# Patient Record
Sex: Female | Born: 1935 | Race: White | Hispanic: No | Smoking: Never smoker
Health system: Southern US, Community
[De-identification: ages and names within clinical notes are randomized; demographics above are authoritative.]

## PROBLEM LIST (undated history)

## (undated) DIAGNOSIS — F329 Major depressive disorder, single episode, unspecified: Secondary | ICD-10-CM

## (undated) DIAGNOSIS — I4892 Unspecified atrial flutter: Secondary | ICD-10-CM

## (undated) DIAGNOSIS — J449 Chronic obstructive pulmonary disease, unspecified: Secondary | ICD-10-CM

## (undated) DIAGNOSIS — I10 Essential (primary) hypertension: Secondary | ICD-10-CM

## (undated) DIAGNOSIS — I071 Rheumatic tricuspid insufficiency: Secondary | ICD-10-CM

## (undated) DIAGNOSIS — I779 Disorder of arteries and arterioles, unspecified: Secondary | ICD-10-CM

## (undated) DIAGNOSIS — I480 Paroxysmal atrial fibrillation: Secondary | ICD-10-CM

## (undated) DIAGNOSIS — I351 Nonrheumatic aortic (valve) insufficiency: Secondary | ICD-10-CM

## (undated) DIAGNOSIS — N289 Disorder of kidney and ureter, unspecified: Secondary | ICD-10-CM

## (undated) DIAGNOSIS — N390 Urinary tract infection, site not specified: Secondary | ICD-10-CM

## (undated) DIAGNOSIS — I272 Pulmonary hypertension, unspecified: Secondary | ICD-10-CM

## (undated) DIAGNOSIS — K589 Irritable bowel syndrome without diarrhea: Secondary | ICD-10-CM

## (undated) DIAGNOSIS — R42 Dizziness and giddiness: Secondary | ICD-10-CM

## (undated) DIAGNOSIS — K219 Gastro-esophageal reflux disease without esophagitis: Secondary | ICD-10-CM

## (undated) DIAGNOSIS — N182 Chronic kidney disease, stage 2 (mild): Secondary | ICD-10-CM

## (undated) DIAGNOSIS — I447 Left bundle-branch block, unspecified: Secondary | ICD-10-CM

## (undated) DIAGNOSIS — I4891 Unspecified atrial fibrillation: Secondary | ICD-10-CM

## (undated) HISTORY — DX: Urinary tract infection, site not specified: N39.0

## (undated) HISTORY — DX: Unspecified atrial fibrillation (CMS-HCC): I48.91

## (undated) HISTORY — PX: BILATERAL SALPINGOOPHORECTOMY: SHX1223

## (undated) HISTORY — PX: HYSTERECTOMY: SHX81

## (undated) HISTORY — PX: CHOLECYSTECTOMY: SHX55

## (undated) HISTORY — DX: Major depressive disorder, single episode, unspecified: F32.9

## (undated) HISTORY — PX: OTHER PROCEDURE: U1053

## (undated) HISTORY — PX: OTHER SURGICAL HISTORY: SHX169

## (undated) HISTORY — DX: Nonrheumatic aortic (valve) insufficiency: I35.1

## (undated) HISTORY — DX: Disorder of arteries and arterioles, unspecified: I77.9

## (undated) HISTORY — DX: Rheumatic tricuspid insufficiency: I07.1

## (undated) HISTORY — PX: ABDOMINAL HYSTERECTOMY: SHX81

## (undated) HISTORY — DX: Pulmonary hypertension, unspecified: I27.20

## (undated) HISTORY — DX: Chronic kidney disease, stage 2 (mild): N18.2

## (undated) HISTORY — DX: Left bundle-branch block, unspecified: I44.7

## (undated) HISTORY — PX: JOINT REPLACEMENT: SHX530

## (undated) HISTORY — DX: Essential (primary) hypertension: I10

## (undated) HISTORY — DX: Paroxysmal atrial fibrillation: I48.0

## (undated) HISTORY — DX: Dizziness and giddiness: R42

---

## 2016-09-06 DIAGNOSIS — R05 Cough: Secondary | ICD-10-CM | POA: Diagnosis not present

## 2016-09-06 DIAGNOSIS — J101 Influenza due to other identified influenza virus with other respiratory manifestations: Secondary | ICD-10-CM | POA: Diagnosis not present

## 2016-09-06 DIAGNOSIS — J181 Lobar pneumonia, unspecified organism: Secondary | ICD-10-CM | POA: Diagnosis not present

## 2016-09-12 DIAGNOSIS — J181 Lobar pneumonia, unspecified organism: Secondary | ICD-10-CM | POA: Diagnosis not present

## 2016-09-12 DIAGNOSIS — R05 Cough: Secondary | ICD-10-CM | POA: Diagnosis not present

## 2016-09-15 DIAGNOSIS — R5383 Other fatigue: Secondary | ICD-10-CM | POA: Diagnosis not present

## 2016-09-15 DIAGNOSIS — J189 Pneumonia, unspecified organism: Secondary | ICD-10-CM | POA: Diagnosis not present

## 2016-09-15 DIAGNOSIS — M150 Primary generalized (osteo)arthritis: Secondary | ICD-10-CM | POA: Diagnosis not present

## 2016-09-15 DIAGNOSIS — R0609 Other forms of dyspnea: Secondary | ICD-10-CM | POA: Diagnosis not present

## 2016-09-15 DIAGNOSIS — I10 Essential (primary) hypertension: Secondary | ICD-10-CM | POA: Diagnosis not present

## 2016-09-29 DIAGNOSIS — J449 Chronic obstructive pulmonary disease, unspecified: Secondary | ICD-10-CM | POA: Diagnosis not present

## 2016-09-29 DIAGNOSIS — R5383 Other fatigue: Secondary | ICD-10-CM | POA: Diagnosis not present

## 2016-09-29 DIAGNOSIS — M1990 Unspecified osteoarthritis, unspecified site: Secondary | ICD-10-CM | POA: Diagnosis not present

## 2016-09-29 DIAGNOSIS — M150 Primary generalized (osteo)arthritis: Secondary | ICD-10-CM | POA: Diagnosis not present

## 2016-09-29 DIAGNOSIS — Z79810 Long term (current) use of selective estrogen receptor modulators (SERMs): Secondary | ICD-10-CM | POA: Diagnosis not present

## 2016-09-29 DIAGNOSIS — R634 Abnormal weight loss: Secondary | ICD-10-CM | POA: Diagnosis not present

## 2016-09-29 DIAGNOSIS — I10 Essential (primary) hypertension: Secondary | ICD-10-CM | POA: Diagnosis not present

## 2016-10-06 DIAGNOSIS — H8110 Benign paroxysmal vertigo, unspecified ear: Secondary | ICD-10-CM | POA: Diagnosis not present

## 2016-10-06 DIAGNOSIS — R0609 Other forms of dyspnea: Secondary | ICD-10-CM | POA: Diagnosis not present

## 2016-10-06 DIAGNOSIS — I10 Essential (primary) hypertension: Secondary | ICD-10-CM | POA: Diagnosis not present

## 2016-10-06 DIAGNOSIS — M150 Primary generalized (osteo)arthritis: Secondary | ICD-10-CM | POA: Diagnosis not present

## 2016-10-06 DIAGNOSIS — R296 Repeated falls: Secondary | ICD-10-CM | POA: Diagnosis not present

## 2016-10-06 DIAGNOSIS — N19 Unspecified kidney failure: Secondary | ICD-10-CM | POA: Diagnosis not present

## 2016-10-20 DIAGNOSIS — L603 Nail dystrophy: Secondary | ICD-10-CM | POA: Diagnosis not present

## 2016-10-20 DIAGNOSIS — M25775 Osteophyte, left foot: Secondary | ICD-10-CM | POA: Diagnosis not present

## 2016-10-20 DIAGNOSIS — M79671 Pain in right foot: Secondary | ICD-10-CM | POA: Diagnosis not present

## 2016-10-20 DIAGNOSIS — M79672 Pain in left foot: Secondary | ICD-10-CM | POA: Diagnosis not present

## 2016-10-20 DIAGNOSIS — M25774 Osteophyte, right foot: Secondary | ICD-10-CM | POA: Diagnosis not present

## 2016-10-20 DIAGNOSIS — M79676 Pain in unspecified toe(s): Secondary | ICD-10-CM | POA: Diagnosis not present

## 2016-10-20 DIAGNOSIS — M79674 Pain in right toe(s): Secondary | ICD-10-CM | POA: Diagnosis not present

## 2016-10-20 DIAGNOSIS — L600 Ingrowing nail: Secondary | ICD-10-CM | POA: Diagnosis not present

## 2016-10-20 DIAGNOSIS — M79675 Pain in left toe(s): Secondary | ICD-10-CM | POA: Diagnosis not present

## 2016-11-11 DIAGNOSIS — J209 Acute bronchitis, unspecified: Secondary | ICD-10-CM | POA: Diagnosis not present

## 2016-11-13 DIAGNOSIS — J209 Acute bronchitis, unspecified: Secondary | ICD-10-CM | POA: Diagnosis not present

## 2016-11-13 DIAGNOSIS — M150 Primary generalized (osteo)arthritis: Secondary | ICD-10-CM | POA: Diagnosis not present

## 2016-11-13 DIAGNOSIS — R296 Repeated falls: Secondary | ICD-10-CM | POA: Diagnosis not present

## 2016-11-13 DIAGNOSIS — N183 Chronic kidney disease, stage 3 (moderate): Secondary | ICD-10-CM | POA: Diagnosis not present

## 2016-11-13 DIAGNOSIS — R0609 Other forms of dyspnea: Secondary | ICD-10-CM | POA: Diagnosis not present

## 2016-11-13 DIAGNOSIS — I10 Essential (primary) hypertension: Secondary | ICD-10-CM | POA: Diagnosis not present

## 2016-12-20 DIAGNOSIS — H6120 Impacted cerumen, unspecified ear: Secondary | ICD-10-CM | POA: Diagnosis not present

## 2016-12-20 DIAGNOSIS — R51 Headache: Secondary | ICD-10-CM | POA: Diagnosis not present

## 2016-12-20 DIAGNOSIS — J309 Allergic rhinitis, unspecified: Secondary | ICD-10-CM | POA: Diagnosis not present

## 2016-12-20 DIAGNOSIS — R0982 Postnasal drip: Secondary | ICD-10-CM | POA: Diagnosis not present

## 2016-12-20 DIAGNOSIS — J343 Hypertrophy of nasal turbinates: Secondary | ICD-10-CM | POA: Diagnosis not present

## 2016-12-20 DIAGNOSIS — H9319 Tinnitus, unspecified ear: Secondary | ICD-10-CM | POA: Diagnosis not present

## 2016-12-20 DIAGNOSIS — J329 Chronic sinusitis, unspecified: Secondary | ICD-10-CM | POA: Diagnosis not present

## 2017-01-15 DIAGNOSIS — M9901 Segmental and somatic dysfunction of cervical region: Secondary | ICD-10-CM | POA: Diagnosis not present

## 2017-01-15 DIAGNOSIS — M542 Cervicalgia: Secondary | ICD-10-CM | POA: Diagnosis not present

## 2017-01-19 DIAGNOSIS — M542 Cervicalgia: Secondary | ICD-10-CM | POA: Diagnosis not present

## 2017-01-19 DIAGNOSIS — M9901 Segmental and somatic dysfunction of cervical region: Secondary | ICD-10-CM | POA: Diagnosis not present

## 2017-01-26 DIAGNOSIS — M9901 Segmental and somatic dysfunction of cervical region: Secondary | ICD-10-CM | POA: Diagnosis not present

## 2017-01-26 DIAGNOSIS — M542 Cervicalgia: Secondary | ICD-10-CM | POA: Diagnosis not present

## 2017-02-02 DIAGNOSIS — M9901 Segmental and somatic dysfunction of cervical region: Secondary | ICD-10-CM | POA: Diagnosis not present

## 2017-02-02 DIAGNOSIS — M542 Cervicalgia: Secondary | ICD-10-CM | POA: Diagnosis not present

## 2017-03-29 DIAGNOSIS — J45909 Unspecified asthma, uncomplicated: Secondary | ICD-10-CM | POA: Diagnosis not present

## 2017-03-29 DIAGNOSIS — H8110 Benign paroxysmal vertigo, unspecified ear: Secondary | ICD-10-CM | POA: Diagnosis not present

## 2017-03-29 DIAGNOSIS — R0609 Other forms of dyspnea: Secondary | ICD-10-CM | POA: Diagnosis not present

## 2017-03-29 DIAGNOSIS — I10 Essential (primary) hypertension: Secondary | ICD-10-CM | POA: Diagnosis not present

## 2017-03-29 DIAGNOSIS — Z79899 Other long term (current) drug therapy: Secondary | ICD-10-CM | POA: Diagnosis not present

## 2017-03-29 DIAGNOSIS — R5383 Other fatigue: Secondary | ICD-10-CM | POA: Diagnosis not present

## 2017-03-29 DIAGNOSIS — M1990 Unspecified osteoarthritis, unspecified site: Secondary | ICD-10-CM | POA: Diagnosis not present

## 2017-03-29 DIAGNOSIS — N19 Unspecified kidney failure: Secondary | ICD-10-CM | POA: Diagnosis not present

## 2017-04-30 DIAGNOSIS — J45909 Unspecified asthma, uncomplicated: Secondary | ICD-10-CM | POA: Diagnosis not present

## 2017-04-30 DIAGNOSIS — D510 Vitamin B12 deficiency anemia due to intrinsic factor deficiency: Secondary | ICD-10-CM | POA: Diagnosis not present

## 2017-04-30 DIAGNOSIS — R5383 Other fatigue: Secondary | ICD-10-CM | POA: Diagnosis not present

## 2017-04-30 DIAGNOSIS — I10 Essential (primary) hypertension: Secondary | ICD-10-CM | POA: Diagnosis not present

## 2017-04-30 DIAGNOSIS — N390 Urinary tract infection, site not specified: Secondary | ICD-10-CM | POA: Diagnosis not present

## 2017-04-30 DIAGNOSIS — M150 Primary generalized (osteo)arthritis: Secondary | ICD-10-CM | POA: Diagnosis not present

## 2017-04-30 DIAGNOSIS — R0609 Other forms of dyspnea: Secondary | ICD-10-CM | POA: Diagnosis not present

## 2017-05-18 ENCOUNTER — Telehealth (HOSPITAL_BASED_OUTPATIENT_CLINIC_OR_DEPARTMENT_OTHER): Payer: Self-pay | Admitting: Surgical

## 2017-05-18 ENCOUNTER — Telehealth (HOSPITAL_BASED_OUTPATIENT_CLINIC_OR_DEPARTMENT_OTHER): Payer: Self-pay | Admitting: Urology

## 2017-05-18 NOTE — Telephone Encounter (Signed)
Patient is asking to schedule an appt with Dr. Hetty Blend.  She states she has severe stabbing pains in her bladder and has to wear a diaper because urine will start coming out.  Please advise if patient can be scheduled?    She states this all started when she had a cystoscopy about 2 years ago outside of [place].

## 2017-05-18 NOTE — Telephone Encounter (Signed)
Erie Noe- please confirm reason for visit with Urology.  If pt has blood in her urine or urinary retention then pt can schedule with any urologist at [facility] or [facility].    If pt is only having bladder pain; then pt should see Rodman Pickle or Jaclynn Major for initial work up.      Also Erie Noe noted pt had cystoscopy previously, we will need all urology outside records.

## 2017-05-18 NOTE — Telephone Encounter (Addendum)
Patient has been scheduled with Rodman Pickle 05/28/17.  Pt and Pt's niece state they do not want to have anything to do with pt's previous Urologist Dr. Manson Passey.  They are is asking if clinic may obtain medical notes and cystoscopy from Dr. Theora Gianotti office.  They can sign a release with [place] if needed and ask if it may be emailed to niece to have pt sign and fax back to clinic. Please contact patient to let her know how this can be done, call center can also reach out to patient if needed.    Meriam Sprague, niece can be reached at [phone].    Release form can be emailed to [email or web address]      Dr. Manson Passey, [facility]    [address]  [place], [place] [address]  Ph: [phone]

## 2017-05-18 NOTE — Telephone Encounter (Signed)
Called Courtney Fowler to let her know she can go to [employer] and go to medical records to obtain release form.  Walked Courtney Fowler through website and told her she can call me back if she has any questions.

## 2017-05-18 NOTE — Telephone Encounter (Signed)
Per protocol, it;s new patients responsibility to obtain medical records before appointment. If there's no sign consent we are not able to assist with getting records.       Patient needs to contact Dr. Langley Gauss office or print the [place] medical records off the web site     Routing to [place] as [place].

## 2017-05-25 ENCOUNTER — Telehealth (HOSPITAL_BASED_OUTPATIENT_CLINIC_OR_DEPARTMENT_OTHER): Payer: Self-pay | Admitting: Surgical

## 2017-05-25 ENCOUNTER — Encounter (HOSPITAL_BASED_OUTPATIENT_CLINIC_OR_DEPARTMENT_OTHER): Payer: Self-pay | Admitting: Surgical

## 2017-05-25 ENCOUNTER — Ambulatory Visit: Payer: Medicare Other | Attending: Surgical | Admitting: Surgical

## 2017-05-25 VITALS — BP 120/43 | HR 71 | Temp 97.6°F

## 2017-05-25 DIAGNOSIS — N390 Urinary tract infection, site not specified: Secondary | ICD-10-CM | POA: Insufficient documentation

## 2017-05-25 DIAGNOSIS — R3989 Other symptoms and signs involving the genitourinary system: Secondary | ICD-10-CM | POA: Insufficient documentation

## 2017-05-25 DIAGNOSIS — N3946 Mixed incontinence: Secondary | ICD-10-CM | POA: Diagnosis not present

## 2017-05-25 DIAGNOSIS — N952 Postmenopausal atrophic vaginitis: Secondary | ICD-10-CM | POA: Diagnosis not present

## 2017-05-25 LAB — URINALYSIS WITH CULTURE REFLEX, WHEN INDICATED
Bilirubin: NEGATIVE
Glucose: NEGATIVE
Ketones: NEGATIVE
Nitrite: NEGATIVE
RBC: >50 — AB (ref 0–?)
Specific Gravity: 1.012 (ref 1.002–1.030)
Urobilinogen: NEGATIVE
WBC: >50 — AB (ref 0–?)
pH: 6 (ref 5.0–8.0)

## 2017-05-25 MED ORDER — XARELTO PO: ORAL | Status: AC

## 2017-05-25 MED ORDER — ESCITALOPRAM OXALATE 20 MG OR TABS: 20.00 mg | ORAL_TABLET | Freq: Every day | ORAL | Status: AC

## 2017-05-25 MED ORDER — CEPHALEXIN 500 MG OR CAPS: 500.00 mg | ORAL_CAPSULE | Freq: Four times a day (QID) | ORAL | Status: AC

## 2017-05-25 MED ORDER — ESTRADIOL 0.1 MG/GM VA CREA
TOPICAL_CREAM | VAGINAL | 11 refills | Status: AC
Start: 2017-05-25 — End: ?

## 2017-05-25 NOTE — Progress Notes (Signed)
INITIAL UROLOGY CONSULTATION    CC: LUTS, UTIs    History of Present Illness:  Courtney Fowler is a 83 year old female who was referred by Self, Referred is here today for evaluation of LUTS, UTIs    Patient states that she began having urinary incontinence.  Was sent to see Dr. Langley Gauss 2 years ago.  She had a cystoscopy there and states that it was excessively painful.      Pt states that she has had stress incontinence for years - previously only with coughing and sneezing.  After her cystoscopy she needed to wear pull ups and pads inside.  She leaks now not only with stress but also with urge.  Sometimes doesn't even know when she is about to leak.      Changes diaper/pad 3 times a day and they are soaked.  She does wear one at night and when she wakes up it is soaked.  She does get urgency and frequency during the day but as soon as she stands up to go urinate she leaks.      She has been having urethral pain since the cystoscopy.  Sometimes can be stabbing pains that make her halt.  Sometimes has burning with urination.  She does use a topical barrier cream which helps.  Prior to the cystoscopy she never had this kind of pain.  No blood in the urine. She does not know the results of the cystoscopy.      Patient is also bothered by urinary tract infections.  She has had positive urine cutlures per their report.  Just finished a course of keflex.  Dr. Lorna Few, her PCP treats her for these.  She previously was on ppx abx.  She previously tried vaginal estrogen but this was years ago.    She has been given myrbetriq but doesn't think this helps her.  She has been taking it for 2 years.     When her youngest daughter was born, she was told she had some type of bladder suspension procedure.     G4P3, all vaginal deliveries.  Incontinence started may years later.        PMH:  Past Medical History:   Diagnosis Date    AF (atrial fibrillation) (CMS-HCC)     Major depressive disorder, single episode      UTI (urinary tract infection)      There is no problem list on file for this patient.    PSH:  Past Surgical History:   Procedure Laterality Date    BILATERAL SALPINGOOPHORECTOMY      brain cyst removal      CHOLECYSTECTOMY      hip replacement      HYSTERECTOMY       Allergies   Allergen Reactions    Levaquin [Levofloxacin] Itching    Sulfa Drugs Itching     Social History     Social History    Marital status: Widowed     Spouse name: N/A    Number of children: N/A    Years of education: N/A     Occupational History    Not on file.     Social History Main Topics    Smoking status: Never Smoker    Smokeless tobacco: Never Used    Alcohol use Not on file    Drug use: Not on file    Sexual activity: Not on file     Other Topics Concern    Not  on file     Social History Narrative    No narrative on file     Family History   Problem Relation Age of Onset    Heart Disease Mother     Cancer Mother      leukemia    Stroke Father     Cancer Brother      esophageal    Heart Disease Brother          Current Outpatient Prescriptions   Medication Sig    cephALEXin (KEFLEX) 500 MG capsule Take 500 mg by mouth 4 times daily.    escitalopram (LEXAPRO) 20 MG tablet Take 20 mg by mouth daily.    estradiol (ESTRACE VAGINAL) 0.1 MG/GM vaginal cream Use every day for 2 weeks and then three times per week thereafter    Rivaroxaban (XARELTO PO)      No current facility-administered medications for this visit.         Review of Systems:   A complete ROS was performed and is negative except as documented above in HPI.      Physical Exam:   Vitals:    05/25/17 0844   BP: 120/43   BP Location: Left arm   BP Patient Position: Sitting   BP cuff size: Large   Pulse: 71   Temp: 97.6 F (36.4 C)     GENERAL: The patient is an alert, cooperative female in no acute distress.   HEAD/NECK: normalcephalic/atraumatic; midline trachea.   PULM: unlabored breathing on room air without coughing or wheezing  GI: soft, NT, ND, no  masses or hepatosplenomegaly appreciated  BACK: no CVAT or midline tenderness  EXTREMITIES: No redness or swelling.   SKIN: There is no edema or cyanosis.   NEURO: Alert and Oriented x 3, normal gait, ambulates without assistive device.  GU FEMALE: Normal external genitalia without rash or lesions.  Loss of labia minora and small introitus, mucosal changes consistent with severe mucosal atrophy, Urethral meatus noted in normal anatomic position.  Manual exam painful for patient, no masses.     Results:   PVR: 54mL  All pertinent lab results and imaging have been reviewed with this patient.      Assessment and Plan:    ICD-10-CM ICD-9-CM   1. Vaginal atrophy N95.2 627.3   2. Recurrent UTI N39.0 599.0   3. Mixed incontinence N39.46 41.33     83 y/o F with h/o recurrent UTIs, mixed urinary incontinence that is worse over the last two years, here for evaluation.  We discussed today that she likely is having both stress and urge incontinence, though at this time I am not sure which is the primary.  As she has failed a trial of mirabegron and doesn't feel this has helped at all, I recommended a urodynamic study although after describing the procedure patient states she thinks she had this with Dr. Irving Burton office.  Will get these records so as not to repeat the study if not needed.     In the interim, given her urethral and vaginal pain and recurrent UTIs, recommend restarting vaginal estrogen.  She tells me that in the past she used Estring and liked it but based on her exam today I'm not sure she would tolerate the placement of the Estring at this time.  Recommend Estrace for 6 weeks and will re-examine. If able to tolerate manual exam, can switch to Estring.     We discussed today that vaginal estrogen can improve symptoms of  urinary urgency and urgency incontinence as well.  This probably will not keep her dry but is a tool we can use.  On that note, we discussed that I do not want to promise that I will get her to be  dry, but that we will work toward improving her incontinence as much as possible.      Finally, placed a standing urine culture for patient, they will get this done locally in [place] and send me a message to let me know when to look out for results.     Plan:  Standing urine cutlure  Trial of Estrace  Obtain records from Dr. Theora Gianotti office  RTO in 6 weeks    Tonny Branch. Juliane Poot    [facility]  Urology [facility]  [address]  [place], [place] [address]

## 2017-05-25 NOTE — Addendum Note (Signed)
Addended by: Linward Headland on: 05/25/2017 03:16 PM     Modules accepted: Orders

## 2017-05-25 NOTE — Telephone Encounter (Signed)
Received message from PA. I contacted Dr. Janet Berlin office: [phone]. Requested for office visit notes, Cysto and UDS results.    Records have been received and under media for PA to review.

## 2017-05-28 ENCOUNTER — Telehealth (HOSPITAL_BASED_OUTPATIENT_CLINIC_OR_DEPARTMENT_OTHER): Payer: Self-pay | Admitting: Surgical

## 2017-05-28 ENCOUNTER — Ambulatory Visit (HOSPITAL_BASED_OUTPATIENT_CLINIC_OR_DEPARTMENT_OTHER): Payer: Medicare Other | Admitting: Surgical

## 2017-05-28 DIAGNOSIS — N3000 Acute cystitis without hematuria: Principal | ICD-10-CM

## 2017-05-28 LAB — URINE CULTURE

## 2017-05-28 MED ORDER — NITROFURANTOIN MONOHYD MACRO 100 MG OR CAPS
100.0000 mg | ORAL_CAPSULE | Freq: Two times a day (BID) | ORAL | 0 refills | Status: AC
Start: 2017-05-28 — End: ?

## 2017-05-28 NOTE — Telephone Encounter (Signed)
LVM for patient to call back to discuss UCX results.    If patient calls back - nursing to please relay following information:    Urine culture positive.  If patient is feeling well without UTI symptoms, no need for abx, however if she is having UTI symptoms, will prescribe abx to her pharmacy

## 2017-06-05 DIAGNOSIS — N19 Unspecified kidney failure: Secondary | ICD-10-CM | POA: Diagnosis not present

## 2017-06-05 DIAGNOSIS — E785 Hyperlipidemia, unspecified: Secondary | ICD-10-CM | POA: Diagnosis not present

## 2017-06-05 DIAGNOSIS — I10 Essential (primary) hypertension: Secondary | ICD-10-CM | POA: Diagnosis not present

## 2017-06-05 DIAGNOSIS — J209 Acute bronchitis, unspecified: Secondary | ICD-10-CM | POA: Diagnosis not present

## 2017-06-05 DIAGNOSIS — M150 Primary generalized (osteo)arthritis: Secondary | ICD-10-CM | POA: Diagnosis not present

## 2017-06-05 DIAGNOSIS — R0609 Other forms of dyspnea: Secondary | ICD-10-CM | POA: Diagnosis not present

## 2017-06-05 DIAGNOSIS — D649 Anemia, unspecified: Secondary | ICD-10-CM | POA: Diagnosis not present

## 2017-06-05 DIAGNOSIS — H8110 Benign paroxysmal vertigo, unspecified ear: Secondary | ICD-10-CM | POA: Diagnosis not present

## 2017-06-05 DIAGNOSIS — N390 Urinary tract infection, site not specified: Secondary | ICD-10-CM | POA: Diagnosis not present

## 2017-06-08 ENCOUNTER — Telehealth (HOSPITAL_BASED_OUTPATIENT_CLINIC_OR_DEPARTMENT_OTHER): Payer: Self-pay | Admitting: Surgical

## 2017-06-08 NOTE — Telephone Encounter (Signed)
Pt calling, states she believes Fleet Contras asked pt to schedule a cysto. There is no mention in last OV note 05/25/17. Please advise pt [phone]

## 2017-06-08 NOTE — Telephone Encounter (Signed)
Per LOV with Fleet Contras, [place] 05/25/17:  "83 y/o F with h/o recurrent UTIs, mixed urinary incontinence that is worse over the last two years, here for evaluation.  We discussed today that she likely is having both stress and urge incontinence, though at this time I am not sure which is the primary.  As she has failed a trial of mirabegron and doesn't feel this has helped at all, I recommended a urodynamic study although after describing the procedure patient states she thinks she had this with Dr. Irving Burton office.  Will get these records so as not to repeat the study if not needed."    There is UDS results in media on 05/25/17 from Dr. Theora Gianotti office. This may be what pt is referring to.

## 2017-06-12 DIAGNOSIS — H04123 Dry eye syndrome of bilateral lacrimal glands: Secondary | ICD-10-CM | POA: Diagnosis not present

## 2017-06-19 ENCOUNTER — Telehealth (HOSPITAL_BASED_OUTPATIENT_CLINIC_OR_DEPARTMENT_OTHER): Payer: Self-pay | Admitting: Surgical

## 2017-06-19 NOTE — Telephone Encounter (Signed)
Tried to call patient to relay PA Shapiro's message but no answer. Will try to call patient again later.

## 2017-06-19 NOTE — Telephone Encounter (Signed)
Spoke to Patient and relayed PA Shapiro's message   Hi - please let patient know I don't think we need to do a cystoscopy yet. I wanted to see her back after I received the records from Dr. Theora Gianotti office and after she tried the estradiol for a few weeks. She is to see me back on 11/2 at which time we will discuss next steps.

## 2017-06-20 DIAGNOSIS — J343 Hypertrophy of nasal turbinates: Secondary | ICD-10-CM | POA: Diagnosis not present

## 2017-06-20 DIAGNOSIS — R0982 Postnasal drip: Secondary | ICD-10-CM | POA: Diagnosis not present

## 2017-06-20 DIAGNOSIS — H6123 Impacted cerumen, bilateral: Secondary | ICD-10-CM | POA: Diagnosis not present

## 2017-06-20 DIAGNOSIS — R040 Epistaxis: Secondary | ICD-10-CM | POA: Diagnosis not present

## 2017-06-20 DIAGNOSIS — H9319 Tinnitus, unspecified ear: Secondary | ICD-10-CM | POA: Diagnosis not present

## 2017-06-20 DIAGNOSIS — J309 Allergic rhinitis, unspecified: Secondary | ICD-10-CM | POA: Diagnosis not present

## 2017-06-20 DIAGNOSIS — J329 Chronic sinusitis, unspecified: Secondary | ICD-10-CM | POA: Diagnosis not present

## 2017-07-02 DIAGNOSIS — B351 Tinea unguium: Secondary | ICD-10-CM | POA: Diagnosis not present

## 2017-07-02 DIAGNOSIS — M79672 Pain in left foot: Secondary | ICD-10-CM | POA: Diagnosis not present

## 2017-07-02 DIAGNOSIS — L851 Acquired keratosis [keratoderma] palmaris et plantaris: Secondary | ICD-10-CM | POA: Diagnosis not present

## 2017-07-02 DIAGNOSIS — M79671 Pain in right foot: Secondary | ICD-10-CM | POA: Diagnosis not present

## 2017-07-03 DIAGNOSIS — R0609 Other forms of dyspnea: Secondary | ICD-10-CM | POA: Diagnosis not present

## 2017-07-03 DIAGNOSIS — R5383 Other fatigue: Secondary | ICD-10-CM | POA: Diagnosis not present

## 2017-07-03 DIAGNOSIS — Z23 Encounter for immunization: Secondary | ICD-10-CM | POA: Diagnosis not present

## 2017-07-03 DIAGNOSIS — H8110 Benign paroxysmal vertigo, unspecified ear: Secondary | ICD-10-CM | POA: Diagnosis not present

## 2017-07-03 DIAGNOSIS — N19 Unspecified kidney failure: Secondary | ICD-10-CM | POA: Diagnosis not present

## 2017-07-03 DIAGNOSIS — I4892 Unspecified atrial flutter: Secondary | ICD-10-CM | POA: Diagnosis not present

## 2017-07-03 DIAGNOSIS — M150 Primary generalized (osteo)arthritis: Secondary | ICD-10-CM | POA: Diagnosis not present

## 2017-07-03 DIAGNOSIS — D510 Vitamin B12 deficiency anemia due to intrinsic factor deficiency: Secondary | ICD-10-CM | POA: Diagnosis not present

## 2017-07-03 DIAGNOSIS — Z79899 Other long term (current) drug therapy: Secondary | ICD-10-CM | POA: Diagnosis not present

## 2017-07-03 DIAGNOSIS — I10 Essential (primary) hypertension: Secondary | ICD-10-CM | POA: Diagnosis not present

## 2017-07-06 ENCOUNTER — Encounter (HOSPITAL_BASED_OUTPATIENT_CLINIC_OR_DEPARTMENT_OTHER): Payer: Self-pay | Admitting: Surgical

## 2017-07-06 ENCOUNTER — Ambulatory Visit: Payer: Medicare Other | Attending: Surgical | Admitting: Surgical

## 2017-07-06 VITALS — BP 131/62 | HR 85 | Temp 97.6°F | Resp 16 | Ht 63.5 in | Wt 135.0 lb

## 2017-07-06 DIAGNOSIS — R300 Dysuria: Secondary | ICD-10-CM | POA: Insufficient documentation

## 2017-07-06 DIAGNOSIS — N3941 Urge incontinence: Principal | ICD-10-CM | POA: Insufficient documentation

## 2017-07-06 DIAGNOSIS — N393 Stress incontinence (female) (male): Secondary | ICD-10-CM | POA: Diagnosis not present

## 2017-07-06 DIAGNOSIS — N390 Urinary tract infection, site not specified: Secondary | ICD-10-CM | POA: Diagnosis not present

## 2017-07-06 DIAGNOSIS — N952 Postmenopausal atrophic vaginitis: Secondary | ICD-10-CM | POA: Diagnosis not present

## 2017-07-06 MED ORDER — MIRABEGRON ER 50 MG PO TB24
50.00 mg | ORAL_TABLET | Freq: Every day | ORAL | 11 refills | Status: AC
Start: 2017-07-06 — End: ?

## 2017-07-06 NOTE — Progress Notes (Signed)
UROLOGY CLINIC NOTE     CC: LUTS, UTIs    History of Present Illness:  Courtney Fowler is a 83 year old female who is here today for continued evaluation of LUTS, UTIs    Patient bothered by mixed incontinence.   States she has had small volume stress incontinence for many years, had a bladder suspension procedure after birth of her youngest daughter.  In the last few years began developing more urgency incontinence.  Went to see Dr. Langley Gauss 02/2016.  She states that she had urodynamics and a cystoscopy.  The cystoscopy she describes as excessivly painful and feels that her incontinence worsened exponentially after this.  She was prescribed mirabegron by Dr. Manson Passey and has been taking 25mg  since that visit but she has not been back.  She doesn't feel that this helps her, but she isn't sure.    Changes diaper/pad 3 times a day and they are soaked.  She does wear one at night and when she wakes up it is soaked.  She does get urgency and frequency during the day but as soon as she stands up to go urinate she leaks.      She has been having urethral pain since the cystoscopy.  Sometimes can be stabbing pains that make her halt.  Sometimes has burning with urination.  She does use a topical barrier cream which helps.  Prior to the cystoscopy she never had this kind of pain.  No gross blood in the urine.     Patient is also bothered by urinary tract infections.  She has had positive urine cutlures per their report.  Just finished a course of keflex.  Dr. Lorna Few, her PCP treats her for these.  She previously was on ppx abx.  She previously tried vaginal estrogen but this was years ago.  She cannot tell me whether her symptoms improve on antibiotics.      Interval History  Last visit patient was started on vaginal estrogen.  She states she has been using the vaginal estrogen.  She was given a prescription for daily macrobid. She previously was on daily macrobid but doesn't recall if this helped.  She saw her  primary care doctor on Tuesday an was told to restart the daily macrobid although a urinalysis or culture were not done.      I received the records from Dr. Theora Gianotti office.  Pt had UDS performed 02/28/2016 - tracing revealed DO with   Incontinence and low capacity bladder (MCC 152cc), no stress incontinence noted.  She was filled at 30mL/min and the PFS is not included on the tracing.     Cystoscopy was also performed which was negative except for some mild posterior wall erythema.    She is still bothered by incontinence.  She can usually go 2-3 hours without urinating.  She is more bothered by large volume urgency incontinence.  Now only using a diaper and pad at night.    She is still not sure that mirabegron is helping her.    PMH:  Past Medical History:   Diagnosis Date    AF (atrial fibrillation) (CMS-HCC)     Major depressive disorder, single episode     UTI (urinary tract infection)      There is no problem list on file for this patient.    PSH:  Past Surgical History:   Procedure Laterality Date    BILATERAL SALPINGOOPHORECTOMY      brain cyst removal  CHOLECYSTECTOMY      hip replacement      HYSTERECTOMY       Allergies   Allergen Reactions    Levaquin [Levofloxacin] Itching    Sulfa Drugs Itching     Social History     Social History    Marital status: Widowed     Spouse name: N/A    Number of children: N/A    Years of education: N/A     Occupational History    Not on file.     Social History Main Topics    Smoking status: Never Smoker    Smokeless tobacco: Never Used    Alcohol use Not on file    Drug use: Not on file    Sexual activity: Not on file     Other Topics Concern    Not on file     Social History Narrative     Family History   Problem Relation Age of Onset    Heart Disease Mother     Cancer Mother      leukemia    Stroke Father     Cancer Brother      esophageal    Heart Disease Brother          Current Outpatient Prescriptions   Medication Sig    cephALEXin  (KEFLEX) 500 MG capsule Take 500 mg by mouth 4 times daily.    escitalopram (LEXAPRO) 20 MG tablet Take 20 mg by mouth daily.    estradiol (ESTRACE VAGINAL) 0.1 MG/GM vaginal cream Use every day for 2 weeks and then three times per week thereafter    mirabegron (MYRBETRIQ) 50 MG ER tablet Take 50 mg by mouth daily.    nitrofurantoin monohydrate (MACROBID) 100 MG capsule Take 1 capsule (100 mg) by mouth 2 times daily.    Rivaroxaban (XARELTO PO)      No current facility-administered medications for this visit.         Review of Systems:   A complete ROS was performed and is negative except as documented above in HPI.      Physical Exam:   Vitals:    07/06/17 1027   BP: 131/62   BP Patient Position: Sitting   BP cuff size: Regular   Pulse: 85   Resp: 16   Temp: 97.6 F (36.4 C)   TempSrc: Oral   Weight: 61.2 kg (135 lb)   Height: 5' 3.5" (1.613 m)     GENERAL: The patient is an alert, elderly, cooperative female in no acute distress.   HEAD/NECK: normalcephalic/atraumatic; midline trachea.   PULM: unlabored breathing on room air without coughing or wheezing  GI: soft, ND  EXTREMITIES: No redness or swelling.   SKIN: There is no edema or cyanosis.   NEURO: Alert and Oriented x 3, normal gait, ambulates without assistive device.    Results:   All pertinent lab results and imaging have been reviewed with this patient.      Assessment and Plan:    ICD-10-CM ICD-9-CM   1. Urgency incontinence N39.41 788.31   2. Stress incontinence N39.3 [id number]   3. Dysuria R30.0 788.1   4. Recurrent UTI N39.0 599.0   5. Atrophic vaginitis N95.2 627.3     83 y/o F with h/o recurrent UTIs, mixed urinary incontinence that is worse over the last two years, here for evaluation.  I had a lengthy discussion with patient and her family member today regarding her symptoms, potential etiologies and plans for  therapy.  We reviewed that at this time I am unclear as to whether her urinary tract infections are truly infections vs. Bacteriuria and  baseline irritative symptoms.  We discussed that this can take time to evaluate further and that for now she can continue on the daily macrobid but ultimately this is not a great long-term strategy.  I would like to have records of any urinalyses and urine cultures that are done so that we can monitor.  She was asked to leave a urine sample today but she was unable to - will go to [facility].     We reviewed that as she is most bothered by urgency incontinence, I would recommend continued treatment for this.  We discussed a trial off mirabegron to see if helping vs. Increasing the dose to see if she can get additional benefit.  She would like to increase the dose.  We also discussed other OAB therapies including addition of an anticholinergic medication for synergistic effect, PTNS, Interstim, and bladder botox.      Family member is very concerned that "damage" was done during her cystoscopy at Dr. Theora Gianotti office.  Reassured patient that it does not sound that way and at least from my external exam everything appeared normal.  I do not believe that a repeat cystoscopy is warranted at this time, particularly given how uncomfortable this procedure was for her in the past.  Would only repeat for cause and today I do not think that any findings would change my management.    Going forward, if we try a few different therapies and patient is not improving, would like to repeat her urodynamics study as her prior tracing did not include a PFS or information on emptying.     Finally, patient has a nephew with a connection to DR. Hetty Blend.  They would like to see her for their follow up visit.    Plan:  Continue vaginal estrogen  Urinalysis and urine culture at [facility]  Increase mirabegron to 50mg   RTO with voiding diary in 6 weeks     I spent over 45 min with this patinet, more than 50% of that time was spent in direct face to face counseling.    Tonny Branch. Juliane Poot     [facility]  Urology [facility]  [address], [address]  [place], [place] [address]

## 2017-07-11 DIAGNOSIS — N3941 Urge incontinence: Secondary | ICD-10-CM | POA: Diagnosis not present

## 2017-07-18 DIAGNOSIS — D500 Iron deficiency anemia secondary to blood loss (chronic): Secondary | ICD-10-CM | POA: Diagnosis not present

## 2017-07-18 DIAGNOSIS — K909 Intestinal malabsorption, unspecified: Secondary | ICD-10-CM | POA: Diagnosis not present

## 2017-07-25 ENCOUNTER — Telehealth (HOSPITAL_BASED_OUTPATIENT_CLINIC_OR_DEPARTMENT_OTHER): Payer: Self-pay

## 2017-07-25 NOTE — Telephone Encounter (Signed)
Spoke to pt's care-giver & discussed fact that void diary & past UCX records would be helpful at upcoming visit with Dr. Hetty BlendBuckley.

## 2017-07-31 ENCOUNTER — Telehealth (HOSPITAL_BASED_OUTPATIENT_CLINIC_OR_DEPARTMENT_OTHER): Payer: Self-pay | Admitting: Surgical

## 2017-07-31 NOTE — Telephone Encounter (Signed)
Pts EC Courtney Fowler is calling.  Pt saw PA Nile RiggsShapiro on 11/2 and was advised to return after completing a voiding diary.    Pt is currently scheduled 12/12.  Courtney Fowler asks that RN staff contact pt to discuss how to keep diary as pt is unsure of how to do it and for how long to do it.    Best contact ph  [phone]

## 2017-07-31 NOTE — Telephone Encounter (Signed)
Called and spoke to patient using 2 identifiers. Patient was instructed to keep a record/log of time and date of every intake and output for 2 days. Patient was also instructed to measure them accordingly.   Patient agreed and verbalized understanding.

## 2017-08-03 DIAGNOSIS — J181 Lobar pneumonia, unspecified organism: Secondary | ICD-10-CM | POA: Diagnosis not present

## 2017-08-03 DIAGNOSIS — I10 Essential (primary) hypertension: Secondary | ICD-10-CM | POA: Diagnosis not present

## 2017-08-03 DIAGNOSIS — R0609 Other forms of dyspnea: Secondary | ICD-10-CM | POA: Diagnosis not present

## 2017-08-03 DIAGNOSIS — D510 Vitamin B12 deficiency anemia due to intrinsic factor deficiency: Secondary | ICD-10-CM | POA: Diagnosis not present

## 2017-08-03 DIAGNOSIS — N19 Unspecified kidney failure: Secondary | ICD-10-CM | POA: Diagnosis not present

## 2017-08-03 DIAGNOSIS — R5383 Other fatigue: Secondary | ICD-10-CM | POA: Diagnosis not present

## 2017-08-03 DIAGNOSIS — R05 Cough: Secondary | ICD-10-CM | POA: Diagnosis not present

## 2017-08-03 DIAGNOSIS — H8110 Benign paroxysmal vertigo, unspecified ear: Secondary | ICD-10-CM | POA: Diagnosis not present

## 2017-08-03 DIAGNOSIS — E785 Hyperlipidemia, unspecified: Secondary | ICD-10-CM | POA: Diagnosis not present

## 2017-08-15 ENCOUNTER — Ambulatory Visit: Payer: Medicare Other | Attending: Urology | Admitting: Urology

## 2017-08-15 ENCOUNTER — Encounter (HOSPITAL_BASED_OUTPATIENT_CLINIC_OR_DEPARTMENT_OTHER): Payer: Self-pay | Admitting: Urology

## 2017-08-15 VITALS — BP 113/49 | HR 75 | Temp 98.0°F | Resp 16 | Ht 63.5 in | Wt 135.0 lb

## 2017-08-15 DIAGNOSIS — N3941 Urge incontinence: Secondary | ICD-10-CM | POA: Diagnosis not present

## 2017-08-15 DIAGNOSIS — N3281 Overactive bladder: Secondary | ICD-10-CM | POA: Diagnosis not present

## 2017-08-15 DIAGNOSIS — N390 Urinary tract infection, site not specified: Secondary | ICD-10-CM | POA: Diagnosis not present

## 2017-08-15 DIAGNOSIS — N952 Postmenopausal atrophic vaginitis: Secondary | ICD-10-CM | POA: Diagnosis not present

## 2017-08-15 NOTE — Progress Notes (Signed)
PCP: No Pcp, Per Patient  Date Today: 08/15/17     Chief complaint: LUTS, UTIs    HPI:   Nile RiggsJonita S Cheek-Eurich is a 83 year old female here for follow-up of LUTS and UTIs. Last saw Rodman PickleRachel Shapiro in 07/2017.  She conducted a very thorough evaluation which noted mixed urinary incontinence and possible recurrent UTIs vs bacteruria (currently on macrobid prophylaxis and vaginal estrogen).      Evaluation with Dr. Manson PasseyBrown (her prior urologist) included UDS and cysto.  He prescribed a trial of mirabegron which was not especially helpful. UDS showed DO and low capacity bladder. Cysto negative with exception of some posterior wall erythema which he did not feel warranted a biopsy.     Current symptoms: Notes using 2-3 pads per day and a brief at night.  Wakes quite wet.  She has severe dysuria with voiding.  Vaginal estrogen helped temporarily. She stopped empiric macrobid recently at the advice of her PCP due to renal insufficiency and the need for alternate antibiotic during treatment of a recent PNA.  Her goals of treatment are to first address her dysuria that has severely worsened after cystoscopy.     Medications:  Current Outpatient Medications on File Prior to Visit   Medication Sig Dispense Refill    cephALEXin (KEFLEX) 500 MG capsule Take 500 mg by mouth 4 times daily.      escitalopram (LEXAPRO) 20 MG tablet Take 20 mg by mouth daily.      estradiol (ESTRACE VAGINAL) 0.1 MG/GM vaginal cream Use every day for 2 weeks and then three times per week thereafter 1 Tube 11    mirabegron (MYRBETRIQ) 50 MG ER tablet Take 50 mg by mouth daily. 30 tablet 11    nitrofurantoin monohydrate (MACROBID) 100 MG capsule Take 1 capsule (100 mg) by mouth 2 times daily. 14 capsule 0    Rivaroxaban (XARELTO PO)        No current facility-administered medications on file prior to visit.      OBJECTIVE:  Vital Signs:  BP 113/49 (BP Location: Left arm, BP Patient Position: Sitting, BP cuff size: Regular)    Pulse 75    Temp 98 F  (36.7 C) (Oral)    Resp 16    Ht 5' 3.5" (1.613 m)    Wt 61.2 kg (135 lb)    SpO2 95%    BMI 23.54 kg/m     Physical:    GENERAL: Pleasant, cooperative and in no acute distress.   NEURO: Alert and Oriented x 3  HEENT: normalcephalic/atraumatic  NECK: supple, No LAD  RESP: non-labored breathing, no wheezing  BACK: No CVAT  GU: unchanged from last visit, cloudy urine  EXT: warm, no edema     ASSESSMENT:   83 year old female here for follow-up of mixed urinary incontinence and recurrent UTIs vs bacteruria.     PLAN:     - Will refill vaginal estrogen as it was helpful when used for dysuria.      I personally reviewed the patient's history, and interviewed and examined the patient. I agree with the documentation completed by the fellow. I reviewed her evaluation up until this point and agree with the course of action. She is currently trying second line therapy for OAB and some UI. She had a bad experience with a prior cystoscopy. At this point we will not repeat her cystoscopy. If she desire to have this repeated, I would be happy to do this in the future.  My sense is, she will likely require 3rd line OAB therapy.    Cristy FolksJill C. Hetty BlendBuckley, MD FACS

## 2017-08-16 ENCOUNTER — Telehealth (HOSPITAL_BASED_OUTPATIENT_CLINIC_OR_DEPARTMENT_OTHER): Payer: Self-pay | Admitting: Urology

## 2017-08-16 NOTE — Telephone Encounter (Signed)
Faxed to PCP

## 2017-08-16 NOTE — Telephone Encounter (Signed)
Patient is requesting urine test results that she brought in to her appointment with Dr Hetty BlendBuckley on 12/12 to be sent to her PCP, these are needed in order for her to get a refill on her medication     Please fax urine test to Dr Sabra Heckavid Chane   Fax # [phone]    If possible Patient is requesting this to be done this morning     Thank you

## 2017-08-17 ENCOUNTER — Encounter (HOSPITAL_BASED_OUTPATIENT_CLINIC_OR_DEPARTMENT_OTHER): Payer: Medicare Other | Admitting: Urology

## 2017-08-17 NOTE — Telephone Encounter (Signed)
Patient was informed by her PCP tht the UA results were not received. She is requesting at copy sent to her email. [email or web address] so that she can provide it to her PCP.     Preferred phone [phone]

## 2017-08-20 NOTE — Telephone Encounter (Signed)
E-mailed pt that no urine was sent at her app't 12/12 & that if she is experioencing Sx's to please call.

## 2017-08-23 DIAGNOSIS — R3981 Functional urinary incontinence: Secondary | ICD-10-CM | POA: Diagnosis not present

## 2017-08-23 DIAGNOSIS — D510 Vitamin B12 deficiency anemia due to intrinsic factor deficiency: Secondary | ICD-10-CM | POA: Diagnosis not present

## 2017-08-23 DIAGNOSIS — H8110 Benign paroxysmal vertigo, unspecified ear: Secondary | ICD-10-CM | POA: Diagnosis not present

## 2017-08-23 DIAGNOSIS — J449 Chronic obstructive pulmonary disease, unspecified: Secondary | ICD-10-CM | POA: Diagnosis not present

## 2017-08-23 DIAGNOSIS — I4892 Unspecified atrial flutter: Secondary | ICD-10-CM | POA: Diagnosis not present

## 2017-08-23 DIAGNOSIS — Z79899 Other long term (current) drug therapy: Secondary | ICD-10-CM | POA: Diagnosis not present

## 2017-08-23 DIAGNOSIS — I10 Essential (primary) hypertension: Secondary | ICD-10-CM | POA: Diagnosis not present

## 2017-08-23 DIAGNOSIS — M150 Primary generalized (osteo)arthritis: Secondary | ICD-10-CM | POA: Diagnosis not present

## 2017-08-23 DIAGNOSIS — M1990 Unspecified osteoarthritis, unspecified site: Secondary | ICD-10-CM | POA: Diagnosis not present

## 2017-09-12 DIAGNOSIS — M150 Primary generalized (osteo)arthritis: Secondary | ICD-10-CM | POA: Diagnosis not present

## 2017-09-12 DIAGNOSIS — E785 Hyperlipidemia, unspecified: Secondary | ICD-10-CM | POA: Diagnosis not present

## 2017-09-12 DIAGNOSIS — I10 Essential (primary) hypertension: Secondary | ICD-10-CM | POA: Diagnosis not present

## 2017-09-12 DIAGNOSIS — Z23 Encounter for immunization: Secondary | ICD-10-CM | POA: Diagnosis not present

## 2017-09-12 DIAGNOSIS — D510 Vitamin B12 deficiency anemia due to intrinsic factor deficiency: Secondary | ICD-10-CM | POA: Diagnosis not present

## 2017-09-12 DIAGNOSIS — R0609 Other forms of dyspnea: Secondary | ICD-10-CM | POA: Diagnosis not present

## 2017-11-01 DIAGNOSIS — Z01810 Encounter for preprocedural cardiovascular examination: Secondary | ICD-10-CM | POA: Diagnosis not present

## 2017-11-01 DIAGNOSIS — I483 Typical atrial flutter: Secondary | ICD-10-CM | POA: Diagnosis not present

## 2017-11-01 DIAGNOSIS — Z7901 Long term (current) use of anticoagulants: Secondary | ICD-10-CM | POA: Diagnosis not present

## 2017-11-01 DIAGNOSIS — I351 Nonrheumatic aortic (valve) insufficiency: Secondary | ICD-10-CM | POA: Diagnosis not present

## 2017-11-01 DIAGNOSIS — I38 Endocarditis, valve unspecified: Secondary | ICD-10-CM | POA: Diagnosis not present

## 2017-11-01 DIAGNOSIS — Z5181 Encounter for therapeutic drug level monitoring: Secondary | ICD-10-CM | POA: Diagnosis not present

## 2017-11-09 DIAGNOSIS — M150 Primary generalized (osteo)arthritis: Secondary | ICD-10-CM | POA: Diagnosis not present

## 2017-11-09 DIAGNOSIS — R0609 Other forms of dyspnea: Secondary | ICD-10-CM | POA: Diagnosis not present

## 2017-11-09 DIAGNOSIS — Z79899 Other long term (current) drug therapy: Secondary | ICD-10-CM | POA: Diagnosis not present

## 2017-11-09 DIAGNOSIS — M25551 Pain in right hip: Secondary | ICD-10-CM | POA: Diagnosis not present

## 2017-11-09 DIAGNOSIS — I4892 Unspecified atrial flutter: Secondary | ICD-10-CM | POA: Diagnosis not present

## 2017-11-09 DIAGNOSIS — R5383 Other fatigue: Secondary | ICD-10-CM | POA: Diagnosis not present

## 2017-11-09 DIAGNOSIS — F418 Other specified anxiety disorders: Secondary | ICD-10-CM | POA: Diagnosis not present

## 2017-12-04 DIAGNOSIS — M1611 Unilateral primary osteoarthritis, right hip: Secondary | ICD-10-CM | POA: Diagnosis not present

## 2017-12-04 DIAGNOSIS — M25551 Pain in right hip: Secondary | ICD-10-CM | POA: Diagnosis not present

## 2017-12-04 DIAGNOSIS — M1711 Unilateral primary osteoarthritis, right knee: Secondary | ICD-10-CM | POA: Diagnosis not present

## 2017-12-04 DIAGNOSIS — M5136 Other intervertebral disc degeneration, lumbar region: Secondary | ICD-10-CM | POA: Diagnosis not present

## 2017-12-06 DIAGNOSIS — Z880 Allergy status to penicillin: Secondary | ICD-10-CM | POA: Diagnosis not present

## 2017-12-06 DIAGNOSIS — M25551 Pain in right hip: Secondary | ICD-10-CM | POA: Diagnosis not present

## 2017-12-06 DIAGNOSIS — Z888 Allergy status to other drugs, medicaments and biological substances status: Secondary | ICD-10-CM | POA: Diagnosis not present

## 2017-12-06 DIAGNOSIS — Z882 Allergy status to sulfonamides status: Secondary | ICD-10-CM | POA: Diagnosis not present

## 2017-12-06 DIAGNOSIS — Z881 Allergy status to other antibiotic agents status: Secondary | ICD-10-CM | POA: Diagnosis not present

## 2017-12-06 DIAGNOSIS — Z79899 Other long term (current) drug therapy: Secondary | ICD-10-CM | POA: Diagnosis not present

## 2017-12-07 DIAGNOSIS — M1711 Unilateral primary osteoarthritis, right knee: Secondary | ICD-10-CM | POA: Diagnosis not present

## 2017-12-07 DIAGNOSIS — M25551 Pain in right hip: Secondary | ICD-10-CM | POA: Diagnosis not present

## 2017-12-07 DIAGNOSIS — M5136 Other intervertebral disc degeneration, lumbar region: Secondary | ICD-10-CM | POA: Diagnosis not present

## 2017-12-07 DIAGNOSIS — M1611 Unilateral primary osteoarthritis, right hip: Secondary | ICD-10-CM | POA: Diagnosis not present

## 2017-12-14 DIAGNOSIS — M9901 Segmental and somatic dysfunction of cervical region: Secondary | ICD-10-CM | POA: Diagnosis not present

## 2017-12-14 DIAGNOSIS — M5136 Other intervertebral disc degeneration, lumbar region: Secondary | ICD-10-CM | POA: Diagnosis not present

## 2017-12-14 DIAGNOSIS — M1711 Unilateral primary osteoarthritis, right knee: Secondary | ICD-10-CM | POA: Diagnosis not present

## 2017-12-14 DIAGNOSIS — M25551 Pain in right hip: Secondary | ICD-10-CM | POA: Diagnosis not present

## 2017-12-14 DIAGNOSIS — M542 Cervicalgia: Secondary | ICD-10-CM | POA: Diagnosis not present

## 2017-12-14 DIAGNOSIS — M1611 Unilateral primary osteoarthritis, right hip: Secondary | ICD-10-CM | POA: Diagnosis not present

## 2017-12-17 DIAGNOSIS — M9901 Segmental and somatic dysfunction of cervical region: Secondary | ICD-10-CM | POA: Diagnosis not present

## 2017-12-17 DIAGNOSIS — M542 Cervicalgia: Secondary | ICD-10-CM | POA: Diagnosis not present

## 2017-12-20 DIAGNOSIS — M1711 Unilateral primary osteoarthritis, right knee: Secondary | ICD-10-CM | POA: Diagnosis not present

## 2017-12-20 DIAGNOSIS — M5136 Other intervertebral disc degeneration, lumbar region: Secondary | ICD-10-CM | POA: Diagnosis not present

## 2017-12-20 DIAGNOSIS — M1611 Unilateral primary osteoarthritis, right hip: Secondary | ICD-10-CM | POA: Diagnosis not present

## 2017-12-27 DIAGNOSIS — M1611 Unilateral primary osteoarthritis, right hip: Secondary | ICD-10-CM | POA: Diagnosis not present

## 2017-12-27 DIAGNOSIS — M5136 Other intervertebral disc degeneration, lumbar region: Secondary | ICD-10-CM | POA: Diagnosis not present

## 2017-12-27 DIAGNOSIS — M1711 Unilateral primary osteoarthritis, right knee: Secondary | ICD-10-CM | POA: Diagnosis not present

## 2017-12-27 DIAGNOSIS — M50322 Other cervical disc degeneration at C5-C6 level: Secondary | ICD-10-CM | POA: Diagnosis not present

## 2018-01-02 DIAGNOSIS — R0982 Postnasal drip: Secondary | ICD-10-CM | POA: Diagnosis not present

## 2018-01-02 DIAGNOSIS — H9319 Tinnitus, unspecified ear: Secondary | ICD-10-CM | POA: Diagnosis not present

## 2018-01-02 DIAGNOSIS — J309 Allergic rhinitis, unspecified: Secondary | ICD-10-CM | POA: Diagnosis not present

## 2018-01-02 DIAGNOSIS — H6123 Impacted cerumen, bilateral: Secondary | ICD-10-CM | POA: Diagnosis not present

## 2018-01-02 DIAGNOSIS — J329 Chronic sinusitis, unspecified: Secondary | ICD-10-CM | POA: Diagnosis not present

## 2018-01-04 DIAGNOSIS — M50322 Other cervical disc degeneration at C5-C6 level: Secondary | ICD-10-CM | POA: Diagnosis not present

## 2018-01-04 DIAGNOSIS — E785 Hyperlipidemia, unspecified: Secondary | ICD-10-CM | POA: Diagnosis not present

## 2018-01-04 DIAGNOSIS — I10 Essential (primary) hypertension: Secondary | ICD-10-CM | POA: Diagnosis not present

## 2018-01-04 DIAGNOSIS — B029 Zoster without complications: Secondary | ICD-10-CM | POA: Diagnosis not present

## 2018-01-04 DIAGNOSIS — M5136 Other intervertebral disc degeneration, lumbar region: Secondary | ICD-10-CM | POA: Diagnosis not present

## 2018-01-04 DIAGNOSIS — M1611 Unilateral primary osteoarthritis, right hip: Secondary | ICD-10-CM | POA: Diagnosis not present

## 2018-01-04 DIAGNOSIS — M792 Neuralgia and neuritis, unspecified: Secondary | ICD-10-CM | POA: Diagnosis not present

## 2018-01-04 DIAGNOSIS — M1711 Unilateral primary osteoarthritis, right knee: Secondary | ICD-10-CM | POA: Diagnosis not present

## 2018-01-04 DIAGNOSIS — I4892 Unspecified atrial flutter: Secondary | ICD-10-CM | POA: Diagnosis not present

## 2018-01-24 DIAGNOSIS — M1611 Unilateral primary osteoarthritis, right hip: Secondary | ICD-10-CM | POA: Diagnosis not present

## 2018-01-24 DIAGNOSIS — M5136 Other intervertebral disc degeneration, lumbar region: Secondary | ICD-10-CM | POA: Diagnosis not present

## 2018-01-24 DIAGNOSIS — M50322 Other cervical disc degeneration at C5-C6 level: Secondary | ICD-10-CM | POA: Diagnosis not present

## 2018-01-24 DIAGNOSIS — M1711 Unilateral primary osteoarthritis, right knee: Secondary | ICD-10-CM | POA: Diagnosis not present

## 2018-02-20 DIAGNOSIS — N182 Chronic kidney disease, stage 2 (mild): Secondary | ICD-10-CM | POA: Diagnosis not present

## 2018-02-20 DIAGNOSIS — J45909 Unspecified asthma, uncomplicated: Secondary | ICD-10-CM | POA: Diagnosis not present

## 2018-02-20 DIAGNOSIS — D510 Vitamin B12 deficiency anemia due to intrinsic factor deficiency: Secondary | ICD-10-CM | POA: Diagnosis not present

## 2018-02-20 DIAGNOSIS — N19 Unspecified kidney failure: Secondary | ICD-10-CM | POA: Diagnosis not present

## 2018-02-20 DIAGNOSIS — M150 Primary generalized (osteo)arthritis: Secondary | ICD-10-CM | POA: Diagnosis not present

## 2018-02-20 DIAGNOSIS — E785 Hyperlipidemia, unspecified: Secondary | ICD-10-CM | POA: Diagnosis not present

## 2018-02-20 DIAGNOSIS — Z79899 Other long term (current) drug therapy: Secondary | ICD-10-CM | POA: Diagnosis not present

## 2018-02-20 DIAGNOSIS — I10 Essential (primary) hypertension: Secondary | ICD-10-CM | POA: Diagnosis not present

## 2018-03-27 DIAGNOSIS — I480 Paroxysmal atrial fibrillation: Secondary | ICD-10-CM | POA: Diagnosis not present

## 2018-03-27 DIAGNOSIS — A0471 Enterocolitis due to Clostridium difficile, recurrent: Secondary | ICD-10-CM | POA: Diagnosis not present

## 2018-03-27 DIAGNOSIS — I10 Essential (primary) hypertension: Secondary | ICD-10-CM | POA: Diagnosis not present

## 2018-03-27 DIAGNOSIS — R197 Diarrhea, unspecified: Secondary | ICD-10-CM | POA: Diagnosis not present

## 2018-04-01 DIAGNOSIS — N390 Urinary tract infection, site not specified: Secondary | ICD-10-CM | POA: Diagnosis not present

## 2018-04-01 DIAGNOSIS — D510 Vitamin B12 deficiency anemia due to intrinsic factor deficiency: Secondary | ICD-10-CM | POA: Diagnosis not present

## 2018-04-01 DIAGNOSIS — I4892 Unspecified atrial flutter: Secondary | ICD-10-CM | POA: Diagnosis not present

## 2018-04-01 DIAGNOSIS — M150 Primary generalized (osteo)arthritis: Secondary | ICD-10-CM | POA: Diagnosis not present

## 2018-04-01 DIAGNOSIS — J449 Chronic obstructive pulmonary disease, unspecified: Secondary | ICD-10-CM | POA: Diagnosis not present

## 2018-04-01 DIAGNOSIS — R300 Dysuria: Secondary | ICD-10-CM | POA: Diagnosis not present

## 2018-04-01 DIAGNOSIS — I10 Essential (primary) hypertension: Secondary | ICD-10-CM | POA: Diagnosis not present

## 2018-04-01 DIAGNOSIS — E785 Hyperlipidemia, unspecified: Secondary | ICD-10-CM | POA: Diagnosis not present

## 2018-04-01 DIAGNOSIS — R0609 Other forms of dyspnea: Secondary | ICD-10-CM | POA: Diagnosis not present

## 2018-05-26 DIAGNOSIS — J019 Acute sinusitis, unspecified: Secondary | ICD-10-CM | POA: Diagnosis not present

## 2018-05-29 DIAGNOSIS — J329 Chronic sinusitis, unspecified: Secondary | ICD-10-CM | POA: Diagnosis not present

## 2018-06-06 ENCOUNTER — Emergency Department (HOSPITAL_COMMUNITY): Payer: Medicare Other

## 2018-06-06 ENCOUNTER — Observation Stay (HOSPITAL_COMMUNITY)
Admission: EM | Admit: 2018-06-06 | Discharge: 2018-06-07 | Disposition: A | Discharge status: Home / Self Care | Payer: Medicare Other | Attending: Internal Medicine | Admitting: Internal Medicine

## 2018-06-06 ENCOUNTER — Other Ambulatory Visit: Payer: Self-pay

## 2018-06-06 ENCOUNTER — Encounter (HOSPITAL_COMMUNITY): Payer: Self-pay

## 2018-06-06 DIAGNOSIS — Y9389 Activity, other specified: Secondary | ICD-10-CM | POA: Insufficient documentation

## 2018-06-06 DIAGNOSIS — S199XXA Unspecified injury of neck, initial encounter: Secondary | ICD-10-CM | POA: Diagnosis not present

## 2018-06-06 DIAGNOSIS — R7989 Other specified abnormal findings of blood chemistry: Secondary | ICD-10-CM | POA: Diagnosis not present

## 2018-06-06 DIAGNOSIS — N390 Urinary tract infection, site not specified: Secondary | ICD-10-CM | POA: Insufficient documentation

## 2018-06-06 DIAGNOSIS — W1839XA Other fall on same level, initial encounter: Secondary | ICD-10-CM | POA: Diagnosis not present

## 2018-06-06 DIAGNOSIS — Y999 Unspecified external cause status: Secondary | ICD-10-CM | POA: Insufficient documentation

## 2018-06-06 DIAGNOSIS — Z79899 Other long term (current) drug therapy: Secondary | ICD-10-CM | POA: Insufficient documentation

## 2018-06-06 DIAGNOSIS — Z7901 Long term (current) use of anticoagulants: Secondary | ICD-10-CM | POA: Diagnosis not present

## 2018-06-06 DIAGNOSIS — R531 Weakness: Secondary | ICD-10-CM | POA: Diagnosis not present

## 2018-06-06 DIAGNOSIS — F419 Anxiety disorder, unspecified: Secondary | ICD-10-CM | POA: Diagnosis not present

## 2018-06-06 DIAGNOSIS — G8929 Other chronic pain: Secondary | ICD-10-CM | POA: Diagnosis not present

## 2018-06-06 DIAGNOSIS — D500 Iron deficiency anemia secondary to blood loss (chronic): Secondary | ICD-10-CM | POA: Diagnosis not present

## 2018-06-06 DIAGNOSIS — E860 Dehydration: Secondary | ICD-10-CM

## 2018-06-06 DIAGNOSIS — D649 Anemia, unspecified: Secondary | ICD-10-CM

## 2018-06-06 DIAGNOSIS — I959 Hypotension, unspecified: Secondary | ICD-10-CM

## 2018-06-06 DIAGNOSIS — R778 Other specified abnormalities of plasma proteins: Secondary | ICD-10-CM

## 2018-06-06 DIAGNOSIS — K219 Gastro-esophageal reflux disease without esophagitis: Secondary | ICD-10-CM | POA: Insufficient documentation

## 2018-06-06 DIAGNOSIS — M5030 Other cervical disc degeneration, unspecified cervical region: Secondary | ICD-10-CM

## 2018-06-06 DIAGNOSIS — N189 Chronic kidney disease, unspecified: Secondary | ICD-10-CM | POA: Diagnosis not present

## 2018-06-06 DIAGNOSIS — Y92002 Bathroom of unspecified non-institutional (private) residence single-family (private) house as the place of occurrence of the external cause: Secondary | ICD-10-CM | POA: Insufficient documentation

## 2018-06-06 DIAGNOSIS — S61411A Laceration without foreign body of right hand, initial encounter: Principal | ICD-10-CM | POA: Insufficient documentation

## 2018-06-06 DIAGNOSIS — M542 Cervicalgia: Secondary | ICD-10-CM | POA: Insufficient documentation

## 2018-06-06 DIAGNOSIS — N179 Acute kidney failure, unspecified: Secondary | ICD-10-CM

## 2018-06-06 DIAGNOSIS — R42 Dizziness and giddiness: Secondary | ICD-10-CM | POA: Diagnosis not present

## 2018-06-06 DIAGNOSIS — S0990XA Unspecified injury of head, initial encounter: Secondary | ICD-10-CM | POA: Diagnosis not present

## 2018-06-06 DIAGNOSIS — R0902 Hypoxemia: Secondary | ICD-10-CM | POA: Diagnosis not present

## 2018-06-06 DIAGNOSIS — I4892 Unspecified atrial flutter: Secondary | ICD-10-CM | POA: Diagnosis not present

## 2018-06-06 DIAGNOSIS — R5381 Other malaise: Secondary | ICD-10-CM | POA: Insufficient documentation

## 2018-06-06 DIAGNOSIS — S299XXA Unspecified injury of thorax, initial encounter: Secondary | ICD-10-CM | POA: Diagnosis not present

## 2018-06-06 DIAGNOSIS — R55 Syncope and collapse: Secondary | ICD-10-CM

## 2018-06-06 DIAGNOSIS — W19XXXA Unspecified fall, initial encounter: Secondary | ICD-10-CM | POA: Diagnosis present

## 2018-06-06 HISTORY — DX: Chronic obstructive pulmonary disease, unspecified: J44.9

## 2018-06-06 HISTORY — DX: Disorder of kidney and ureter, unspecified: N28.9

## 2018-06-06 HISTORY — DX: Gastro-esophageal reflux disease without esophagitis: K21.9

## 2018-06-06 HISTORY — DX: Unspecified atrial flutter: I48.92

## 2018-06-06 HISTORY — DX: Irritable bowel syndrome, unspecified: K58.9

## 2018-06-06 HISTORY — DX: Urinary tract infection, site not specified: N39.0

## 2018-06-06 LAB — URINALYSIS, ROUTINE W REFLEX MICROSCOPIC
Bilirubin Urine: NEGATIVE
GLUCOSE, UA: NEGATIVE mg/dL
Ketones, ur: NEGATIVE mg/dL
Nitrite: NEGATIVE
PH: 6 (ref 5.0–8.0)
Protein, ur: 30 mg/dL — AB
SPECIFIC GRAVITY, URINE: 1.015 (ref 1.005–1.030)
WBC, UA: >50 WBC/hpf — ABNORMAL HIGH (ref 0–5)

## 2018-06-06 LAB — COMPREHENSIVE METABOLIC PANEL
ALK PHOS: 58 U/L (ref 38–126)
ALT: 16 U/L (ref 0–44)
AST: 23 U/L (ref 15–41)
Albumin: 3.1 g/dL — ABNORMAL LOW (ref 3.5–5.0)
Anion gap: 7 (ref 5–15)
BUN: 19 mg/dL (ref 8–23)
CO2: 21 mmol/L — ABNORMAL LOW (ref 22–32)
CREATININE: 1.24 mg/dL — AB (ref 0.44–1.00)
Calcium: 8.4 mg/dL — ABNORMAL LOW (ref 8.9–10.3)
Chloride: 110 mmol/L (ref 98–111)
GFR calc Af Amer: 45 mL/min — ABNORMAL LOW (ref >60)
GFR, EST NON AFRICAN AMERICAN: 39 mL/min — AB (ref >60)
Glucose, Bld: 117 mg/dL — ABNORMAL HIGH (ref 70–99)
Potassium: 3.8 mmol/L (ref 3.5–5.1)
Sodium: 138 mmol/L (ref 135–145)
Total Bilirubin: 0.4 mg/dL (ref 0.3–1.2)
Total Protein: 6 g/dL — ABNORMAL LOW (ref 6.5–8.1)

## 2018-06-06 LAB — CBC WITH DIFFERENTIAL/PLATELET
Basophils Absolute: 0 10*3/uL (ref 0.0–0.1)
Basophils Relative: 0 %
Eosinophils Absolute: 0.1 10*3/uL (ref 0.0–0.7)
Eosinophils Relative: 1 %
HCT: 32.1 % — ABNORMAL LOW (ref 36.0–46.0)
Hemoglobin: 10.4 g/dL — ABNORMAL LOW (ref 12.0–15.0)
Lymphocytes Relative: 20 %
Lymphs Abs: 2.5 10*3/uL (ref 0.7–4.0)
MCH: 28.1 pg (ref 26.0–34.0)
MCHC: 32.4 g/dL (ref 30.0–36.0)
MCV: 86.8 fL (ref 78.0–100.0)
Monocytes Absolute: 1.4 10*3/uL — ABNORMAL HIGH (ref 0.1–1.0)
Monocytes Relative: 11 %
Neutro Abs: 8.7 10*3/uL — ABNORMAL HIGH (ref 1.7–7.7)
Neutrophils Relative %: 68 %
Platelets: 306 10*3/uL (ref 150–400)
RBC: 3.7 MIL/uL — ABNORMAL LOW (ref 3.87–5.11)
RDW: 14.5 % (ref 11.5–15.5)
WBC: 12.7 10*3/uL — ABNORMAL HIGH (ref 4.0–10.5)

## 2018-06-06 LAB — TROPONIN I
Troponin I: 0.05 ng/mL (ref <0.03)
Troponin I: <0.03 ng/mL (ref <0.03)

## 2018-06-06 MED ORDER — ESCITALOPRAM OXALATE 10 MG PO TABS
20.0000 mg | ORAL_TABLET | Freq: Every day | ORAL | Status: DC
  Administered 2018-06-07: 20 mg via ORAL
  Filled 2018-06-06: qty 2

## 2018-06-06 MED ORDER — ACETAMINOPHEN 325 MG PO TABS
650.0000 mg | ORAL_TABLET | Freq: Four times a day (QID) | ORAL | Status: DC | PRN
  Administered 2018-06-06 – 2018-06-07 (×2): 650 mg via ORAL
  Filled 2018-06-06 (×2): qty 2

## 2018-06-06 MED ORDER — SODIUM CHLORIDE 0.9 % IV SOLN
INTRAVENOUS | Status: DC
  Administered 2018-06-06 – 2018-06-07 (×2): via INTRAVENOUS

## 2018-06-06 MED ORDER — RIVAROXABAN 15 MG PO TABS
15.0000 mg | ORAL_TABLET | Freq: Every day | ORAL | Status: DC
  Administered 2018-06-07: 15 mg via ORAL
  Filled 2018-06-06: qty 1

## 2018-06-06 MED ORDER — DILTIAZEM HCL 30 MG PO TABS
30.0000 mg | ORAL_TABLET | Freq: Every day | ORAL | Status: DC
  Administered 2018-06-06 – 2018-06-07 (×2): 30 mg via ORAL
  Filled 2018-06-06 (×2): qty 1

## 2018-06-06 MED ORDER — SODIUM CHLORIDE 0.9 % IV SOLN
1.0000 g | Freq: Once | INTRAVENOUS | Status: AC
  Administered 2018-06-06: 1 g via INTRAVENOUS
  Filled 2018-06-06: qty 10

## 2018-06-06 MED ORDER — SODIUM CHLORIDE 0.9 % IV BOLUS (SEPSIS)
500.0000 mL | Freq: Once | INTRAVENOUS | Status: AC
  Administered 2018-06-06: 500 mL via INTRAVENOUS

## 2018-06-06 MED ORDER — SODIUM CHLORIDE 0.9 % IV SOLN
1000.0000 mL | INTRAVENOUS | Status: DC
  Administered 2018-06-06: 1000 mL via INTRAVENOUS

## 2018-06-06 MED ORDER — PANTOPRAZOLE SODIUM 40 MG PO TBEC
40.0000 mg | DELAYED_RELEASE_TABLET | Freq: Every day | ORAL | Status: DC
  Administered 2018-06-06 – 2018-06-07 (×2): 40 mg via ORAL
  Filled 2018-06-06 (×2): qty 1

## 2018-06-06 MED ORDER — SODIUM CHLORIDE 0.9 % IV SOLN
1.0000 g | INTRAVENOUS | Status: DC
  Administered 2018-06-07: 1 g via INTRAVENOUS
  Filled 2018-06-06: qty 10
  Filled 2018-06-06: qty 1
  Filled 2018-06-06: qty 10

## 2018-06-06 MED ORDER — TRAZODONE HCL 50 MG PO TABS
50.0000 mg | ORAL_TABLET | Freq: Every day | ORAL | Status: DC
  Filled 2018-06-06: qty 1

## 2018-06-06 MED ORDER — ACETAMINOPHEN 650 MG RE SUPP: 650.0000 mg | Freq: Four times a day (QID) | RECTAL | Status: DC | PRN

## 2018-06-06 NOTE — ED Notes (Signed)
Date and time results received: 10/03/191635 (use smartphrase ".now" to insert current time)  Test: troponin Critical Value: 0.05  Name of Provider Notified: J KNAPP  Orders Received? Or Actions Taken?: NA

## 2018-06-06 NOTE — ED Provider Notes (Addendum)
[facility] EMERGENCY DEPARTMENT Provider Note   CSN: [id number] Arrival date & time: 06/06/18  1425     History   Chief Complaint Chief Complaint  Patient presents with  . Fall    HPI Diana Mata is a 84 y.o. female.  HPI Patient presented to the emergency room for evaluation of a fall this morning associated with weakness.  Earlier this week the patient felt like she was developing a UTI.  She had her urine tested and was diagnosed with UTI and started on Macrobid.  Patient has not been particularly eating or drinking well.  This morning the daughter found the patient lying on the floor.  Patient states she was walking back from the bathroom to her bed when she fell.  She does not think she lost consciousness but she is not sure.  Daughter helped her up from the floor into a chair.  They went to try to go to the chiropractor because she has some chronic neck pain in her neck was bothering her.  Patient is not sure that she injured that anymore in the fall.  When they were trying to get up and get dressed the patient was having a lot of difficulty standing and she was too weak.  EMS was called and they noted a low blood pressure of 77/50 by report.  She denies any trouble with chest pain or shortness of breath.  She did have one episode of vomiting recently but has not had frequent episodes.  She denies any abdominal pain.  Has not had any frequent diarrhea.  Known if she is had any fevers. Past Medical History:  Diagnosis Date  . Atrial flutter (HCC)   . Chronic UTI   . COPD (chronic obstructive pulmonary disease) (HCC)   . GERD (gastroesophageal reflux disease)   . IBS (irritable bowel syndrome)   . Renal disorder    renal insufficiency    Patient Active Problem List   Diagnosis Date Noted  . Syncope 06/06/2018    Past Surgical History:  Procedure Laterality Date  . ABDOMINAL HYSTERECTOMY    . 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 JOINT REPLACEMENT       OB History     None      Home Medications    Prior to Admission medications   Medication Sig Start Date End Date Taking? Authorizing Provider  dexlansoprazole (DEXILANT) 60 MG capsule Take 60 mg by mouth daily.   Yes [provider]  diazepam (VALIUM) 5 MG tablet Take 5-10 mg by mouth every 6 (six) hours as needed for anxiety.   Yes [provider]  diltiazem (CARDIZEM) 30 MG tablet Take 30 mg by mouth daily.   Yes [provider]  escitalopram (LEXAPRO) 10 MG tablet Take 20 mg by mouth daily.    Yes [provider]  guaiFENesin (MUCINEX) 600 MG 12 hr tablet Take by mouth 2 (two) times daily.   Yes [provider]  HYDROcodone-acetaminophen (NORCO/VICODIN) 5-325 MG tablet Take 1 tablet by mouth 2 (two) times daily.   Yes [provider]  nitrofurantoin, macrocrystal-monohydrate, (MACROBID) 100 MG capsule Take 100 mg by mouth 2 (two) times daily.   Yes [provider]  Rivaroxaban (XARELTO) 15 MG TABS tablet Take 15 mg by mouth daily with supper.    Yes [provider]  traZODone (DESYREL) 50 MG tablet Take 50 mg by mouth at bedtime.   Yes [provider]    Family History No  family history on file.  Social History Social History   Tobacco Use  . Smoking status: Never Smoker  . Smokeless tobacco: Never Used  Substance Use Topics  . Alcohol use: Not on file    Comment: occ  . Drug use: Never     Allergies   Levaquin [levofloxacin in d5w]; Penicillins; Sulfa antibiotics; and Tequin [gatifloxacin]   Review of Systems Review of Systems  All other systems reviewed and are negative.    Physical Exam Updated Vital Signs BP 111/62   Pulse 74   Temp 98.8 F (37.1 C) (Oral)   Resp 18   Ht 1.626 m (5\' 4" )   Wt 59 kg   SpO2 98%   BMI 22.31 kg/m   Physical Exam  Constitutional:  Elderly, frail  HENT:  Head: Normocephalic and atraumatic.  Right Ear: External ear normal.  Left Ear: External ear  normal.  Mm dry  Eyes: Conjunctivae are normal. Right eye exhibits no discharge. Left eye exhibits no discharge. No scleral icterus.  Neck: Neck supple. No tracheal deviation present.  Cardiovascular: Normal rate, regular rhythm and intact distal pulses.  Pulmonary/Chest: Effort normal and breath sounds normal. No stridor. No respiratory distress. She has no wheezes. She has no rales.  Abdominal: Soft. Bowel sounds are normal. She exhibits no distension. There is no tenderness. There is no rebound and no guarding.  Musculoskeletal: She exhibits no edema.       Right shoulder: She exhibits no tenderness, no bony tenderness and no swelling.       Left shoulder: She exhibits no tenderness, no bony tenderness and no swelling.       Right wrist: She exhibits no tenderness, no bony tenderness and no swelling.       Left wrist: She exhibits no tenderness, no bony tenderness and no swelling.       Right hip: She exhibits normal range of motion, no tenderness, no bony tenderness and no swelling.       Left hip: She exhibits normal range of motion, no tenderness and no bony tenderness.       Right ankle: She exhibits no swelling. No tenderness.       Left ankle: She exhibits no swelling. No tenderness.       Cervical back: She exhibits tenderness. She exhibits no bony tenderness and no swelling.       Thoracic back: She exhibits no tenderness, no bony tenderness and no swelling.       Lumbar back: She exhibits no tenderness, no bony tenderness and no swelling.  Small bruise right forearm, full range of motion hips without discomfort  Neurological: She is alert. She has normal strength. No cranial nerve deficit (no facial droop, extraocular movements intact, no slurred speech) or sensory deficit. She exhibits normal muscle tone. She displays no seizure activity. Coordination normal.  Skin: Skin is warm and dry. No rash noted.  4 cm Superficial Skin tear on the dorsal aspect of the right hand,   Psychiatric: She has a normal mood and affect.  Nursing note and vitals reviewed.    ED Treatments / Results  Labs (all labs ordered are listed, but only abnormal results are displayed) Labs Reviewed  CBC WITH DIFFERENTIAL/PLATELET - Abnormal; Notable for the following components:      Result Value   WBC 12.7 (*)    RBC 3.70 (*)    Hemoglobin 10.4 (*)    HCT 32.1 (*)    Neutro Abs 8.7 (*)  Monocytes Absolute 1.4 (*)    All other components within normal limits  COMPREHENSIVE METABOLIC PANEL - Abnormal; Notable for the following components:   CO2 21 (*)    Glucose, Bld 117 (*)    Creatinine, Ser 1.24 (*)    Calcium 8.4 (*)    Total Protein 6.0 (*)    Albumin 3.1 (*)    GFR calc non Af Amer 39 (*)    GFR calc Af Amer 45 (*)    All other components within normal limits  URINALYSIS, ROUTINE W REFLEX MICROSCOPIC - Abnormal; Notable for the following components:   Color, Urine AMBER (*)    APPearance CLOUDY (*)    Hgb urine dipstick MODERATE (*)    Protein, ur 30 (*)    Leukocytes, UA LARGE (*)    WBC, UA >50 (*)    Bacteria, UA RARE (*)    All other components within normal limits  TROPONIN I - Abnormal; Notable for the following components:   Troponin I 0.05 (*)    All other components within normal limits  URINE CULTURE    EKG EKG Interpretation  Date/Time:  Thursday June 06 2018 16:15:49 EDT Ventricular Rate:  76 PR Interval:    QRS Duration: 138 QT Interval:  445 QTC Calculation: 501 R Axis:   17 Text Interpretation:  Sinus rhythm Atrial premature complex Left bundle branch block No old tracing to compare Confirmed by Linwood Dibbles [id number]) on 06/06/2018 4:18:42 PM   Radiology Dg Chest 2 View  Result Date: 06/06/2018 CLINICAL DATA:  Syncope. Fall. History of COPD. Initial encounter. EXAM: CHEST - 2 VIEW COMPARISON:  None. FINDINGS: The cardiac silhouette is borderline enlarged. The lungs are slightly hyperinflated with peribronchial thickening and  interstitial coarsening. Mild scarring is noted in the right mid lung and left lung base. No sizable pleural effusion or pneumothorax is identified. No acute osseous abnormality is identified. IMPRESSION: Chronic bronchitic changes/COPD without evidence of acute abnormality. Electronically Signed   By: Sebastian Ache M.D.   On: 06/06/2018 16:05   Ct Head Wo Contrast  Result Date: 06/06/2018 CLINICAL DATA:  Dizziness, fall. EXAM: CT HEAD WITHOUT CONTRAST CT CERVICAL SPINE WITHOUT CONTRAST TECHNIQUE: Multidetector CT imaging of the head and cervical spine was performed following the standard protocol without intravenous contrast. Multiplanar CT image reconstructions of the cervical spine were also generated. COMPARISON:  None. FINDINGS: CT HEAD FINDINGS Brain: Mild diffuse cortical atrophy is noted. Mild chronic ischemic white matter disease is noted. No mass effect or midline shift is noted. Ventricular size is within normal limits. There is no evidence of mass lesion, hemorrhage or acute infarction. Vascular: No hyperdense vessel or unexpected calcification. Skull: Status post left temporal craniotomy. No acute abnormality is noted. Sinuses/Orbits: No acute finding. Other: None. CT CERVICAL SPINE FINDINGS Alignment: Normal. Skull base and vertebrae: No acute fracture. No primary bone lesion or focal pathologic process. Soft tissues and spinal canal: No prevertebral fluid or swelling. No visible canal hematoma. Disc levels: Moderate degenerative disc disease is noted at C4-5 and C5-6. Upper chest: Negative. Other: Degenerative changes are seen involving the left-sided posterior facet joints. IMPRESSION: Mild diffuse cortical atrophy. Mild chronic ischemic white matter disease. No acute intracranial abnormality seen. Moderate multilevel degenerative disc disease. No acute abnormality seen in the cervical spine Electronically Signed   By: Lupita Raider, M.D.   On: 06/06/2018 16:53   Ct Cervical Spine Wo  Contrast  Result Date: 06/06/2018 CLINICAL DATA:  Dizziness, fall. EXAM:  CT HEAD WITHOUT CONTRAST CT CERVICAL SPINE WITHOUT CONTRAST TECHNIQUE: Multidetector CT imaging of the head and cervical spine was performed following the standard protocol without intravenous contrast. Multiplanar CT image reconstructions of the cervical spine were also generated. COMPARISON:  None. FINDINGS: CT HEAD FINDINGS Brain: Mild diffuse cortical atrophy is noted. Mild chronic ischemic white matter disease is noted. No mass effect or midline shift is noted. Ventricular size is within normal limits. There is no evidence of mass lesion, hemorrhage or acute infarction. Vascular: No hyperdense vessel or unexpected calcification. Skull: Status post left temporal craniotomy. No acute abnormality is noted. Sinuses/Orbits: No acute finding. Other: None. CT CERVICAL SPINE FINDINGS Alignment: Normal. Skull base and vertebrae: No acute fracture. No primary bone lesion or focal pathologic process. Soft tissues and spinal canal: No prevertebral fluid or swelling. No visible canal hematoma. Disc levels: Moderate degenerative disc disease is noted at C4-5 and C5-6. Upper chest: Negative. Other: Degenerative changes are seen involving the left-sided posterior facet joints. IMPRESSION: Mild diffuse cortical atrophy. Mild chronic ischemic white matter disease. No acute intracranial abnormality seen. Moderate multilevel degenerative disc disease. No acute abnormality seen in the cervical spine Electronically Signed   By: Lupita Raider, M.D.   On: 06/06/2018 16:53    Procedures Procedures (including critical care time)  Medications Ordered in ED Medications  0.9 %  sodium chloride infusion ( Intravenous Not Given 06/06/18 1730)  sodium chloride 0.9 % bolus 500 mL (0 mLs Intravenous Stopped 06/06/18 1544)    Followed by  0.9 %  sodium chloride infusion (1,000 mLs Intravenous New Bag/Given 06/06/18 1614)  cefTRIAXone (ROCEPHIN) 1 g in sodium  chloride 0.9 % 100 mL IVPB (1 g Intravenous New Bag/Given 06/06/18 1751)     Initial Impression / Assessment and Plan / ED Course  I have reviewed the triage vital signs and the nursing notes.  Pertinent labs & imaging results that were available during my care of the patient were reviewed by me and considered in my medical decision making (see chart for details).  Clinical Course as of Jun 06 1818  Thu Jun 06, 2018  1716 Labs reviewed.  Troponin is slightly elevated.  Electrolyte panel shows a slightly elevated creatinine although there are no comparison values to see if this is acute or chronic.  Protein level is low.  Patient does have a leukocytosis and an anemia.  Patient's daughter states that her anemia is chronic.  Urinalysis is pending x-rays did not show any acute abnormalities   [JK]  1736 Discussed with Dr. Loney Loh.  Requests cardiology consult to see if we should transfer considering her BBB and trop elevation.  I will consult cardiology.   [JK]  1818 I spoke with Dr. Delton See cardiology.  She reviewed the laboratory findings and EKG.  Patient can remain here at any [facility].  She recommends cardiology consultation so they can see her tomorrow.  If the patient develops any chest pain or the troponin starts to elevate significantly then she would recommend transfer to [facility]   [JK]    Clinical Course User Index [JK] Linwood Dibbles, MD   Patient presented to the emergency room after a possible syncopal episode and documented hypotension.  Patient's blood pressure improved here in the emergency room with IV fluids.  She may be dehydrated associated with her known urinary tract infection.  No fever, appears well, I doubt sepsis.  Patient also does have a slightly elevated troponin but no definite acute ischemia  noted on her EKG and she is not having any chest pain or shortness of breath.  Considering these abnormalities I think is reasonable to bring her in for overnight  observation.  We will continue with IV hydration.  Final Clinical Impressions(s) / ED Diagnoses   Final diagnoses:  Syncope, unspecified syncope type  Dehydration  Anemia, unspecified type  Elevated troponin        Linwood Dibbles, MD 06/06/18 1819

## 2018-06-06 NOTE — ED Notes (Signed)
Bed posted. Attempted to give report. Informed receiving nurse to call back for report.

## 2018-06-06 NOTE — ED Notes (Signed)
Skin tear to right forearm irrigated. Steri strips applied with nonadherent dressing. Pt tolerated fair.

## 2018-06-06 NOTE — ED Notes (Signed)
Pt seen by hospitalist. Awaiting transfer to floor.

## 2018-06-06 NOTE — H&P (Signed)
History and Physical    Diana Mata ZOX:[id number] DOB: [birth year] DOA: 06/06/2018  PCP: System, Pcp Not In   Patient coming from: Home  Chief Complaint: Fall  HPI: Diana Mata is a 84 y.o. female with medical history significant of atrial flutter, chronic UTIs, IBS presenting to the hospital for further evaluation of a fall at home.  Patient reports having generalized weakness.  States she felt lightheaded today and fell on the floor.  Also reports having chronic neck pain.  She does not think she sustained any injuries from this fall; denies hitting her head on the floor.  States she chronically feels dizzy and takes meclizine at home.  Patient denies having any chest pain or shortness of breath at the time of the fall.  Reports having dysuria, urinary frequency, and urgency.  Also reports having a poor appetite. States she has chronic diarrhea due to IBS.  Daughter at bedside states she  found the patient on the floor and she was conscious at that time.  She did not notice any tongue biting or bowel/ bladder incontinence.  States since after the fall this morning patient has been complaining of lightheadedness and has appeared to be unsteady when walking which prompted the family to call EMS.  Per daughter, patient was diagnosed with a UTI earlier this week and was prescribed Macrobid by her primary care doctor.  Patient is visiting from [place] and has a cardiologist over there.  Per ED documentation, EMS noted a low blood pressure of 77/50 on arrival.  ED Course: Vitals on arrival to the ED-afebrile, pulse 76, blood pressure 99/49, and SPO2 95% on room air.  Labs showing leukocytosis (white blood cell count 12.7).  Creatinine 1.2.  Troponin 0.05.  Urinalysis showing moderate amount of hemoglobin, proteinuria, large amount of leukocytes, and negative nitrite.  Chest x-ray showing chronic bronchitic changes/COPD without evidence of acute abnormality.  CT head showing mild chronic  ischemic white matter disease and no acute intracranial abnormality.  CT C-spine showing moderate multilevel degenerative disc disease and no acute abnormality in the C-spine.  EKG showing sinus rhythm with left bundle branch block (no prior EKG in chart for comparison).  Patient received a 500 cc bolus of normal saline and a dose of IV ceftriaxone in the ED.  Review of Systems: As per HPI otherwise 10 point review of systems negative.    Past Medical History:  Diagnosis Date  . Atrial flutter (HCC)   . Chronic UTI   . COPD (chronic obstructive pulmonary disease) (HCC)   . GERD (gastroesophageal reflux disease)   . IBS (irritable bowel syndrome)   . Renal disorder    renal insufficiency    Past Surgical History:  Procedure Laterality Date  . ABDOMINAL HYSTERECTOMY    . 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 JOINT REPLACEMENT     Social history Denies tobacco or drug use.  Drinks alcohol occasionally.  Allergies  Allergen Reactions  . Levaquin [Levofloxacin In D5w]   . Penicillins     Has patient had a PCN reaction causing immediate rash, facial/tongue/throat swelling, SOB or lightheadedness with hypotension: No Has patient had a PCN reaction causing severe rash involving mucus membranes or skin necrosis: No Has patient had a PCN reaction that required hospitalization: No Has patient had a PCN reaction occurring within the last 10 years: No If all of the above answers are "NO", then may proceed with Cephalosporin use.   . Sulfa Antibiotics   . Tequin [Gatifloxacin]  Family history Mother had cancer.  Prior to Admission medications   Medication Sig Start Date End Date Taking? Authorizing Provider  dexlansoprazole (DEXILANT) 60 MG capsule Take 60 mg by mouth daily.   Yes [provider]  diazepam (VALIUM) 5 MG tablet Take 5-10 mg by mouth every 6 (six) hours as needed for anxiety.   Yes [provider]  diltiazem (CARDIZEM) 30 MG tablet Take 30 mg by mouth daily.    Yes [provider]  escitalopram (LEXAPRO) 10 MG tablet Take 20 mg by mouth daily.    Yes [provider]  guaiFENesin (MUCINEX) 600 MG 12 hr tablet Take by mouth 2 (two) times daily.   Yes [provider]  HYDROcodone-acetaminophen (NORCO/VICODIN) 5-325 MG tablet Take 1 tablet by mouth 2 (two) times daily.   Yes [provider]  nitrofurantoin, macrocrystal-monohydrate, (MACROBID) 100 MG capsule Take 100 mg by mouth 2 (two) times daily.   Yes [provider]  Rivaroxaban (XARELTO) 15 MG TABS tablet Take 15 mg by mouth daily with supper.    Yes [provider]  traZODone (DESYREL) 50 MG tablet Take 50 mg by mouth at bedtime.   Yes [provider]    Physical Exam: Vitals:   06/06/18 1700 06/06/18 1800 06/06/18 1913 06/06/18 2209  BP: 111/62 129/74 129/62 (!) 120/53  Pulse:   83 72  Resp: 18 16 18 18   Temp:   98.4 F (36.9 C) 98.4 F (36.9 C)  TempSrc:   Oral Oral  SpO2: 98%  99% 97%  Weight:      Height:        Constitutional: NAD, calm, comfortable Vitals:   06/06/18 1700 06/06/18 1800 06/06/18 1913 06/06/18 2209  BP: 111/62 129/74 129/62 (!) 120/53  Pulse:   83 72  Resp: 18 16 18 18   Temp:   98.4 F (36.9 C) 98.4 F (36.9 C)  TempSrc:   Oral Oral  SpO2: 98%  99% 97%  Weight:      Height:       Physical Exam  Constitutional: She is oriented to person, place, and time. No distress.  HENT:  Head: Normocephalic and atraumatic.  Dry mucous membranes  Eyes: Pupils are equal, round, and reactive to light. EOM are normal.  Neck: Neck supple. No tracheal deviation present.  Cardiovascular: Normal rate, regular rhythm and intact distal pulses. Exam reveals no gallop and no friction rub.  Murmur heard. Pulmonary/Chest: Effort normal and breath sounds normal. No respiratory distress. She has no wheezes. She has no rales.  Abdominal: Soft. Bowel sounds are normal. She exhibits no distension. There is no tenderness.    Musculoskeletal: She exhibits no edema.  Right hand wrapped with bandage.  Neurological: She is alert and oriented to person, place, and time.  Strength 5/5 throughout  Skin: Skin is warm and dry.  Psychiatric: She has a normal mood and affect.    Labs on Admission: I have personally reviewed following labs and imaging studies  CBC: Recent Labs  Lab 06/06/18 1541  WBC 12.7*  NEUTROABS 8.7*  HGB 10.4*  HCT 32.1*  MCV 86.8  PLT 306   Basic Metabolic Panel: Recent Labs  Lab 06/06/18 1541  NA 138  K 3.8  CL 110  CO2 21*  GLUCOSE 117*  BUN 19  CREATININE 1.24*  CALCIUM 8.4*   GFR: Estimated Creatinine Clearance: 29.2 mL/min (A) (by C-G formula based on SCr of 1.24 mg/dL (H)). Liver Function Tests: Recent Labs  Lab 06/06/18 1541  AST 23  ALT 16  ALKPHOS 58  BILITOT 0.4  PROT 6.0*  ALBUMIN 3.1*   No results for input(s): LIPASE, AMYLASE in the last 168 hours. No results for input(s): AMMONIA in the last 168 hours. Coagulation Profile: No results for input(s): INR, PROTIME in the last 168 hours. Cardiac Enzymes: Recent Labs  Lab 06/06/18 1541 06/06/18 1956  TROPONINI 0.05* <0.03   BNP (last 3 results) No results for input(s): PROBNP in the last 8760 hours. HbA1C: No results for input(s): HGBA1C in the last 72 hours. CBG: No results for input(s): GLUCAP in the last 168 hours. Lipid Profile: No results for input(s): CHOL, HDL, LDLCALC, TRIG, CHOLHDL, LDLDIRECT in the last 72 hours. Thyroid Function Tests: No results for input(s): TSH, T4TOTAL, FREET4, T3FREE, THYROIDAB in the last 72 hours. Anemia Panel: No results for input(s): VITAMINB12, FOLATE, FERRITIN, TIBC, IRON, RETICCTPCT in the last 72 hours. Urine analysis:    Component Value Date/Time   COLORURINE AMBER (A) 06/06/2018 1457   APPEARANCEUR CLOUDY (A) 06/06/2018 1457   LABSPEC 1.015 06/06/2018 1457   PHURINE 6.0 06/06/2018 1457   GLUCOSEU NEGATIVE 06/06/2018 1457   HGBUR MODERATE (A)  06/06/2018 1457   BILIRUBINUR NEGATIVE 06/06/2018 1457   KETONESUR NEGATIVE 06/06/2018 1457   PROTEINUR 30 (A) 06/06/2018 1457   NITRITE NEGATIVE 06/06/2018 1457   LEUKOCYTESUR LARGE (A) 06/06/2018 1457    Radiological Exams on Admission: Dg Chest 2 View  Result Date: 06/06/2018 CLINICAL DATA:  Syncope. Fall. History of COPD. Initial encounter. EXAM: CHEST - 2 VIEW COMPARISON:  None. FINDINGS: The cardiac silhouette is borderline enlarged. The lungs are slightly hyperinflated with peribronchial thickening and interstitial coarsening. Mild scarring is noted in the right mid lung and left lung base. No sizable pleural effusion or pneumothorax is identified. No acute osseous abnormality is identified. IMPRESSION: Chronic bronchitic changes/COPD without evidence of acute abnormality. Electronically Signed   By: Sebastian Ache M.D.   On: 06/06/2018 16:05   Ct Head Wo Contrast  Result Date: 06/06/2018 CLINICAL DATA:  Dizziness, fall. EXAM: CT HEAD WITHOUT CONTRAST CT CERVICAL SPINE WITHOUT CONTRAST TECHNIQUE: Multidetector CT imaging of the head and cervical spine was performed following the standard protocol without intravenous contrast. Multiplanar CT image reconstructions of the cervical spine were also generated. COMPARISON:  None. FINDINGS: CT HEAD FINDINGS Brain: Mild diffuse cortical atrophy is noted. Mild chronic ischemic white matter disease is noted. No mass effect or midline shift is noted. Ventricular size is within normal limits. There is no evidence of mass lesion, hemorrhage or acute infarction. Vascular: No hyperdense vessel or unexpected calcification. Skull: Status post left temporal craniotomy. No acute abnormality is noted. Sinuses/Orbits: No acute finding. Other: None. CT CERVICAL SPINE FINDINGS Alignment: Normal. Skull base and vertebrae: No acute fracture. No primary bone lesion or focal pathologic process. Soft tissues and spinal canal: No prevertebral fluid or swelling. No visible  canal hematoma. Disc levels: Moderate degenerative disc disease is noted at C4-5 and C5-6. Upper chest: Negative. Other: Degenerative changes are seen involving the left-sided posterior facet joints. IMPRESSION: Mild diffuse cortical atrophy. Mild chronic ischemic white matter disease. No acute intracranial abnormality seen. Moderate multilevel degenerative disc disease. No acute abnormality seen in the cervical spine Electronically Signed   By: Lupita Raider, M.D.   On: 06/06/2018 16:53   Ct Cervical Spine Wo Contrast  Result Date: 06/06/2018 CLINICAL DATA:  Dizziness, fall. EXAM: CT HEAD WITHOUT CONTRAST CT CERVICAL SPINE WITHOUT CONTRAST TECHNIQUE:  Multidetector CT imaging of the head and cervical spine was performed following the standard protocol without intravenous contrast. Multiplanar CT image reconstructions of the cervical spine were also generated. COMPARISON:  None. FINDINGS: CT HEAD FINDINGS Brain: Mild diffuse cortical atrophy is noted. Mild chronic ischemic white matter disease is noted. No mass effect or midline shift is noted. Ventricular size is within normal limits. There is no evidence of mass lesion, hemorrhage or acute infarction. Vascular: No hyperdense vessel or unexpected calcification. Skull: Status post left temporal craniotomy. No acute abnormality is noted. Sinuses/Orbits: No acute finding. Other: None. CT CERVICAL SPINE FINDINGS Alignment: Normal. Skull base and vertebrae: No acute fracture. No primary bone lesion or focal pathologic process. Soft tissues and spinal canal: No prevertebral fluid or swelling. No visible canal hematoma. Disc levels: Moderate degenerative disc disease is noted at C4-5 and C5-6. Upper chest: Negative. Other: Degenerative changes are seen involving the left-sided posterior facet joints. IMPRESSION: Mild diffuse cortical atrophy. Mild chronic ischemic white matter disease. No acute intracranial abnormality seen. Moderate multilevel degenerative disc  disease. No acute abnormality seen in the cervical spine Electronically Signed   By: Lupita Raider, M.D.   On: 06/06/2018 16:53    EKG: Independently reviewed. Sinus rhythm with left bundle branch block (no prior EKG in chart for comparison)  Assessment/Plan Principal Problem:   UTI (urinary tract infection) Active Problems:   Fall   Dehydration   Hypotension   Elevated troponin   Degenerative disc disease, cervical   Normocytic anemia   AKI (acute kidney injury) (HCC)   GERD (gastroesophageal reflux disease)   Atrial flutter (HCC)   Anxiety  Fall and documented hypotension EMS noted a low blood pressure of 77/50 on arrival.  Fall likely secondary to hypotension and concurrent UTI.  Does not appear patient had a syncopal episode based on the history.  However, mild troponin elevation on admission and left bundle branch on EKG are concerning (no prior EKG for comparison). -Monitor on telemetry -Echo -Trend troponin -Fluid resuscitation -Check orthostatics  Right hand laceration -Bandaged in the ED. Per ED note, 4 cm Superficial Skin tear on the dorsal aspect of the right hand.  -Daily dressing changes -Continue to monitor  Urinary tract infection Labs showing leukocytosis (white blood cell count 12.7). Urinalysis showing moderate amount of hemoglobin, proteinuria, large amount of leukocytes, and negative nitrite.  Was recently treated with [facility] outpatient.  -IV ceftriaxone 1 g daily -CBC in a.m. -Urine culture pending  Dehydration -Fluid resuscitation  Troponin elevation and left bundle branch block on EKG No prior EKG in chart for comparison.  Unclear if the bundle branch block is new. Troponin 0.05 >> 0.03. ED physician Dr. Lynelle Doctor discussed the case with cardiology.  They will see the patient in the morning. -Cardiology consult -Repeat EKG in a.m. -Trend trops  Chronic neck pain secondary to degenerative disc disease CT C-spine showing moderate multilevel  degenerative disc disease and no acute abnormality. -Tylenol PRN  Normocytic anemia Hemoglobin 10.4 with normal MCV; no prior labs for comparison. No evidence of active bleeding. -Continue to monitor -CBC in a.m.  Mild AKI Likely prerenal secondary to dehydration.  Creatinine 1.2; no prior labs for comparison. -Fluid resuscitation -Avoid nephrotoxic medications  GERD -Continue PPI  Atrial flutter Currently in sinus rhythm.  CHA2DS2VASc 3.  -Continue home diltiazem 30 mg daily  Anxiety -Continue home Lexapro -Continue home trazodone -Continue home Xarelto  Deconditioning -PT eval  Diet: Regular  DVT prophylaxis: Xarelto Code Status: Patient  wishes to be DNR. Family Communication: Family at bedside has been updated. Disposition Plan: Anticipate discharge in 1 to 2 days. Consults called: Cardiology Admission status: Observation   John Giovanni MD [facility] Pager [phone]  If 7PM-7AM, please contact night-coverage [email or web address] Password [email or web address]  06/06/2018, 11:51 PM

## 2018-06-06 NOTE — ED Triage Notes (Signed)
EMS reports pt is here from out of town.  Reports pt fell early this morning.  Daughter found pt on floor and helped her up.  Pt had skin tear on r hand and the dressed it.  Reports pt has been more lethargic than usual today and pt c/o dizziness with position changes.  EMS reports pt was orthostatic.  Reports bp decreased to 77/50.  Pt says she has been feeling bad for " a long time."  Pt denies any pain at present but had neck pain recently.  Pt presently on antibiotics for UTI.  Reports started medication Monday.

## 2018-06-06 NOTE — ED Notes (Signed)
Dr Lynelle Doctor in to inform pt/family of findings and plan of care.

## 2018-06-07 ENCOUNTER — Observation Stay (HOSPITAL_COMMUNITY): Payer: Medicare Other

## 2018-06-07 ENCOUNTER — Encounter (HOSPITAL_COMMUNITY): Payer: Self-pay | Admitting: Physician Assistant

## 2018-06-07 ENCOUNTER — Observation Stay (HOSPITAL_BASED_OUTPATIENT_CLINIC_OR_DEPARTMENT_OTHER): Payer: Medicare Other

## 2018-06-07 DIAGNOSIS — M5030 Other cervical disc degeneration, unspecified cervical region: Secondary | ICD-10-CM | POA: Diagnosis not present

## 2018-06-07 DIAGNOSIS — N179 Acute kidney failure, unspecified: Secondary | ICD-10-CM

## 2018-06-07 DIAGNOSIS — K219 Gastro-esophageal reflux disease without esophagitis: Secondary | ICD-10-CM | POA: Diagnosis not present

## 2018-06-07 DIAGNOSIS — R7989 Other specified abnormal findings of blood chemistry: Secondary | ICD-10-CM

## 2018-06-07 DIAGNOSIS — E860 Dehydration: Secondary | ICD-10-CM

## 2018-06-07 DIAGNOSIS — F419 Anxiety disorder, unspecified: Secondary | ICD-10-CM | POA: Diagnosis not present

## 2018-06-07 DIAGNOSIS — I959 Hypotension, unspecified: Secondary | ICD-10-CM | POA: Diagnosis not present

## 2018-06-07 DIAGNOSIS — N390 Urinary tract infection, site not specified: Secondary | ICD-10-CM

## 2018-06-07 DIAGNOSIS — I340 Nonrheumatic mitral (valve) insufficiency: Secondary | ICD-10-CM | POA: Diagnosis not present

## 2018-06-07 DIAGNOSIS — Z7901 Long term (current) use of anticoagulants: Secondary | ICD-10-CM | POA: Diagnosis not present

## 2018-06-07 DIAGNOSIS — M542 Cervicalgia: Secondary | ICD-10-CM | POA: Diagnosis not present

## 2018-06-07 DIAGNOSIS — G8929 Other chronic pain: Secondary | ICD-10-CM | POA: Diagnosis not present

## 2018-06-07 DIAGNOSIS — W19XXXA Unspecified fall, initial encounter: Secondary | ICD-10-CM

## 2018-06-07 DIAGNOSIS — I4892 Unspecified atrial flutter: Secondary | ICD-10-CM

## 2018-06-07 DIAGNOSIS — I447 Left bundle-branch block, unspecified: Secondary | ICD-10-CM

## 2018-06-07 DIAGNOSIS — D649 Anemia, unspecified: Secondary | ICD-10-CM | POA: Diagnosis not present

## 2018-06-07 DIAGNOSIS — R5381 Other malaise: Secondary | ICD-10-CM | POA: Diagnosis not present

## 2018-06-07 DIAGNOSIS — I361 Nonrheumatic tricuspid (valve) insufficiency: Secondary | ICD-10-CM

## 2018-06-07 DIAGNOSIS — N189 Chronic kidney disease, unspecified: Secondary | ICD-10-CM | POA: Diagnosis not present

## 2018-06-07 DIAGNOSIS — S61411A Laceration without foreign body of right hand, initial encounter: Secondary | ICD-10-CM | POA: Diagnosis not present

## 2018-06-07 DIAGNOSIS — D500 Iron deficiency anemia secondary to blood loss (chronic): Secondary | ICD-10-CM | POA: Diagnosis not present

## 2018-06-07 LAB — TROPONIN I: Troponin I: <0.03 ng/mL (ref <0.03)

## 2018-06-07 LAB — BASIC METABOLIC PANEL
ANION GAP: 8 (ref 5–15)
BUN: 14 mg/dL (ref 8–23)
CALCIUM: 8.1 mg/dL — AB (ref 8.9–10.3)
CO2: 18 mmol/L — AB (ref 22–32)
Chloride: 112 mmol/L — ABNORMAL HIGH (ref 98–111)
Creatinine, Ser: 0.98 mg/dL (ref 0.44–1.00)
GFR, EST AFRICAN AMERICAN: 60 mL/min — AB (ref >60)
GFR, EST NON AFRICAN AMERICAN: 52 mL/min — AB (ref >60)
Glucose, Bld: 107 mg/dL — ABNORMAL HIGH (ref 70–99)
POTASSIUM: 3.6 mmol/L (ref 3.5–5.1)
Sodium: 138 mmol/L (ref 135–145)

## 2018-06-07 LAB — CBC
HCT: 30.2 % — ABNORMAL LOW (ref 36.0–46.0)
Hemoglobin: 9.8 g/dL — ABNORMAL LOW (ref 12.0–15.0)
MCH: 28 pg (ref 26.0–34.0)
MCHC: 32.5 g/dL (ref 30.0–36.0)
MCV: 86.3 fL (ref 78.0–100.0)
Platelets: 310 10*3/uL (ref 150–400)
RBC: 3.5 MIL/uL — AB (ref 3.87–5.11)
RDW: 14.5 % (ref 11.5–15.5)
WBC: 8.7 10*3/uL (ref 4.0–10.5)

## 2018-06-07 LAB — ECHOCARDIOGRAM COMPLETE
HEIGHTINCHES: 64 in
Weight: 2080 oz

## 2018-06-07 MED ORDER — CEFUROXIME AXETIL 250 MG PO TABS: 250.0000 mg | ORAL_TABLET | Freq: Two times a day (BID) | ORAL | 0 refills | Status: AC

## 2018-06-07 MED ORDER — SODIUM CHLORIDE 0.45 % IV SOLN
INTRAVENOUS | Status: DC
  Administered 2018-06-07: 12:00:00 via INTRAVENOUS
  Filled 2018-06-07 (×6): qty 1000

## 2018-06-07 MED ORDER — ENSURE ENLIVE PO LIQD: 1.0000 | ORAL | 0 refills | Status: AC

## 2018-06-07 MED ORDER — ENSURE ENLIVE PO LIQD
237.0000 mL | ORAL | Status: DC
  Administered 2018-06-07: 237 mL via ORAL

## 2018-06-07 NOTE — Plan of Care (Signed)
  Problem: Acute Rehab PT Goals(only PT should resolve) Goal: Pt Will Transfer Bed To Chair/Chair To Bed Outcome: Progressing Flowsheets (Taken 06/07/2018 1414) Pt will Transfer Bed to Chair/Chair to Bed: with modified independence Goal: Pt Will Ambulate Outcome: Progressing Flowsheets (Taken 06/07/2018 1414) Pt will Ambulate: 100 feet; with modified independence   2:15 PM, 06/07/18 Diana Mata, MPT Physical Therapist with [facility] [phone] office [phone] mobile phone

## 2018-06-07 NOTE — Discharge Summary (Addendum)
Physician Discharge Summary  Diana Mata ZOX:[id number] DOB: March 22, 1931 DOA: 06/06/2018  PCP: System, Pcp Not In  Admit date: 06/06/2018 Discharge date: 06/07/2018  Time spent: 45 minutes  Recommendations for Outpatient Follow-up:  Patient will be discharged to home.  Patient will need to follow up with primary care provider within one week of discharge. Follow up with cardiology. Patient should continue medications as prescribed.  Patient should follow a regular diet.   Discharge Diagnoses:  Elevated troponin with LBBB  Fall with Hypotension Urinary tract infection Chronic neck pain secondary to degenerative disc disease Right hand laceration Normocytic anemia Chronic renal insufficiency Atrial flutter GERD Anxiety Physical deconditioning with fall  Discharge Condition: Stable  Diet recommendation: regular  Filed Weights   06/06/18 1430  Weight: 59 kg    History of present illness:  on 06/06/2018 by Dr. Mauri Brooklyn Cheek-Eurichis a 84 y.o.femalewith medical history significant ofatrial flutter, chronic UTIs, IBSpresenting to the hospital for further evaluation of a fall at home. Patient reports having generalized weakness. States she felt lightheaded today and fell on the floor. Also reports having chronic neck pain. She does not think she sustained any injuries from this fall; denies hitting her head on the floor. States she chronically feels dizzy and takes meclizine at home. Patient denies having any chest pain or shortness of breath at the time of the fall. Reports having dysuria, urinary frequency, and urgency. Also reports having a poor appetite. Statesshe has chronic diarrhea due to IBS. Daughter at bedside states she found the patient on the floor and she was conscious at that time. She did not notice any tongue biting or bowel/bladder incontinence. States since after the fall this morning patient has been complaining of  lightheadedness and has appeared to be unsteady when walking which prompted the family to call EMS.Per daughter, patient was diagnosed with a UTI earlier this week and was prescribed Macrobid by her primary care doctor. Patient is visiting from [place] and has a cardiologist over there.  Hospital Course:  Elevated troponin with LBBB  -no prior EKG for comparison -Suspect secondary to recent fall -Troponins were cycled, peaked at 0.05, now <0.03 (times 3) -patient chest pain free -Echocardiogram: EF 60-65%, no regional wall motion abnormalities. Left ventricular diastolic function parameters are normal -Cardiology consulted and appreciated-would not plan for further inpatient cardiac testing, would need outpatient cardiology follow up  Fall with Hypotension -BP was noted to be 77/50 by EMS -Patient fell at home likely secondary to hypotension in the setting of UTI. -Patient did not lose consciousness  -PT consulted and recommended HH, however this was refused by patient and daughter -Continue IVF   Urinary tract infection -UA not convincing of UTI, however, patient was recently placed on macrobid earlier this week -Urine culture pending -continue ceftriaxone  Chronic neck pain secondary to degenerative disc disease -CT cervical spine showed moderate multilevel degenerative disc disease, no acute abnormality -Continue Tylenol as needed  Right hand laceration -Patient with superficial skin tear on dorsal aspect of the right hand -continue dressing changes  Normocytic anemia -Hemoglobin currently 9.8, was 10.4 on admission-suspect drop likely dilutional due to IV fluids -No prior labs for comparison -Continue to monitor CBC  Chronic renal insufficiency -Suspect patient has chronic kidney disease, stage III at baseline however no labs for comparison -Creatinine was 1.24 on admission, currently 0.98 -Continue to monitor BMP  Atrial flutter -Continue diltiazem,  Xarelto -Patient currently in sinus rhythm  GERD -Continue PPI  Anxiety -Continue Lexapro, trazodone  Physical deconditioning with fall -As above, PT consulted recommended home health however patient declined  Procedures: Echocardiogram  Consultations: Cardiology  Discharge Exam: Vitals:   06/07/18 1548 06/07/18 1550  BP: (!) 122/44 (!) 126/46  Pulse: 84 81  Resp:    Temp:    SpO2: 100% 100%     General: Well developed, well nourished, NAD, appears stated age  HEENT: NCAT,  mucous membranes moist.  Neck: Supple  Cardiovascular: S1 S2 auscultated, soft SEM, RRR.  Respiratory: Clear to auscultation bilaterally with equal chest rise  Abdomen: Soft, nontender, nondistended  + bowel sounds.  Extremities: warm dry without cyanosis clubbing or edema. Right hand laceration with dressing changes  Neuro: AAOx3, nonfocal  Skin: Without rashes exudates or nodules  Psych: Normal affect and demeanor with intact judgement and insight  Discharge Instructions Discharge Instructions    Discharge instructions   Complete by:  As directed    Patient will be discharged to home.  Patient will need to follow up with primary care provider within one week of discharge. Follow up with cardiology. Patient should continue medications as prescribed.  Patient should follow a regular diet.     Allergies as of 06/07/2018      Reactions   Levaquin [levofloxacin In D5w]    Penicillins    Has patient had a PCN reaction causing immediate rash, facial/tongue/throat swelling, SOB or lightheadedness with hypotension: No Has patient had a PCN reaction causing severe rash involving mucus membranes or skin necrosis: No Has patient had a PCN reaction that required hospitalization: No Has patient had a PCN reaction occurring within the last 10 years: No If all of the above answers are "NO", then may proceed with Cephalosporin use.   Sulfa Antibiotics    Tequin [gatifloxacin]        Medication List    STOP taking these medications   nitrofurantoin (macrocrystal-monohydrate) 100 MG capsule Commonly known as:  MACROBID     TAKE these medications   cefUROXime 250 MG tablet Commonly known as:  CEFTIN Take 1 tablet (250 mg total) by mouth 2 (two) times daily for 7 days.   dexlansoprazole 60 MG capsule Commonly known as:  DEXILANT Take 60 mg by mouth daily.   diazepam 5 MG tablet Commonly known as:  VALIUM Take 5-10 mg by mouth every 6 (six) hours as needed for anxiety.   diltiazem 30 MG tablet Commonly known as:  CARDIZEM Take 30 mg by mouth daily.   escitalopram 10 MG tablet Commonly known as:  LEXAPRO Take 20 mg by mouth daily.   feeding supplement (ENSURE ENLIVE) Liqd Take 237 mLs by mouth daily. Start taking on:  06/08/2018   guaiFENesin 600 MG 12 hr tablet Commonly known as:  MUCINEX Take by mouth 2 (two) times daily.   HYDROcodone-acetaminophen 5-325 MG tablet Commonly known as:  NORCO/VICODIN Take 1 tablet by mouth 2 (two) times daily.   traZODone 50 MG tablet Commonly known as:  DESYREL Take 50 mg by mouth at bedtime.   XARELTO 15 MG Tabs tablet Generic drug:  Rivaroxaban Take 15 mg by mouth daily with supper.      Allergies  Allergen Reactions  . Levaquin [Levofloxacin In D5w]   . Penicillins     Has patient had a PCN reaction causing immediate rash, facial/tongue/throat swelling, SOB or lightheadedness with hypotension: No Has patient had a PCN reaction causing severe rash involving mucus membranes or skin necrosis: No Has patient had a PCN reaction that required hospitalization:  No Has patient had a PCN reaction occurring within the last 10 years: No If all of the above answers are "NO", then may proceed with Cephalosporin use.   . Sulfa Antibiotics   . Tequin [Gatifloxacin]       The results of significant diagnostics from this hospitalization (including imaging, microbiology, ancillary and laboratory) are listed below  for reference.    Significant Diagnostic Studies: Dg Chest 2 View  Result Date: 06/06/2018 CLINICAL DATA:  Syncope. Fall. History of COPD. Initial encounter. EXAM: CHEST - 2 VIEW COMPARISON:  None. FINDINGS: The cardiac silhouette is borderline enlarged. The lungs are slightly hyperinflated with peribronchial thickening and interstitial coarsening. Mild scarring is noted in the right mid lung and left lung base. No sizable pleural effusion or pneumothorax is identified. No acute osseous abnormality is identified. IMPRESSION: Chronic bronchitic changes/COPD without evidence of acute abnormality. Electronically Signed   By: Sebastian Ache M.D.   On: 06/06/2018 16:05   Ct Head Wo Contrast  Result Date: 06/06/2018 CLINICAL DATA:  Dizziness, fall. EXAM: CT HEAD WITHOUT CONTRAST CT CERVICAL SPINE WITHOUT CONTRAST TECHNIQUE: Multidetector CT imaging of the head and cervical spine was performed following the standard protocol without intravenous contrast. Multiplanar CT image reconstructions of the cervical spine were also generated. COMPARISON:  None. FINDINGS: CT HEAD FINDINGS Brain: Mild diffuse cortical atrophy is noted. Mild chronic ischemic white matter disease is noted. No mass effect or midline shift is noted. Ventricular size is within normal limits. There is no evidence of mass lesion, hemorrhage or acute infarction. Vascular: No hyperdense vessel or unexpected calcification. Skull: Status post left temporal craniotomy. No acute abnormality is noted. Sinuses/Orbits: No acute finding. Other: None. CT CERVICAL SPINE FINDINGS Alignment: Normal. Skull base and vertebrae: No acute fracture. No primary bone lesion or focal pathologic process. Soft tissues and spinal canal: No prevertebral fluid or swelling. No visible canal hematoma. Disc levels: Moderate degenerative disc disease is noted at C4-5 and C5-6. Upper chest: Negative. Other: Degenerative changes are seen involving the left-sided posterior facet  joints. IMPRESSION: Mild diffuse cortical atrophy. Mild chronic ischemic white matter disease. No acute intracranial abnormality seen. Moderate multilevel degenerative disc disease. No acute abnormality seen in the cervical spine Electronically Signed   By: Lupita Raider, M.D.   On: 06/06/2018 16:53   Ct Cervical Spine Wo Contrast  Result Date: 06/06/2018 CLINICAL DATA:  Dizziness, fall. EXAM: CT HEAD WITHOUT CONTRAST CT CERVICAL SPINE WITHOUT CONTRAST TECHNIQUE: Multidetector CT imaging of the head and cervical spine was performed following the standard protocol without intravenous contrast. Multiplanar CT image reconstructions of the cervical spine were also generated. COMPARISON:  None. FINDINGS: CT HEAD FINDINGS Brain: Mild diffuse cortical atrophy is noted. Mild chronic ischemic white matter disease is noted. No mass effect or midline shift is noted. Ventricular size is within normal limits. There is no evidence of mass lesion, hemorrhage or acute infarction. Vascular: No hyperdense vessel or unexpected calcification. Skull: Status post left temporal craniotomy. No acute abnormality is noted. Sinuses/Orbits: No acute finding. Other: None. CT CERVICAL SPINE FINDINGS Alignment: Normal. Skull base and vertebrae: No acute fracture. No primary bone lesion or focal pathologic process. Soft tissues and spinal canal: No prevertebral fluid or swelling. No visible canal hematoma. Disc levels: Moderate degenerative disc disease is noted at C4-5 and C5-6. Upper chest: Negative. Other: Degenerative changes are seen involving the left-sided posterior facet joints. IMPRESSION: Mild diffuse cortical atrophy. Mild chronic ischemic white matter disease. No acute intracranial abnormality seen.  Moderate multilevel degenerative disc disease. No acute abnormality seen in the cervical spine Electronically Signed   By: Lupita Raider, M.D.   On: 06/06/2018 16:53    Microbiology: No results found for this or any previous  visit (from the past 240 hour(s)).   Labs: Basic Metabolic Panel: Recent Labs  Lab 06/06/18 1541 06/07/18 0129  NA 138 138  K 3.8 3.6  CL 110 112*  CO2 21* 18*  GLUCOSE 117* 107*  BUN 19 14  CREATININE 1.24* 0.98  CALCIUM 8.4* 8.1*   Liver Function Tests: Recent Labs  Lab 06/06/18 1541  AST 23  ALT 16  ALKPHOS 58  BILITOT 0.4  PROT 6.0*  ALBUMIN 3.1*   No results for input(s): LIPASE, AMYLASE in the last 168 hours. No results for input(s): AMMONIA in the last 168 hours. CBC: Recent Labs  Lab 06/06/18 1541 06/07/18 0129  WBC 12.7* 8.7  NEUTROABS 8.7*  --   HGB 10.4* 9.8*  HCT 32.1* 30.2*  MCV 86.8 86.3  PLT 306 310   Cardiac Enzymes: Recent Labs  Lab 06/06/18 1541 06/06/18 1956 06/07/18 0129 06/07/18 0805  TROPONINI 0.05* <0.03 <0.03 <0.03   BNP: BNP (last 3 results) No results for input(s): BNP in the last 8760 hours.  ProBNP (last 3 results) No results for input(s): PROBNP in the last 8760 hours.  CBG: No results for input(s): GLUCAP in the last 168 hours.     Signed:  Edsel Petrin  [facility] 06/07/2018, 5:13 PM

## 2018-06-07 NOTE — Care Management (Signed)
CM to assess at bedside. Pt [place] with daughter [place] of state and here staying with daughter for a few weeks. Pt alert and oriented, ind with ADL's. Pt recommended for [facility] PT but pt/daughter declines. Pt does not want rollater recommended. Pt/family anticipates DC home with self care.

## 2018-06-07 NOTE — Care Management Obs Status (Signed)
MEDICARE OBSERVATION STATUS NOTIFICATION   Patient Details  Name: Diana Mata MRN: [id number] Date of Birth: April 19, 1931   Medicare Observation Status Notification Given:  Yes    Malcolm Metro, RN 06/07/2018, 1:56 PM

## 2018-06-07 NOTE — Progress Notes (Signed)
*  PRELIMINARY RESULTS* Echocardiogram 2D Echocardiogram has been performed.  Diana Mata 06/07/2018, 4:19 PM

## 2018-06-07 NOTE — Discharge Instructions (Signed)

## 2018-06-07 NOTE — Progress Notes (Signed)
PROGRESS NOTE    Diana Mata  WUJ:[id number] DOB: 05/12/1931 DOA: 06/06/2018 PCP: System, Pcp Not In   Brief Narrative:  HPI on 06/06/2018 by Dr. John Giovanni Diana Mata is a 84 y.o. female with medical history significant of atrial flutter, chronic UTIs, IBS presenting to the hospital for further evaluation of a fall at home.  Patient reports having generalized weakness.  States she felt lightheaded today and fell on the floor.  Also reports having chronic neck pain.  She does not think she sustained any injuries from this fall; denies hitting her head on the floor.  States she chronically feels dizzy and takes meclizine at home.  Patient denies having any chest pain or shortness of breath at the time of the fall.  Reports having dysuria, urinary frequency, and urgency.  Also reports having a poor appetite. States she has chronic diarrhea due to IBS.  Daughter at bedside states she  found the patient on the floor and she was conscious at that time.  She did not notice any tongue biting or bowel/ bladder incontinence.  States since after the fall this morning patient has been complaining of lightheadedness and has appeared to be unsteady when walking which prompted the family to call EMS.  Per daughter, patient was diagnosed with a UTI earlier this week and was prescribed Macrobid by her primary care doctor.  Patient is visiting from [place] and has a cardiologist over there.  Interim history Admitted for fall with hypotension, UTI, LBBB. Cardiology consulted and appreciated. Pending urine culture and echocardiogram.  Assessment & Plan   Elevated troponin with LBBB  -no prior EKG for comparison -Suspect secondary to recent fall -Troponins were cycled, peaked at 0.05, now <0.03 (times 3) -patient chest pain free -Cardiology consulted and appreciated  Fall with Hypotension -BP was noted to be 77/50 by EMS -Patient fell at home likely secondary to hypotension in the  setting of UTI. -Patient did not lose consciousness  -PT consulted and recommended HH, however this was refused by patient and daughter -Continue IVF   Urinary tract infection -UA not convincing of UTI, however, patient was recently placed on macrobid earlier this week -Urine culture pending -continue ceftriaxone  Chronic neck pain secondary to degenerative disc disease -CT cervical spine showed moderate multilevel degenerative disc disease, no acute abnormality -Continue Tylenol as needed  Right hand laceration -Patient with superficial skin tear on dorsal aspect of the right hand -continue dressing changes  Normocytic anemia -Hemoglobin currently 9.8, was 10.4 on admission-suspect drop likely dilutional due to IV fluids -No prior labs for comparison -Continue to monitor CBC  Chronic renal insufficiency -Suspect patient has chronic kidney disease, stage III at baseline however no labs for comparison -Creatinine was 1.24 on admission, currently 0.98 -Continue to monitor BMP  Atrial flutter -Continue diltiazem, Xarelto -Patient currently in sinus rhythm  GERD -Continue PPI  Anxiety -Continue Lexapro, trazodone  Physical deconditioning with fall -As above, PT consulted recommended home health however patient declined  DVT Prophylaxis  Xarelto  Code Status: DNR  Family Communication: Daughter at bedside  Disposition Plan: observation  Consultants Cardiology  Procedures  None  Antibiotics   Anti-infectives (From admission, onward)   Start     Dose/Rate Route Frequency Ordered Stop   06/07/18 1800  cefTRIAXone (ROCEPHIN) 1 g in sodium chloride 0.9 % 100 mL IVPB     1 g 200 mL/hr over 30 Minutes Intravenous Every 24 hours 06/06/18 1930     06/06/18 1745  cefTRIAXone (  ROCEPHIN) 1 g in sodium chloride 0.9 % 100 mL IVPB     1 g 200 mL/hr over 30 Minutes Intravenous  Once 06/06/18 1736 06/06/18 1821      Subjective:   Diana Mata seen and examined  today.  Has no complaints this morning other than wanting to go home. Denies current chest pain, shortness of breath, abdominal pain, N/V/C. Has history of IBS. Does complain of burning with urination.   Objective:   Vitals:   06/06/18 1913 06/06/18 2209 06/07/18 0459 06/07/18 1452  BP: 129/62 (!) 120/53 (!) 118/47 (!) 120/58  Pulse: 83 72 70 71  Resp: 18 18 18 18   Temp: 98.4 F (36.9 C) 98.4 F (36.9 C) 98.4 F (36.9 C)   TempSrc: Oral Oral Oral   SpO2: 99% 97% 95% 97%  Weight:      Height:        Intake/Output Summary (Last 24 hours) at 06/07/2018 1520 Last data filed at 06/07/2018 1230 Gross per 24 hour  Intake 2220.52 ml  Output -  Net 2220.52 ml   Filed Weights   06/06/18 1430  Weight: 59 kg    Exam  General: Well developed, well nourished, NAD, appears stated age  HEENT: NCAT, mucous membranes moist.   Neck: Supple  Cardiovascular: S1 S2 auscultated, soft SEM, RRR  Respiratory: Clear to auscultation bilaterally with equal chest rise  Abdomen: Soft, nontender, nondistended, + bowel sounds  Extremities: warm dry without cyanosis clubbing. Trace LE edema. Right hand with dressing in place  Neuro: AAOx3, nonfocal  Psych: Normal affect and demeanor with intact judgement and insight   Data Reviewed: I have personally reviewed following labs and imaging studies  CBC: Recent Labs  Lab 06/06/18 1541 06/07/18 0129  WBC 12.7* 8.7  NEUTROABS 8.7*  --   HGB 10.4* 9.8*  HCT 32.1* 30.2*  MCV 86.8 86.3  PLT 306 310   Basic Metabolic Panel: Recent Labs  Lab 06/06/18 1541 06/07/18 0129  NA 138 138  K 3.8 3.6  CL 110 112*  CO2 21* 18*  GLUCOSE 117* 107*  BUN 19 14  CREATININE 1.24* 0.98  CALCIUM 8.4* 8.1*   GFR: Estimated Creatinine Clearance: 36.9 mL/min (by C-G formula based on SCr of 0.98 mg/dL). Liver Function Tests: Recent Labs  Lab 06/06/18 1541  AST 23  ALT 16  ALKPHOS 58  BILITOT 0.4  PROT 6.0*  ALBUMIN 3.1*   No results for  input(s): LIPASE, AMYLASE in the last 168 hours. No results for input(s): AMMONIA in the last 168 hours. Coagulation Profile: No results for input(s): INR, PROTIME in the last 168 hours. Cardiac Enzymes: Recent Labs  Lab 06/06/18 1541 06/06/18 1956 06/07/18 0129 06/07/18 0805  TROPONINI 0.05* <0.03 <0.03 <0.03   BNP (last 3 results) No results for input(s): PROBNP in the last 8760 hours. HbA1C: No results for input(s): HGBA1C in the last 72 hours. CBG: No results for input(s): GLUCAP in the last 168 hours. Lipid Profile: No results for input(s): CHOL, HDL, LDLCALC, TRIG, CHOLHDL, LDLDIRECT in the last 72 hours. Thyroid Function Tests: No results for input(s): TSH, T4TOTAL, FREET4, T3FREE, THYROIDAB in the last 72 hours. Anemia Panel: No results for input(s): VITAMINB12, FOLATE, FERRITIN, TIBC, IRON, RETICCTPCT in the last 72 hours. Urine analysis:    Component Value Date/Time   COLORURINE AMBER (A) 06/06/2018 1457   APPEARANCEUR CLOUDY (A) 06/06/2018 1457   LABSPEC 1.015 06/06/2018 1457   PHURINE 6.0 06/06/2018 1457   GLUCOSEU NEGATIVE 06/06/2018  1457   HGBUR MODERATE (A) 06/06/2018 1457   BILIRUBINUR NEGATIVE 06/06/2018 1457   KETONESUR NEGATIVE 06/06/2018 1457   PROTEINUR 30 (A) 06/06/2018 1457   NITRITE NEGATIVE 06/06/2018 1457   LEUKOCYTESUR LARGE (A) 06/06/2018 1457   Sepsis Labs: @LABRCNTIP (procalcitonin:4,lacticidven:4)  )No results found for this or any previous visit (from the past 240 hour(s)).    Radiology Studies: Dg Chest 2 View  Result Date: 06/06/2018 CLINICAL DATA:  Syncope. Fall. History of COPD. Initial encounter. EXAM: CHEST - 2 VIEW COMPARISON:  None. FINDINGS: The cardiac silhouette is borderline enlarged. The lungs are slightly hyperinflated with peribronchial thickening and interstitial coarsening. Mild scarring is noted in the right mid lung and left lung base. No sizable pleural effusion or pneumothorax is identified. No acute osseous  abnormality is identified. IMPRESSION: Chronic bronchitic changes/COPD without evidence of acute abnormality. Electronically Signed   By: Sebastian Ache M.D.   On: 06/06/2018 16:05   Ct Head Wo Contrast  Result Date: 06/06/2018 CLINICAL DATA:  Dizziness, fall. EXAM: CT HEAD WITHOUT CONTRAST CT CERVICAL SPINE WITHOUT CONTRAST TECHNIQUE: Multidetector CT imaging of the head and cervical spine was performed following the standard protocol without intravenous contrast. Multiplanar CT image reconstructions of the cervical spine were also generated. COMPARISON:  None. FINDINGS: CT HEAD FINDINGS Brain: Mild diffuse cortical atrophy is noted. Mild chronic ischemic white matter disease is noted. No mass effect or midline shift is noted. Ventricular size is within normal limits. There is no evidence of mass lesion, hemorrhage or acute infarction. Vascular: No hyperdense vessel or unexpected calcification. Skull: Status post left temporal craniotomy. No acute abnormality is noted. Sinuses/Orbits: No acute finding. Other: None. CT CERVICAL SPINE FINDINGS Alignment: Normal. Skull base and vertebrae: No acute fracture. No primary bone lesion or focal pathologic process. Soft tissues and spinal canal: No prevertebral fluid or swelling. No visible canal hematoma. Disc levels: Moderate degenerative disc disease is noted at C4-5 and C5-6. Upper chest: Negative. Other: Degenerative changes are seen involving the left-sided posterior facet joints. IMPRESSION: Mild diffuse cortical atrophy. Mild chronic ischemic white matter disease. No acute intracranial abnormality seen. Moderate multilevel degenerative disc disease. No acute abnormality seen in the cervical spine Electronically Signed   By: Lupita Raider, M.D.   On: 06/06/2018 16:53   Ct Cervical Spine Wo Contrast  Result Date: 06/06/2018 CLINICAL DATA:  Dizziness, fall. EXAM: CT HEAD WITHOUT CONTRAST CT CERVICAL SPINE WITHOUT CONTRAST TECHNIQUE: Multidetector CT imaging of  the head and cervical spine was performed following the standard protocol without intravenous contrast. Multiplanar CT image reconstructions of the cervical spine were also generated. COMPARISON:  None. FINDINGS: CT HEAD FINDINGS Brain: Mild diffuse cortical atrophy is noted. Mild chronic ischemic white matter disease is noted. No mass effect or midline shift is noted. Ventricular size is within normal limits. There is no evidence of mass lesion, hemorrhage or acute infarction. Vascular: No hyperdense vessel or unexpected calcification. Skull: Status post left temporal craniotomy. No acute abnormality is noted. Sinuses/Orbits: No acute finding. Other: None. CT CERVICAL SPINE FINDINGS Alignment: Normal. Skull base and vertebrae: No acute fracture. No primary bone lesion or focal pathologic process. Soft tissues and spinal canal: No prevertebral fluid or swelling. No visible canal hematoma. Disc levels: Moderate degenerative disc disease is noted at C4-5 and C5-6. Upper chest: Negative. Other: Degenerative changes are seen involving the left-sided posterior facet joints. IMPRESSION: Mild diffuse cortical atrophy. Mild chronic ischemic white matter disease. No acute intracranial abnormality seen. Moderate multilevel degenerative disc  disease. No acute abnormality seen in the cervical spine Electronically Signed   By: Lupita Raider, M.D.   On: 06/06/2018 16:53     Scheduled Meds: . diltiazem  30 mg Oral Daily  . escitalopram  20 mg Oral Daily  . feeding supplement (ENSURE ENLIVE)  237 mL Oral Q24H  . pantoprazole  40 mg Oral Daily  . rivaroxaban  15 mg Oral Q supper  . traZODone  50 mg Oral QHS   Continuous Infusions: . cefTRIAXone (ROCEPHIN)  IV    . sodium chloride 0.45 % 1,000 mL with sodium bicarbonate 100 mEq infusion 75 mL/hr at 06/07/18 1142     LOS: 0 days   Time Spent in minutes   30 minutes  Ansel Ferrall D.O. on 06/07/2018 at 3:20 PM  Between 7am to 7pm - Please see pager noted on  [email or web address]  After 7pm go to [email or web address]  And look for the night coverage person covering for me after hours  [facility] Office  [phone]

## 2018-06-07 NOTE — Progress Notes (Signed)
Initial Nutrition Assessment  DOCUMENTATION CODES:   Not applicable  INTERVENTION:  Regular diet   Snack daily as desired   NUTRITION DIAGNOSIS:   Inadequate oral intake related to decreased appetite as evidenced by meal completion < 50%.   GOAL:   Patient will meet greater than or equal to 90% of their needs   MONITOR:   PO intake, Supplement acceptance, Labs, Weight trends  REASON FOR ASSESSMENT:   Consult Assessment of nutrition requirement/status  ASSESSMENT: Patient is an 84 yo female who presents after falling at the home of her daughter.  Hx of chronic UTI's, IBS and atrial flutter. She is here visiting from [place].  [name] endorses a good appetite yet meal intake currently is poor 25%. She denies recent changes in body weight. Daughter affirms same. Patient at risk for malnutrition related to her current poor oral intake, chronic UTI's, hx of IBS and advanced age.   Labs: BMP Latest Ref Rng & Units 06/07/2018 06/06/2018  Glucose 70 - 99 mg/dL 161(W) 960(A)  BUN 8 - 23 mg/dL 14 19  Creatinine 5.40 - 1.00 mg/dL 9.81 1.91(Y)  Sodium 782 - 145 mmol/L 138 138  Potassium 3.5 - 5.1 mmol/L 3.6 3.8  Chloride 98 - 111 mmol/L 112(H) 110  CO2 22 - 32 mmol/L 18(L) 21(L)  Calcium 8.9 - 10.3 mg/dL 8.1(L) 8.4(L)    Medications reviewed and include: protonix, lexapro, xarelto, cardizem   NUTRITION - FOCUSED PHYSICAL EXAM: WDL   Diet Order:   Diet Order            Diet regular Room service appropriate? Yes; Fluid consistency: Thin  Diet effective now              EDUCATION NEEDS:   No education needs have been identified at this time  Skin:  Skin Assessment: Reviewed RN Assessment  Last BM:  10/2  Height:   Ht Readings from Last 1 Encounters:  06/06/18 5\' 4"  (1.626 m)    Weight:   Wt Readings from Last 1 Encounters:  06/06/18 59 kg    Ideal Body Weight:  55 kg  BMI:  Body mass index is 22.31 kg/m.  Estimated Nutritional Needs:   Kcal:   9562-1308 (25-28 kcal/kg/bw)  Protein:  70-75 gr (~1.2 gr/kg/bw)  Fluid:  1.5-1.7 liters daily   Royann Shivers MS,RD,CSG,LDN Office: [phone] Pager: [phone]

## 2018-06-07 NOTE — Evaluation (Signed)
Physical Therapy Evaluation Patient Details Name: Stephonie Wilcoxen MRN: [id number] DOB: 12/20/1930 Today's Date: 06/07/2018   History of Present Illness  Lawana Cheek-Eurich is a 84 y.o. female with medical history significant of atrial flutter, chronic UTIs, IBS presenting to the hospital for further evaluation of a fall at home.  Patient reports having generalized weakness.  States she felt lightheaded today and fell on the floor.  Also reports having chronic neck pain.  She does not think she sustained any injuries from this fall; denies hitting her head on the floor.  States she chronically feels dizzy and takes meclizine at home.  Patient denies having any chest pain or shortness of breath at the time of the fall.  Reports having dysuria, urinary frequency, and urgency.  Also reports having a poor appetite. States she has chronic diarrhea due to IBS.  Daughter at bedside states she  found the patient on the floor and she was conscious at that time.  She did not notice any tongue biting or bowel/ bladder incontinence.  States since after the fall this morning patient has been complaining of lightheadedness and has appeared to be unsteady when walking which prompted the family to call EMS.  Per daughter, patient was diagnosed with a UTI earlier this week and was prescribed Macrobid by her primary care doctor.  Patient is visiting from [place] and has a cardiologist over there.    Clinical Impression  Patient functioning near baseline for functional mobility and gait other than limited for gait secondary to c/o fatigue and slight unsteadiness during gait training requiring use of single point cane [facility]) and/or hand held assist.  Patient able to transfer from bed to chair without problem, but requiring assistance for ambulating in room and hallways and demonstrates good return for using RW.  Patient tolerated sitting up in chair after therapy with her daughter present in room.  Patient will benefit  from continued physical therapy in hospital and recommended venue below to increase strength, balance, endurance for safe ADLs and gait.     Follow Up Recommendations Home health PT;Supervision - Intermittent;Supervision for mobility/OOB    Equipment Recommendations  Rolling walker with 5" wheels(Rollator (4 wheeled walker with seat and brakes))    Recommendations for Other Services       Precautions / Restrictions Precautions Precautions: Fall Restrictions Weight Bearing Restrictions: No      Mobility  Bed Mobility Overal bed mobility: Modified Independent                Transfers Overall transfer level: Modified independent Equipment used: None;Straight cane             General transfer comment: increased time  Ambulation/Gait Ambulation/Gait assistance: Min guard Gait Distance (Feet): 75 Feet Assistive device: Straight cane;1 person hand held assist Gait Pattern/deviations: Decreased step length - right;Decreased step length - left;Decreased stride length;Step-through pattern Gait velocity: decreased   General Gait Details: demonstrates slightly labored slower than normal cadence with tendency to lean on nearby objects for support and requested hand held assist once fatigued while using SPC, patient demonstrates good return for using RW  Stairs            Wheelchair Mobility    Modified Rankin (Stroke Patients Only)       Balance Overall balance assessment: Needs assistance Sitting-balance support: Feet supported;No upper extremity supported Sitting balance-Leahy Scale: Good     Standing balance support: During functional activity;No upper extremity supported Standing balance-Leahy Scale: Fair Standing balance comment: fair/good  with hand held assist or using SPC                             Pertinent Vitals/Pain Pain Assessment: 0-10 Pain Score: 9  Pain Location: bilateral sides of neck Pain Descriptors / Indicators:  Aching;Discomfort Pain Intervention(s): Limited activity within patient's tolerance;Monitored during session    Home Living Family/patient expects to be discharged to:: Private residence Living Arrangements: Children(daughter) Available Help at Discharge: Family Type of Home: Other(Comment)(townhome) Home Access: Stairs to enter Entrance Stairs-Rails: None Entrance Stairs-Number of Steps: 1 Home Layout: One level Home Equipment: Cane - single point      Prior Function Level of Independence: Independent         Comments: community Geneticist, molecular        Extremity/Trunk Assessment   Upper Extremity Assessment Upper Extremity Assessment: Generalized weakness    Lower Extremity Assessment Lower Extremity Assessment: Generalized weakness    Cervical / Trunk Assessment Cervical / Trunk Assessment: Normal  Communication   Communication: No difficulties  Cognition Arousal/Alertness: Awake/alert Behavior During Therapy: WFL for tasks assessed/performed Overall Cognitive Status: Within Functional Limits for tasks assessed                                        General Comments      Exercises     Assessment/Plan    PT Assessment Patient needs continued PT services  PT Problem List Decreased strength;Decreased activity tolerance;Decreased balance;Decreased mobility       PT Treatment Interventions Gait training;Stair training;Functional mobility training;Therapeutic activities;Therapeutic exercise;Patient/family education    PT Goals (Current goals can be found in the Care Plan section)  Acute Rehab PT Goals Patient Stated Goal: return home with family to assist PT Goal Formulation: With patient/family Time For Goal Achievement: 06/10/18 Potential to Achieve Goals: Good    Frequency Min 3X/week   Barriers to discharge        Co-evaluation               AM-PAC PT "6 Clicks" Daily Activity  Outcome Measure Difficulty  turning over in bed (including adjusting bedclothes, sheets and blankets)?: None Difficulty moving from lying on back to sitting on the side of the bed? : None Difficulty sitting down on and standing up from a chair with arms (e.g., wheelchair, bedside commode, etc,.)?: None Help needed moving to and from a bed to chair (including a wheelchair)?: None Help needed walking in hospital room?: A Little Help needed climbing 3-5 steps with a railing? : A Little 6 Click Score: 22    End of Session   Activity Tolerance: Patient tolerated treatment well;Patient limited by fatigue Patient left: in chair;with call bell/phone within reach;with family/visitor present;with chair alarm set Nurse Communication: Mobility status PT Visit Diagnosis: Unsteadiness on feet (R26.81);Other abnormalities of gait and mobility (R26.89);Muscle weakness (generalized) (M62.81)    Time: 1610-9604 PT Time Calculation (min) (ACUTE ONLY): 29 min   Charges:   PT Evaluation $PT Eval Moderate Complexity: 1 Mod PT Treatments $Therapeutic Activity: 23-37 mins        2:12 PM, 06/07/18 Ocie Bob, MPT Physical Therapist with [facility] [phone] office [phone] mobile phone w

## 2018-06-07 NOTE — Progress Notes (Signed)
Pt's IV catheter removed and intact. Pt's IV site clean dry and intact. Discharge instructions including medications were reviewed and discussed with patient's daughter Hildred Priest. All questions were answered and no further questions at this time. Pt in stable condition and in no acute distress at time of discharge. Pt escorted by nurse tech

## 2018-06-07 NOTE — Consult Note (Addendum)
Cardiology Consultation:   Patient ID: Diana Mata; [id number]; 06-Feb-1931   Admit date: 06/06/2018 Date of Consult: 06/07/2018  Primary Care Provider: System, Pcp Not In Primary Cardiologist: New, Dr Wyline Mood Primary Electrophysiologist:  none   Patient Profile:   Diana Mata is a 84 y.o. female with a hx of IBS, atrial flutter, GERD, COPD, CKD, who is being seen today for the evaluation of LBBB and trop elevation at the request of Dr Catha Gosselin.  History of Present Illness:   Diana Mata has a history of atrial flutter and has had some chest pain in the past.  However, she has not had any palpitations or chest pain since moving to [place] in August.  She was admitted 10/3 after a fall, patient was conscious when found by her daughter.  She was diagnosed with a UTI earlier this week and is on medication.  Blood pressure 77/50, improved after hydration.  P.o. intake had admittedly been poor recently.  Cardiology was asked to evaluate her because the left bundle [name] block is of unknown duration and her troponin showed some elevation on admission.  The patient has not had any chest pain since moving to [place].  She has not had any palpitations and has not felt that her heart rate was out of rhythm.  Prior to moving to [place], she was noted to have significant dyspnea on exertion.  When she walks her dog, she has to take frequent rest breaks.  The dyspnea on exertion will get to the point where she has to stop and sit down.  One time, she had to sit down and there were no chairs so she sat on the curb but could not get back up without assistance.  She denies orthopnea or PND.  She has some daytime lower extremity edema, says it is worse if she sits around.  She does not notice the edema if she is up and walking and busy.  She has some chronic issues with dizziness, for which she takes meclizine.   Past Medical History:  Diagnosis Date  . Atrial  flutter (HCC)   . Chronic UTI   . COPD (chronic obstructive pulmonary disease) (HCC)   . GERD (gastroesophageal reflux disease)   . IBS (irritable bowel syndrome)   . Renal disorder    renal insufficiency    Past Surgical History:  Procedure Laterality Date  . ABDOMINAL HYSTERECTOMY    . 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 JOINT REPLACEMENT       Prior to Admission medications   Medication Sig Start Date End Date Taking? Authorizing Provider  dexlansoprazole (DEXILANT) 60 MG capsule Take 60 mg by mouth daily.   Yes [provider]  diazepam (VALIUM) 5 MG tablet Take 5-10 mg by mouth every 6 (six) hours as needed for anxiety.   Yes [provider]  diltiazem (CARDIZEM) 30 MG tablet Take 30 mg by mouth daily.   Yes [provider]  escitalopram (LEXAPRO) 10 MG tablet Take 20 mg by mouth daily.    Yes [provider]  guaiFENesin (MUCINEX) 600 MG 12 hr tablet Take by mouth 2 (two) times daily.   Yes [provider]  HYDROcodone-acetaminophen (NORCO/VICODIN) 5-325 MG tablet Take 1 tablet by mouth 2 (two) times daily.   Yes [provider]  nitrofurantoin, macrocrystal-monohydrate, (MACROBID) 100 MG capsule Take 100 mg by mouth 2 (two) times daily.   Yes [provider]  Rivaroxaban (XARELTO) 15 MG TABS tablet Take  15 mg by mouth daily with supper.    Yes [provider]  traZODone (DESYREL) 50 MG tablet Take 50 mg by mouth at bedtime.   Yes [provider]    Inpatient Medications: Scheduled Meds: . diltiazem  30 mg Oral Daily  . escitalopram  20 mg Oral Daily  . pantoprazole  40 mg Oral Daily  . rivaroxaban  15 mg Oral Q supper  . traZODone  50 mg Oral QHS   Continuous Infusions: . cefTRIAXone (ROCEPHIN)  IV    . sodium chloride 0.45 % 1,000 mL with sodium bicarbonate 100 mEq infusion     PRN Meds: acetaminophen **OR** acetaminophen  Allergies:    Allergies  Allergen Reactions  . Levaquin  [Levofloxacin In D5w]   . Penicillins     Has patient had a PCN reaction causing immediate rash, facial/tongue/throat swelling, SOB or lightheadedness with hypotension: No Has patient had a PCN reaction causing severe rash involving mucus membranes or skin necrosis: No Has patient had a PCN reaction that required hospitalization: No Has patient had a PCN reaction occurring within the last 10 years: No If all of the above answers are "NO", then may proceed with Cephalosporin use.   . Sulfa Antibiotics   . Tequin [Gatifloxacin]     Social History:   Social History   Socioeconomic History  . Marital status: Widowed    Spouse name: Not on file  . Number of children: Not on file  . Years of education: Not on file  . Highest education level: Not on file  Occupational History  . Occupation: Retired  Engineer, production  . Financial resource strain: Not on file  . Food insecurity:    Worry: Not on file    Inability: Not on file  . Transportation needs:    Medical: Not on file    Non-medical: Not on file  Tobacco Use  . Smoking status: Never Smoker  . Smokeless tobacco: Never Used  Substance and Sexual Activity  . Alcohol use: Not on file    Comment: occ  . Drug use: Never  . Sexual activity: Not on file  Lifestyle  . Physical activity:    Days per week: Not on file    Minutes per session: Not on file  . Stress: Not on file  Relationships  . Social connections:    Talks on phone: Not on file    Gets together: Not on file    Attends religious service: Not on file    Active member of club or organization: Not on file    Attends meetings of clubs or organizations: Not on file    Relationship status: Not on file  . Intimate partner violence:    Fear of current or ex partner: Not on file    Emotionally abused: Not on file    Physically abused: Not on file    Forced sexual activity: Not on file  Other Topics Concern  . Not on file  Social History Narrative   Patient moved to  [place] from [place] in August 2019, to be near family    Family History:   No family history on file. Family Status:  Family Status  Relation Name Status  . Mother  Deceased  . Father  Deceased    ROS:  Please see the history of present illness.  All other ROS reviewed and negative.     Physical Exam/Data:   Vitals:   06/06/18 1800 06/06/18 1913 06/06/18  2209 06/07/18 0459  BP: 129/74 129/62 (!) 120/53 (!) 118/47  Pulse:  83 72 70  Resp: 16 18 18 18   Temp:  98.4 F (36.9 C) 98.4 F (36.9 C) 98.4 F (36.9 C)  TempSrc:  Oral Oral Oral  SpO2:  99% 97% 95%  Weight:      Height:        Intake/Output Summary (Last 24 hours) at 06/07/2018 1058 Last data filed at 06/07/2018 0600 Gross per 24 hour  Intake 1860.52 ml  Output -  Net 1860.52 ml   Filed Weights   06/06/18 1430  Weight: 59 kg   Body mass index is 22.31 kg/m.  General:  Well nourished, well developed, in no acute distress HEENT: normal Lymph: no adenopathy Neck: no JVD Endocrine:  No thryomegaly Vascular: Right carotid bruit noted; 4/4 extremity pulses 2+, without bruits  Cardiac:  normal S1, S2; RRR; soft murmur  Lungs:  clear to auscultation bilaterally, no wheezing, rhonchi or rales  Abd: soft, nontender, no hepatomegaly  Ext: no edema Musculoskeletal:  No deformities, BUE and BLE strength normal and equal Skin: warm and dry  Neuro:  CNs 2-12 intact, no focal abnormalities noted Psych:  Normal affect   EKG:  The EKG was personally reviewed and demonstrates: 10/4 ECG is sinus rhythm, heart rate 70, left bundle Yajaira Doffing block seen 10/3 but chronicity unknown Telemetry:  Telemetry was personally reviewed and demonstrates: Sinus rhythm  Relevant CV Studies:  ECHO: Ordered  Laboratory Data:  Chemistry Recent Labs  Lab 06/06/18 1541 06/07/18 0129  NA 138 138  K 3.8 3.6  CL 110 112*  CO2 21* 18*  GLUCOSE 117* 107*  BUN 19 14  CREATININE 1.24* 0.98  CALCIUM 8.4* 8.1*  GFRNONAA 39*  52*  GFRAA 45* 60*  ANIONGAP 7 8    Lab Results  Component Value Date   ALT 16 06/06/2018   AST 23 06/06/2018   ALKPHOS 58 06/06/2018   BILITOT 0.4 06/06/2018   Hematology Recent Labs  Lab 06/06/18 1541 06/07/18 0129  WBC 12.7* 8.7  RBC 3.70* 3.50*  HGB 10.4* 9.8*  HCT 32.1* 30.2*  MCV 86.8 86.3  MCH 28.1 28.0  MCHC 32.4 32.5  RDW 14.5 14.5  PLT 306 310   Cardiac Enzymes Recent Labs  Lab 06/06/18 1541 06/06/18 1956 06/07/18 0129 06/07/18 0805  TROPONINI 0.05* <0.03 <0.03 <0.03      Radiology/Studies:  Dg Chest 2 View  Result Date: 06/06/2018 CLINICAL DATA:  Syncope. Fall. History of COPD. Initial encounter. EXAM: CHEST - 2 VIEW COMPARISON:  None. FINDINGS: The cardiac silhouette is borderline enlarged. The lungs are slightly hyperinflated with peribronchial thickening and interstitial coarsening. Mild scarring is noted in the right mid lung and left lung base. No sizable pleural effusion or pneumothorax is identified. No acute osseous abnormality is identified. IMPRESSION: Chronic bronchitic changes/COPD without evidence of acute abnormality. Electronically Signed   By: Sebastian Ache M.D.   On: 06/06/2018 16:05   Ct Head Wo Contrast  Result Date: 06/06/2018 CLINICAL DATA:  Dizziness, fall. EXAM: CT HEAD WITHOUT CONTRAST CT CERVICAL SPINE WITHOUT CONTRAST TECHNIQUE: Multidetector CT imaging of the head and cervical spine was performed following the standard protocol without intravenous contrast. Multiplanar CT image reconstructions of the cervical spine were also generated. COMPARISON:  None. FINDINGS: CT HEAD FINDINGS Brain: Mild diffuse cortical atrophy is noted. Mild chronic ischemic white matter disease is noted. No mass effect or midline shift is noted. Ventricular size is within normal  limits. There is no evidence of mass lesion, hemorrhage or acute infarction. Vascular: No hyperdense vessel or unexpected calcification. Skull: Status post left temporal craniotomy. No  acute abnormality is noted. Sinuses/Orbits: No acute finding. Other: None. CT CERVICAL SPINE FINDINGS Alignment: Normal. Skull base and vertebrae: No acute fracture. No primary bone lesion or focal pathologic process. Soft tissues and spinal canal: No prevertebral fluid or swelling. No visible canal hematoma. Disc levels: Moderate degenerative disc disease is noted at C4-5 and C5-6. Upper chest: Negative. Other: Degenerative changes are seen involving the left-sided posterior facet joints. IMPRESSION: Mild diffuse cortical atrophy. Mild chronic ischemic white matter disease. No acute intracranial abnormality seen. Moderate multilevel degenerative disc disease. No acute abnormality seen in the cervical spine Electronically Signed   By: Lupita Raider, M.D.   On: 06/06/2018 16:53   Ct Cervical Spine Wo Contrast  Result Date: 06/06/2018 CLINICAL DATA:  Dizziness, fall. EXAM: CT HEAD WITHOUT CONTRAST CT CERVICAL SPINE WITHOUT CONTRAST TECHNIQUE: Multidetector CT imaging of the head and cervical spine was performed following the standard protocol without intravenous contrast. Multiplanar CT image reconstructions of the cervical spine were also generated. COMPARISON:  None. FINDINGS: CT HEAD FINDINGS Brain: Mild diffuse cortical atrophy is noted. Mild chronic ischemic white matter disease is noted. No mass effect or midline shift is noted. Ventricular size is within normal limits. There is no evidence of mass lesion, hemorrhage or acute infarction. Vascular: No hyperdense vessel or unexpected calcification. Skull: Status post left temporal craniotomy. No acute abnormality is noted. Sinuses/Orbits: No acute finding. Other: None. CT CERVICAL SPINE FINDINGS Alignment: Normal. Skull base and vertebrae: No acute fracture. No primary bone lesion or focal pathologic process. Soft tissues and spinal canal: No prevertebral fluid or swelling. No visible canal hematoma. Disc levels: Moderate degenerative disc disease is noted  at C4-5 and C5-6. Upper chest: Negative. Other: Degenerative changes are seen involving the left-sided posterior facet joints. IMPRESSION: Mild diffuse cortical atrophy. Mild chronic ischemic white matter disease. No acute intracranial abnormality seen. Moderate multilevel degenerative disc disease. No acute abnormality seen in the cervical spine Electronically Signed   By: Lupita Raider, M.D.   On: 06/06/2018 16:53    Assessment and Plan:   1.  Elevated troponin: -Agree with checking an echocardiogram - If there are any abnormalities, can get records from her previous cardiologist in [facility]. - No reported ischemic symptoms and no significant volume overload by exam -Ambulate and check sats with ambulation. - Her dyspnea on exertion may be simply due to deconditioning  Otherwise, per IM Principal Problem:   UTI (urinary tract infection) Active Problems:   Fall   Dehydration   Hypotension   Elevated troponin   Degenerative disc disease, cervical   Normocytic anemia   AKI (acute kidney injury) (HCC)   GERD (gastroesophageal reflux disease)   Atrial flutter (HCC)   Anxiety   For questions or updates, please contact [facility] Please consult [email or web address] for contact info under Cardiology/STEMI.   SignedTheodore Demark, PA-C  06/07/2018 10:58 AM  Attending note Patient seen and discussed with PA Barrett, I agree with her documentation above. History of aflutter, chronic UTIs admitted with fall and generalized weakness. From notes patient was hypotensive by EMS evaluation SBP in 70s. Being treated for UTI. Cardiology is consulted for LBBB unknown chronicity and elevated troponin. She denies any significant chest pain, chronic DOE unchanged.   WBC 12.7 Hgb 10.4 Plt 306 K 3.8 Cr 1.24  Trop 0.05-->neg-->neg-->neg EKG  SR LBBB CXR no acute process, +COPD CT head no acute process  Cr 1.24 to 0.98 with IVFs supports she was dry on presentation, likely etiology of her  hypotension and fall. Mild trop 0.05 in setting of UTI and hypotension not consistent with ACS. Based on trops her LBBB is clearly not acute. Follow echo, in absence of significant findings would not plan for further inpatient cardiac testing. Would need outpatient cardiology follow up  Her prior cardiologist was Dr Alberteen Spindle at [facility] [phone]. Will have our clinic request records  Dina Rich MD

## 2018-06-08 LAB — URINE CULTURE: Culture: NO GROWTH

## 2018-06-18 ENCOUNTER — Encounter: Payer: Self-pay | Admitting: *Deleted

## 2018-06-27 DIAGNOSIS — M9901 Segmental and somatic dysfunction of cervical region: Secondary | ICD-10-CM | POA: Diagnosis not present

## 2018-06-27 DIAGNOSIS — M25512 Pain in left shoulder: Secondary | ICD-10-CM | POA: Diagnosis not present

## 2018-06-27 DIAGNOSIS — M9902 Segmental and somatic dysfunction of thoracic region: Secondary | ICD-10-CM | POA: Diagnosis not present

## 2018-06-27 DIAGNOSIS — M542 Cervicalgia: Secondary | ICD-10-CM | POA: Diagnosis not present

## 2018-06-30 ENCOUNTER — Emergency Department (HOSPITAL_COMMUNITY)
Admission: EM | Admit: 2018-06-30 | Discharge: 2018-06-30 | Disposition: A | Discharge status: Home / Self Care | Payer: Medicare Other | Attending: Emergency Medicine | Admitting: Emergency Medicine

## 2018-06-30 ENCOUNTER — Emergency Department (HOSPITAL_COMMUNITY): Payer: Medicare Other

## 2018-06-30 ENCOUNTER — Encounter (HOSPITAL_COMMUNITY): Payer: Self-pay | Admitting: Emergency Medicine

## 2018-06-30 DIAGNOSIS — Z91040 Latex allergy status: Secondary | ICD-10-CM | POA: Insufficient documentation

## 2018-06-30 DIAGNOSIS — Y998 Other external cause status: Secondary | ICD-10-CM | POA: Insufficient documentation

## 2018-06-30 DIAGNOSIS — N3000 Acute cystitis without hematuria: Secondary | ICD-10-CM | POA: Diagnosis not present

## 2018-06-30 DIAGNOSIS — Z7901 Long term (current) use of anticoagulants: Secondary | ICD-10-CM | POA: Insufficient documentation

## 2018-06-30 DIAGNOSIS — M25512 Pain in left shoulder: Secondary | ICD-10-CM | POA: Diagnosis not present

## 2018-06-30 DIAGNOSIS — R410 Disorientation, unspecified: Secondary | ICD-10-CM | POA: Diagnosis not present

## 2018-06-30 DIAGNOSIS — W01198A Fall on same level from slipping, tripping and stumbling with subsequent striking against other object, initial encounter: Secondary | ICD-10-CM | POA: Diagnosis not present

## 2018-06-30 DIAGNOSIS — W19XXXA Unspecified fall, initial encounter: Secondary | ICD-10-CM

## 2018-06-30 DIAGNOSIS — Y9301 Activity, walking, marching and hiking: Secondary | ICD-10-CM | POA: Diagnosis not present

## 2018-06-30 DIAGNOSIS — Y9289 Other specified places as the place of occurrence of the external cause: Secondary | ICD-10-CM | POA: Insufficient documentation

## 2018-06-30 DIAGNOSIS — S0990XA Unspecified injury of head, initial encounter: Secondary | ICD-10-CM | POA: Diagnosis not present

## 2018-06-30 DIAGNOSIS — S4992XA Unspecified injury of left shoulder and upper arm, initial encounter: Secondary | ICD-10-CM | POA: Diagnosis not present

## 2018-06-30 DIAGNOSIS — Z79899 Other long term (current) drug therapy: Secondary | ICD-10-CM | POA: Diagnosis not present

## 2018-06-30 DIAGNOSIS — R51 Headache: Secondary | ICD-10-CM | POA: Diagnosis present

## 2018-06-30 LAB — CBC WITH DIFFERENTIAL/PLATELET
ABS IMMATURE GRANULOCYTES: 0.03 10*3/uL (ref 0.00–0.07)
BASOS PCT: 1 %
Basophils Absolute: 0.1 10*3/uL (ref 0.0–0.1)
Eosinophils Absolute: 0.1 10*3/uL (ref 0.0–0.5)
Eosinophils Relative: 1 %
HCT: 34.6 % — ABNORMAL LOW (ref 36.0–46.0)
Hemoglobin: 10.8 g/dL — ABNORMAL LOW (ref 12.0–15.0)
IMMATURE GRANULOCYTES: 0 %
Lymphocytes Relative: 24 %
Lymphs Abs: 2.9 10*3/uL (ref 0.7–4.0)
MCH: 27.1 pg (ref 26.0–34.0)
MCHC: 31.2 g/dL (ref 30.0–36.0)
MCV: 86.9 fL (ref 80.0–100.0)
Monocytes Absolute: 1.3 10*3/uL — ABNORMAL HIGH (ref 0.1–1.0)
Monocytes Relative: 11 %
NEUTROS ABS: 7.7 10*3/uL (ref 1.7–7.7)
NEUTROS PCT: 63 %
PLATELETS: 380 10*3/uL (ref 150–400)
RBC: 3.98 MIL/uL (ref 3.87–5.11)
RDW: 15.5 % (ref 11.5–15.5)
WBC: 12.1 10*3/uL — ABNORMAL HIGH (ref 4.0–10.5)
nRBC: 0 % (ref 0.0–0.2)

## 2018-06-30 LAB — URINALYSIS, ROUTINE W REFLEX MICROSCOPIC
Bilirubin Urine: NEGATIVE
GLUCOSE, UA: NEGATIVE mg/dL
Ketones, ur: NEGATIVE mg/dL
Nitrite: NEGATIVE
PH: 5 (ref 5.0–8.0)
PROTEIN: 100 mg/dL — AB
SPECIFIC GRAVITY, URINE: 1.021 (ref 1.005–1.030)

## 2018-06-30 LAB — COMPREHENSIVE METABOLIC PANEL
ALT: 12 U/L (ref 0–44)
AST: 12 U/L — AB (ref 15–41)
Albumin: 3.6 g/dL (ref 3.5–5.0)
Alkaline Phosphatase: 66 U/L (ref 38–126)
Anion gap: 8 (ref 5–15)
BUN: 20 mg/dL (ref 8–23)
CHLORIDE: 108 mmol/L (ref 98–111)
CO2: 19 mmol/L — AB (ref 22–32)
CREATININE: 1.07 mg/dL — AB (ref 0.44–1.00)
Calcium: 9.1 mg/dL (ref 8.9–10.3)
GFR calc Af Amer: 54 mL/min — ABNORMAL LOW (ref >60)
GFR, EST NON AFRICAN AMERICAN: 46 mL/min — AB (ref >60)
GLUCOSE: 112 mg/dL — AB (ref 70–99)
Potassium: 3.8 mmol/L (ref 3.5–5.1)
Sodium: 135 mmol/L (ref 135–145)
Total Bilirubin: 0.5 mg/dL (ref 0.3–1.2)
Total Protein: 7.4 g/dL (ref 6.5–8.1)

## 2018-06-30 MED ORDER — CEPHALEXIN 250 MG PO CAPS
250.0000 mg | ORAL_CAPSULE | Freq: Once | ORAL | Status: DC
  Filled 2018-06-30 (×2): qty 1

## 2018-06-30 MED ORDER — CEPHALEXIN 250 MG PO CAPS: 250.0000 mg | ORAL_CAPSULE | Freq: Two times a day (BID) | ORAL | 0 refills | Status: AC

## 2018-06-30 NOTE — Discharge Instructions (Addendum)
Follow-up with your chiropractor as planned, consider seeing an orthopedic doctor regarding your persistent shoulder pain, take the antibiotics for the urinary tract infection, follow-up with your doctor to make sure the infection resolves

## 2018-06-30 NOTE — ED Provider Notes (Signed)
[facility] EMERGENCY DEPARTMENT Provider Note   CSN: [id number] Arrival date & time: 06/30/18  1531     History   Chief Complaint Chief Complaint  Patient presents with  . Fall    HPI Diana Mata is a 84 y.o. female.  HPI Patient presents to the emergency room for evaluation of headache and shoulder pain.  Patient has a history of a recent hospitalization earlier this month on October.  Patient was discharged the following day.  Since that time the patient has been having some trouble with her left shoulder.  She has seen a chiropractor and has been receiving treatment.  She unfortunately continues to have pain in her left shoulder.  Yesterday however the patient slipped when she was walking outside and she hit the back of her head on a [place] door.  Daughter is concerned because the patient had some intermittent episodes of confusion.  For example when she was in the car where she looked down at the floor and indicated that she was looking for a pie.  Patient has not had any trouble with fevers or chills.  He has not had any nausea or vomiting.  No chest pain or shortness of breath Past Medical History:  Diagnosis Date  . Atrial flutter (HCC)   . Chronic UTI   . COPD (chronic obstructive pulmonary disease) (HCC)   . GERD (gastroesophageal reflux disease)   . IBS (irritable bowel syndrome)   . Renal disorder    renal insufficiency    Patient Active Problem List   Diagnosis Date Noted  . Fall 06/06/2018  . Hypotension 06/06/2018  . UTI (urinary tract infection) 06/06/2018  . Elevated troponin 06/06/2018  . Degenerative disc disease, cervical 06/06/2018  . Normocytic anemia 06/06/2018  . AKI (acute kidney injury) (HCC) 06/06/2018  . GERD (gastroesophageal reflux disease) 06/06/2018  . Atrial flutter (HCC) 06/06/2018  . Anxiety 06/06/2018  . Dehydration     Past Surgical History:  Procedure Laterality Date  . ABDOMINAL HYSTERECTOMY    . cyst removed from brain     . JOINT REPLACEMENT       OB History   None      Home Medications    Prior to Admission medications   Medication Sig Start Date End Date Taking? Authorizing Provider  albuterol (PROVENTIL HFA;VENTOLIN HFA) 108 (90 Base) MCG/ACT inhaler Inhale 1-2 puffs into the lungs every 6 (six) hours as needed for wheezing or shortness of breath.   Yes [provider]  dexlansoprazole (DEXILANT) 60 MG capsule Take 60 mg by mouth daily.   Yes [provider]  diazepam (VALIUM) 5 MG tablet Take 5-10 mg by mouth every 6 (six) hours as needed for anxiety.   Yes [provider]  diltiazem (CARDIZEM) 30 MG tablet Take 30 mg by mouth every morning.    Yes [provider]  escitalopram (LEXAPRO) 20 MG tablet Take 20 mg by mouth every morning.    Yes [provider]  feeding supplement, ENSURE ENLIVE, (ENSURE ENLIVE) LIQD Take 237 mLs by mouth daily. 06/08/18 07/08/18 Yes Mikhail, Nita Sells, DO  guaiFENesin (MUCINEX) 600 MG 12 hr tablet Take 600 mg by mouth 2 (two) times daily.    Yes [provider]  HYDROcodone-acetaminophen (NORCO/VICODIN) 5-325 MG tablet Take 1 tablet by mouth 2 (two) times daily.   Yes [provider]  loperamide (IMODIUM) 2 MG capsule Take 2 mg by mouth as needed for diarrhea or loose stools.   Yes [provider]  Rivaroxaban (XARELTO) 15 MG TABS tablet Take 15 mg by mouth every morning.    Yes [provider]  traZODone (DESYREL) 50 MG tablet Take 50 mg by mouth at bedtime.   Yes [provider]  cephALEXin (KEFLEX) 250 MG capsule Take 1 capsule (250 mg total) by mouth 2 (two) times daily for 7 days. 06/30/18 07/07/18  Linwood Dibbles, MD    Family History History reviewed. No pertinent family history.  Social History Social History   Tobacco Use  . Smoking status: Never Smoker  . Smokeless tobacco: Never Used  Substance Use Topics  . Alcohol use: Never    Frequency: Never    Comment: occ  .  Drug use: Never     Allergies   Levaquin [levofloxacin in d5w]; Penicillins; Sulfa antibiotics; Tequin [gatifloxacin]; and Latex   Review of Systems Review of Systems  All other systems reviewed and are negative.    Physical Exam Updated Vital Signs BP 118/64   Pulse 78   Temp 98 F (36.7 C) (Oral)   Ht 1.626 m (5\' 4" )   Wt 59.9 kg   SpO2 95%   BMI 22.66 kg/m   Physical Exam  Constitutional: She is oriented to person, place, and time. She appears well-developed and well-nourished. No distress.  HENT:  Head: Normocephalic and atraumatic.  Right Ear: External ear normal.  Left Ear: External ear normal.  Mouth/Throat: Oropharynx is clear and moist.  Eyes: Conjunctivae are normal. Right eye exhibits no discharge. Left eye exhibits no discharge. No scleral icterus.  Neck: Neck supple. No tracheal deviation present.  Cardiovascular: Normal rate, regular rhythm and intact distal pulses.  Pulmonary/Chest: Effort normal and breath sounds normal. No stridor. No respiratory distress. She has no wheezes. She has no rales.  Abdominal: Soft. Bowel sounds are normal. She exhibits no distension. There is no tenderness. There is no rebound and no guarding.  Musculoskeletal: She exhibits no edema.       Right shoulder: She exhibits no tenderness, no bony tenderness and no swelling.       Left shoulder: She exhibits decreased range of motion, tenderness and deformity. She exhibits no bony tenderness and no swelling.       Right wrist: She exhibits no tenderness, no bony tenderness and no swelling.       Left wrist: She exhibits no tenderness, no bony tenderness and no swelling.       Right hip: She exhibits normal range of motion, no tenderness, no bony tenderness and no swelling.       Left hip: She exhibits normal range of motion, no tenderness and no bony tenderness.       Right ankle: She exhibits no swelling. No tenderness.       Left ankle: She exhibits no swelling. No tenderness.        Cervical back: She exhibits no tenderness, no bony tenderness and no swelling.       Thoracic back: She exhibits no tenderness, no bony tenderness and no swelling.       Lumbar back: She exhibits no tenderness, no bony tenderness and no swelling.  Neurological: She is alert and oriented to person, place, and time. She has normal strength. No cranial nerve deficit (No facial droop, extraocular movements intact, tongue midline ) or sensory deficit. She exhibits normal muscle tone. She displays no seizure activity. Coordination normal. GCS eye subscore is 4. GCS verbal subscore is 5. GCS motor subscore is 6.  5 out  of 5 grip strength bilateral upper extremity, able to hold both legs off bed for 5 seconds, sensation intact in all extremities, no visual field cuts, no left or right sided neglect,  no nystagmus noted   Skin: Skin is warm and dry. No rash noted.  Psychiatric: She has a normal mood and affect.  Nursing note and vitals reviewed.    ED Treatments / Results  Labs (all labs ordered are listed, but only abnormal results are displayed) Labs Reviewed  URINALYSIS, ROUTINE W REFLEX MICROSCOPIC - Abnormal; Notable for the following components:      Result Value   APPearance TURBID (*)    Hgb urine dipstick MODERATE (*)    Protein, ur 100 (*)    Leukocytes, UA MODERATE (*)    WBC, UA >50 (*)    Bacteria, UA FEW (*)    Non Squamous Epithelial 6-10 (*)    All other components within normal limits  CBC WITH DIFFERENTIAL/PLATELET - Abnormal; Notable for the following components:   WBC 12.1 (*)    Hemoglobin 10.8 (*)    HCT 34.6 (*)    Monocytes Absolute 1.3 (*)    All other components within normal limits  COMPREHENSIVE METABOLIC PANEL - Abnormal; Notable for the following components:   CO2 19 (*)    Glucose, Bld 112 (*)    Creatinine, Ser 1.07 (*)    AST 12 (*)    GFR calc non Af Amer 46 (*)    GFR calc Af Amer 54 (*)    All other components within normal limits  URINE CULTURE      EKG None  Radiology Ct Head Wo Contrast  Result Date: 06/30/2018 CLINICAL DATA:  Per ED notes: PT states she slipped and fell and hit the back of her head on the french doors at home yesterday evening. Daughter reports some confusion started today and pt is on Xarelto. PT also c/o pain with ROM and decreased ROM to left shoulder. EXAM: CT HEAD WITHOUT CONTRAST TECHNIQUE: Contiguous axial images were obtained from the base of the skull through the vertex without intravenous contrast. COMPARISON:  CT 06/06/2018 FINDINGS: Brain: There is moderate central and cortical atrophy. Periventricular white matter changes are consistent with small vessel disease. There is no intra or extra-axial fluid collection or mass lesion. The basilar cisterns and ventricles have a normal appearance. There is no CT evidence for acute infarction or hemorrhage. Vascular: No hyperdense vessel or unexpected calcification. Skull: There is mildly heterogeneous appearance of the sphenoid bone and asymmetry of the sphenoid air cells, consistent with focal fibrous dysplasia. Status post LEFT temporal craniotomy. Sinuses/Orbits: No acute finding. Other: None IMPRESSION: 1. Atrophy and small vessel disease. 2.  No evidence for acute intracranial abnormality. 3. 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 Electronically Signed   By: Norva Pavlov M.D.   On: 06/30/2018 17:05   Dg Shoulder Left  Result Date: 06/30/2018 CLINICAL DATA:  Pt fell yesterday hitting her head and left shoulder, severe left shoulder pain, did images in wheelchair EXAM: LEFT SHOULDER - 2+ VIEW COMPARISON:  None. FINDINGS: No fracture or dislocation. Arthropathic changes of the glenohumeral joint with mild narrowing of the joint space with small marginal spurs from the inferior humeral head. AC joint normally spaced and aligned with no degenerative change. Bones are demineralized. Soft tissues are unremarkable. IMPRESSION: 1. No  fracture or dislocation. Electronically Signed   By: Amie Portland M.D.   On: 06/30/2018 16:51  Procedures Procedures (including critical care time)  Medications Ordered in ED Medications  cephALEXin (KEFLEX) capsule 250 mg (has no administration in time range)     Initial Impression / Assessment and Plan / ED Course  I have reviewed the triage vital signs and the nursing notes.  Pertinent labs & imaging results that were available during my care of the patient were reviewed by me and considered in my medical decision making (see chart for details).  Clinical Course as of Jul 01 2011  Wynelle Link Jun 30, 2018  1947 Metabolic panel stable.  No significant change compared to previous  Comprehensive metabolic panel(!) [JK]  1947 Anemia stable.  Slight increase in white count  CBC with Differential(!) [JK]  1947 No acute findings noted on head CT your shoulder x-ray   [JK]    Clinical Course User Index [JK] Linwood Dibbles, MD    Pt presented to the ED for evaluation of a recent fall.  Xrays and CT without acute findings.  Labs notable for possible uTI.  Could be causing her confusion.  Will dc home on oral medications.  Follow up with PCP  Final Clinical Impressions(s) / ED Diagnoses   Final diagnoses:  Acute cystitis without hematuria  Fall, initial encounter    ED Discharge Orders         Ordered    cephALEXin (KEFLEX) 250 MG capsule  2 times daily     06/30/18 2004           Linwood Dibbles, MD 06/30/18 2017

## 2018-06-30 NOTE — ED Triage Notes (Addendum)
PT states she slipped and fell and hit the back of her head on the french doors at home yesterday evening. Daughter reports some confusion started today and pt is on Xarelto. PT also c/o pain with ROM and decreased ROM to left shoulder.

## 2018-07-01 DIAGNOSIS — M542 Cervicalgia: Secondary | ICD-10-CM | POA: Diagnosis not present

## 2018-07-01 DIAGNOSIS — M9901 Segmental and somatic dysfunction of cervical region: Secondary | ICD-10-CM | POA: Diagnosis not present

## 2018-07-01 DIAGNOSIS — M9902 Segmental and somatic dysfunction of thoracic region: Secondary | ICD-10-CM | POA: Diagnosis not present

## 2018-07-01 DIAGNOSIS — M25512 Pain in left shoulder: Secondary | ICD-10-CM | POA: Diagnosis not present

## 2018-07-03 LAB — URINE CULTURE

## 2018-07-04 ENCOUNTER — Telehealth: Payer: Self-pay | Admitting: *Deleted

## 2018-07-04 NOTE — Telephone Encounter (Signed)
Post ED Visit - Positive Culture Follow-up: Unsuccessful Patient Follow-up  Culture assessed and recommendations reviewed by:  []  Enzo Bi, Pharm.D. []  Celedonio Miyamoto, Pharm.D., BCPS AQ-ID []  Garvin Fila, Pharm.D., BCPS []  Georgina Pillion, Pharm.D., BCPS []  [place], [address].D., BCPS, AAHIVP []  Estella Husk, Pharm.D., BCPS, AAHIVP []  Sherlynn Carbon, PharmD []  Pollyann Samples, PharmD, BCPS  Positive  Urine culture  []  Patient discharged without antimicrobial prescription and treatment is now indicated [x]  Organism is resistant to prescribed ED discharge antimicrobial []  Patient with positive blood cultures  Plan: D/C Keflex, Fosfomycin 3gms PO x 1  Unable to contact patient after 3 attempts, letter will be sent to address on file  Lysle Pearl 07/04/2018, 10:02 AM

## 2018-07-04 NOTE — Progress Notes (Signed)
ED Antimicrobial Stewardship Positive Culture Follow Up   Diana Mata is an 84 y.o. female who presented to [facility] on 06/30/2018 with a chief complaint of  Chief Complaint  Patient presents with  . Fall    Recent Results (from the past 720 hour(s))  Urine Culture     Status: None   Collection Time: 06/06/18  2:57 PM  Result Value Ref Range Status   Specimen Description   Final    URINE, RANDOM Performed at [facility], [address], [place], [place] [address]    Special Requests   Final    NONE Performed at [facility], [address], [place], [place] [address]    Culture   Final    NO GROWTH Performed at [facility], [address], [place], [place] [address]    Report Status 06/08/2018 FINAL  Final  Urine Culture     Status: Abnormal   Collection Time: 06/30/18  7:24 PM  Result Value Ref Range Status   Specimen Description   Final    URINE, RANDOM Performed at [facility], [address], [place], [place] [address]    Special Requests   Final    NONE Performed at [facility], [address] [place], [place] [address]    Culture (A)  Final    >=100,000 COLONIES/mL KLEBSIELLA PNEUMONIAE Confirmed Extended Spectrum Beta-Lactamase Producer (ESBL).  In bloodstream infections from ESBL organisms, carbapenems are preferred over piperacillin/tazobactam. They are shown to have a lower risk of mortality.    Report Status 07/03/2018 FINAL  Final   Organism ID, Bacteria KLEBSIELLA PNEUMONIAE (A)  Final      Susceptibility   Klebsiella pneumoniae - MIC*    AMPICILLIN >=32 RESISTANT Resistant     CEFAZOLIN >=64 RESISTANT Resistant     CEFTRIAXONE >=64 RESISTANT Resistant     CIPROFLOXACIN 2 INTERMEDIATE Intermediate     GENTAMICIN >=16 RESISTANT Resistant     IMIPENEM 0.5 SENSITIVE Sensitive     NITROFURANTOIN 256 RESISTANT Resistant     TRIMETH/SULFA >=320 RESISTANT Resistant     AMPICILLIN/SULBACTAM >=32 RESISTANT Resistant     PIP/TAZO 32  INTERMEDIATE Intermediate     Extended ESBL POSITIVE Resistant     * >=100,000 COLONIES/mL KLEBSIELLA PNEUMONIAE    [x]  Treated with Keflex, organism resistant to prescribed antimicrobial  New antibiotic prescription: Discontinue the Keflex, take Fosfomycin 3 grams po x 1  ED Provider: Terance Hart, [facility]   Tera Mater 07/04/2018, 9:29 AM Clinical Pharmacist Monday - Friday phone -  [phone] Saturday - Sunday phone - [phone]

## 2018-07-05 DIAGNOSIS — M25511 Pain in right shoulder: Secondary | ICD-10-CM | POA: Diagnosis not present

## 2018-07-11 ENCOUNTER — Telehealth: Payer: Self-pay | Admitting: *Deleted

## 2018-07-11 NOTE — Telephone Encounter (Signed)
Spoke with patient's daughter who stated patient is feeling some better but urine still has foul odor.  Fosfomycin 3gms PO x 1 dose called to [facility], [phone].  Daughter verbalized understanding.

## 2018-07-11 NOTE — ED Notes (Signed)
Spoke with daughter and advised medication FOSFOMYCIN 3 GMS  Is a good choice per Dr Jeraldine Loots, DAUGHTER IS WILLING TO PURCHASE MED SINCE MOTHER DOES NOT HAVE A FAMILY DOCTOR AT PRESENT

## 2018-07-19 DIAGNOSIS — I483 Typical atrial flutter: Secondary | ICD-10-CM | POA: Diagnosis not present

## 2018-07-19 DIAGNOSIS — G4700 Insomnia, unspecified: Secondary | ICD-10-CM | POA: Diagnosis not present

## 2018-07-19 DIAGNOSIS — J449 Chronic obstructive pulmonary disease, unspecified: Secondary | ICD-10-CM | POA: Diagnosis not present

## 2018-07-19 DIAGNOSIS — N3281 Overactive bladder: Secondary | ICD-10-CM | POA: Diagnosis not present

## 2018-07-19 DIAGNOSIS — F331 Major depressive disorder, recurrent, moderate: Secondary | ICD-10-CM | POA: Diagnosis not present

## 2018-07-19 DIAGNOSIS — F419 Anxiety disorder, unspecified: Secondary | ICD-10-CM | POA: Diagnosis not present

## 2018-07-19 DIAGNOSIS — N390 Urinary tract infection, site not specified: Secondary | ICD-10-CM | POA: Diagnosis not present

## 2018-08-07 ENCOUNTER — Ambulatory Visit: Payer: Medicare Other | Admitting: Student

## 2018-09-12 ENCOUNTER — Ambulatory Visit (INDEPENDENT_AMBULATORY_CARE_PROVIDER_SITE_OTHER): Payer: Medicare Other | Admitting: Student

## 2018-09-12 ENCOUNTER — Encounter: Payer: Self-pay | Admitting: Student

## 2018-09-12 VITALS — BP 106/66 | HR 84 | Ht 63.5 in | Wt 133.0 lb

## 2018-09-12 DIAGNOSIS — I351 Nonrheumatic aortic (valve) insufficiency: Secondary | ICD-10-CM

## 2018-09-12 DIAGNOSIS — Z7901 Long term (current) use of anticoagulants: Secondary | ICD-10-CM

## 2018-09-12 DIAGNOSIS — K219 Gastro-esophageal reflux disease without esophagitis: Secondary | ICD-10-CM

## 2018-09-12 DIAGNOSIS — I4892 Unspecified atrial flutter: Secondary | ICD-10-CM

## 2018-09-12 DIAGNOSIS — I447 Left bundle-branch block, unspecified: Secondary | ICD-10-CM | POA: Diagnosis not present

## 2018-09-12 MED ORDER — DILTIAZEM HCL 30 MG PO TABS: 30.0000 mg | ORAL_TABLET | Freq: Two times a day (BID) | ORAL | 11 refills | Status: DC

## 2018-09-12 MED ORDER — RIVAROXABAN 15 MG PO TABS: 15.0000 mg | ORAL_TABLET | Freq: Every morning | ORAL | 11 refills | Status: DC

## 2018-09-12 NOTE — Patient Instructions (Signed)
Medication Instructions:  INCREASE CARDIZEM TO 30 MG - TWO TIMES DAILY   Labwork: NONE  Testing/Procedures: NONE  Follow-Up: Your physician wants you to follow-up in: 6 MONTHS WITH DR. BRANCH.  You will receive a reminder letter in the mail two months in advance. If you don't receive a letter, please call our office to schedule the follow-up appointment.   Any Other Special Instructions Will Be Listed Below (If Applicable).     If you need a refill on your cardiac medications before your next appointment, please call your pharmacy.

## 2018-09-12 NOTE — Progress Notes (Signed)
Cardiology Office Note    Date:  09/12/2018   ID:  Diana Mata, DOB [birth year], MRN [id number]  PCP:  System, Pcp Not In --> Plans to establish with Dr. Margo AyeHall  Cardiologist: Dina RichBranch, Jonathan, MD    Chief Complaint  Patient presents with  . Hospitalization Follow-up    History of Present Illness:    Diana Mata is a 85 y.o. female with past medical history of paroxysmal atrial flutter, HTN, IBS, COPD, and Stage 2-3 CKD who presents to the office today for hospital follow-up.  She was most recently admitted to [facility] after suffering a fall. Cardiology was consulted during admission as she was found to have a left bundle branch block on her EKG of unknown chronicity and initial troponin elevated to 0.05. It was thought that she was dehydrated on admission as creatinine was elevated to 1.24 and improved to 0.98 with administration of IV fluids.  Echocardiogram was obtained and showed a preserved EF of 60 to 65%, mild LVH, no regional wall motion abnormalities, and mild aortic regurgitation. No further cardiac evaluation was pursued at that time.  Records have been received from [place] [facility] and her last office visit appeared to have been in 10/2017 during which he does mention that she has a history of paroxysmal atrial flutter which was initially diagnosed in 11/2015 and she had remained on Xarelto for anticoagulation and Cardizem 30mg  BID for rate control. Also noted to have mild to moderate aortic regurgitation by prior echocardiogram imaging in 11/2015. Prior notes from 2017 also mention that she had a left bundle branch block at that time with event monitoring showing no significant bradycardia or sinus pauses.  In talking with the patient and her daughter today, she reports overall doing well from a cardiac perspective since her last office visit. She did travel to [place] during 08/2018 to visit other family members and reports  having worsening lower extremity edema on the drive back but this has now resolved.  She denies any recent chest pain, dyspnea on exertion, orthopnea, or PND. She does report having occasional palpitations at night when lying down to go to sleep.  Reports good compliance with her current medication regimen and denies missing any recent doses of Xarelto. No recent melena, hematochezia, or hematuria.   Past Medical History:  Diagnosis Date  . Atrial flutter (HCC)   . Chronic UTI   . COPD (chronic obstructive pulmonary disease) (HCC)   . GERD (gastroesophageal reflux disease)   . IBS (irritable bowel syndrome)   . Renal disorder    renal insufficiency    Past Surgical History:  Procedure Laterality Date  . ABDOMINAL HYSTERECTOMY    . 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 JOINT REPLACEMENT      Current Medications: Outpatient Medications Prior to Visit  Medication Sig Dispense Refill  . albuterol (PROVENTIL HFA;VENTOLIN HFA) 108 (90 Base) MCG/ACT inhaler Inhale 1-2 puffs into the lungs every 6 (six) hours as needed for wheezing or shortness of breath.    . diazepam (VALIUM) 5 MG tablet Take 5-10 mg by mouth every 6 (six) hours as needed for anxiety.    Marland Kitchen. escitalopram (LEXAPRO) 20 MG tablet Take 20 mg by mouth every morning.     Marland Kitchen. guaiFENesin (MUCINEX) 600 MG 12 hr tablet Take 600 mg by mouth 2 (two) times daily.     Marland Kitchen. HYDROcodone-acetaminophen (NORCO/VICODIN) 5-325 MG tablet Take 1 tablet by mouth 2 (two) times daily.    .Marland Kitchen  loperamide (IMODIUM) 2 MG capsule Take 2 mg by mouth as needed for diarrhea or loose stools.    Marland Kitchen omeprazole (PRILOSEC) 20 MG capsule Take 20 mg by mouth daily.    . traZODone (DESYREL) 50 MG tablet Take 50 mg by mouth at bedtime.    Marland Kitchen dexlansoprazole (DEXILANT) 60 MG capsule Take 60 mg by mouth daily.    Marland Kitchen diltiazem (CARDIZEM) 30 MG tablet Take 30 mg by mouth every morning.     . Rivaroxaban (XARELTO) 15 MG TABS tablet Take 15 mg by mouth every morning.      No  facility-administered medications prior to visit.      Allergies:   Levaquin [levofloxacin in d5w]; Penicillins; Sulfa antibiotics; Tequin [gatifloxacin]; and Latex   Social History   Socioeconomic History  . Marital status: Widowed    Spouse name: Not on file  . Number of children: Not on file  . Years of education: Not on file  . Highest education level: Not on file  Occupational History  . Occupation: Retired  Engineer, production  . Financial resource strain: Not on file  . Food insecurity:    Worry: Not on file    Inability: Not on file  . Transportation needs:    Medical: Not on file    Non-medical: Not on file  Tobacco Use  . Smoking status: Never Smoker  . Smokeless tobacco: Never Used  Substance and Sexual Activity  . Alcohol use: Never    Frequency: Never    Comment: occ  . Drug use: Never  . Sexual activity: Not on file  Lifestyle  . Physical activity:    Days per week: Not on file    Minutes per session: Not on file  . Stress: Not on file  Relationships  . Social connections:    Talks on phone: Not on file    Gets together: Not on file    Attends religious service: Not on file    Active member of club or organization: Not on file    Attends meetings of clubs or organizations: Not on file    Relationship status: Not on file  Other Topics Concern  . Not on file  Social History Narrative   Patient moved to [place] from [place] in August 2019, to be near family     Family History:  The patient's family history includes Hypertension in her mother.   Review of Systems:   Please see the history of present illness.     General:  No chills, fever, night sweats or weight changes.  Cardiovascular:  No chest pain, dyspnea on exertion, edema, orthopnea,  paroxysmal nocturnal dyspnea. Positive for palpitations.  Dermatological: No rash, lesions/masses Respiratory: No cough, dyspnea Urologic: No hematuria, dysuria Abdominal:   No nausea, vomiting, diarrhea,  bright red blood per rectum, melena, or hematemesis Neurologic:  No visual changes, wkns, changes in mental status.  All other systems reviewed and are otherwise negative except as noted above.   Physical Exam:    VS:  BP 106/66 (BP Location: Right Arm)   Pulse 84   Ht 5' 3.5" (1.613 m)   Wt 133 lb (60.3 kg)   SpO2 98%   BMI 23.19 kg/m    General: Well developed, well nourished Caucasian female appearing in no acute distress. Head: Normocephalic, atraumatic, sclera non-icteric, no xanthomas, nares are without discharge.  Neck: No carotid bruits. JVD not elevated.  Lungs: Respirations regular and unlabored, without wheezes or rales.  Heart:  Regular rate and rhythm. No S3 or S4.  No murmur, no rubs, or gallops appreciated. Abdomen: Soft, non-tender, non-distended with normoactive bowel sounds. No hepatomegaly. No rebound/guarding. No obvious abdominal masses. Msk:  Strength and tone appear normal for age. No joint deformities or effusions. Extremities: No clubbing or cyanosis. No lower extremity edema.  Distal pedal pulses are 2+ bilaterally. Neuro: Alert and oriented X 3. Moves all extremities spontaneously. No focal deficits noted. Psych:  Responds to questions appropriately with a normal affect. Skin: No rashes or lesions noted  Wt Readings from Last 3 Encounters:  09/12/18 133 lb (60.3 kg)  06/30/18 132 lb (59.9 kg)  06/06/18 130 lb (59 kg)     Studies/Labs Reviewed:   EKG:  EKG is not ordered today. EKG from 06/06/2018 is reviewed which shows NSR, HR 76, with PAC's and LBBB.   Recent Labs: 06/30/2018: ALT 12; BUN 20; Creatinine, Ser 1.07; Hemoglobin 10.8; Platelets 380; Potassium 3.8; Sodium 135   Lipid Panel No results found for: CHOL, TRIG, HDL, CHOLHDL, VLDL, LDLCALC, LDLDIRECT  Additional studies/ records that were reviewed today include:   Echocardiogram: 06/2018 Study Conclusions  - Left ventricle: The cavity size was normal. Wall thickness was   increased in  a pattern of mild LVH. Systolic function was normal.   The estimated ejection fraction was in the range of 60% to 65%.   Wall motion was normal; there were no regional wall motion   abnormalities. Left ventricular diastolic function parameters   were normal. - Aortic valve: Mildly calcified annulus. Trileaflet; normal   thickness leaflets. There was mild regurgitation. Valve area   (VTI): 2.38 cm^2. Valve area (Vmax): 2.1 cm^2. - Mitral valve: There was mild regurgitation. - Atrial septum: No defect or patent foramen ovale was identified. - Pulmonary arteries: PA peak pressure: 36 mm Hg (S). - Technically adequate study.  Assessment:    1. Paroxysmal atrial flutter (HCC)   2. Current use of long term anticoagulation   3. Aortic valve insufficiency, etiology of cardiac valve disease unspecified   4. LBBB (left bundle branch block)   5. Gastroesophageal reflux disease without esophagitis      Plan:   In order of problems listed above:  1. Paroxysmal Atrial Flutter/ Use of Long-Term Anticoagulation - Was in NSR during admission and maintaining NSR by examination today. She does report occasional palpitations at night and by review of her medication list she was previously on Diltiazem 30 mg twice daily but this was reduced to once daily dosing at some point over the past year. Would recommend titration of this to 30 mg twice daily. Would not start on Cardizem CD at this time given her low-normal BP (at 106/66 during today's visit). - she denies any evidence of active bleeding. Continue Xarelto 15mg  daily (creatine clearance 37 mL/min based off of most recent labs and current weight).   2. Aortic Regurgitation - This was mild by most recent echocardiogram in 06/2018 and also noted on her prior echocardiogram in 11/2015. Continue to follow.  3. LBBB - Chronic. Noted on EKG tracings during recent admission and previously noted on prior EKG's obtained by [place] Cardi[place] in [place].    4. GERD - she is listed as being on both Dexilant and Prilosec. I confirmed with the patient that she is only taking Prilosec 20 mg daily and her medication list was updated.   Medication Adjustments/Labs and Tests Ordered: Current medicines are reviewed at length with the patient today.  Concerns  regarding medicines are outlined above.  Medication changes, Labs and Tests ordered today are listed in the Patient Instructions below. Patient Instructions  Medication Instructions:  INCREASE CARDIZEM TO 30 MG - TWO TIMES DAILY   Labwork: NONE  Testing/Procedures: NONE  Follow-Up: Your physician wants you to follow-up in: 6 MONTHS WITH DR. BRANCH.  You will receive a reminder letter in the mail two months in advance. If you don't receive a letter, please call our office to schedule the follow-up appointment.  Any Other Special Instructions Will Be Listed Below (If Applicable).  If you need a refill on your cardiac medications before your next appointment, please call your pharmacy.   Signed, Ellsworth Lennox, PA-C  09/12/2018 4:22 PM    [facility] [address] [place], [place] [address] Phone: [phone] Fax: [phone]

## 2018-09-13 DIAGNOSIS — M25512 Pain in left shoulder: Secondary | ICD-10-CM | POA: Diagnosis not present

## 2018-09-13 DIAGNOSIS — M19012 Primary osteoarthritis, left shoulder: Secondary | ICD-10-CM | POA: Diagnosis not present

## 2018-09-16 DIAGNOSIS — H04123 Dry eye syndrome of bilateral lacrimal glands: Secondary | ICD-10-CM | POA: Diagnosis not present

## 2018-09-16 DIAGNOSIS — H5201 Hypermetropia, right eye: Secondary | ICD-10-CM | POA: Diagnosis not present

## 2018-09-16 DIAGNOSIS — H5212 Myopia, left eye: Secondary | ICD-10-CM | POA: Diagnosis not present

## 2018-09-16 DIAGNOSIS — H353132 Nonexudative age-related macular degeneration, bilateral, intermediate dry stage: Secondary | ICD-10-CM | POA: Diagnosis not present

## 2018-09-18 ENCOUNTER — Ambulatory Visit (INDEPENDENT_AMBULATORY_CARE_PROVIDER_SITE_OTHER): Payer: Medicare Other | Admitting: Urology

## 2018-09-18 DIAGNOSIS — R3915 Urgency of urination: Secondary | ICD-10-CM | POA: Diagnosis not present

## 2018-09-18 DIAGNOSIS — N3021 Other chronic cystitis with hematuria: Secondary | ICD-10-CM

## 2018-09-20 ENCOUNTER — Other Ambulatory Visit: Payer: Self-pay | Admitting: Urology

## 2018-09-20 ENCOUNTER — Other Ambulatory Visit (HOSPITAL_COMMUNITY): Payer: Self-pay | Admitting: Urology

## 2018-09-20 DIAGNOSIS — N3021 Other chronic cystitis with hematuria: Secondary | ICD-10-CM

## 2018-09-23 DIAGNOSIS — I483 Typical atrial flutter: Secondary | ICD-10-CM | POA: Diagnosis not present

## 2018-09-23 DIAGNOSIS — J449 Chronic obstructive pulmonary disease, unspecified: Secondary | ICD-10-CM | POA: Diagnosis not present

## 2018-09-23 DIAGNOSIS — F331 Major depressive disorder, recurrent, moderate: Secondary | ICD-10-CM | POA: Diagnosis not present

## 2018-09-23 DIAGNOSIS — F419 Anxiety disorder, unspecified: Secondary | ICD-10-CM | POA: Diagnosis not present

## 2018-09-25 DIAGNOSIS — I483 Typical atrial flutter: Secondary | ICD-10-CM | POA: Diagnosis not present

## 2018-09-25 DIAGNOSIS — F331 Major depressive disorder, recurrent, moderate: Secondary | ICD-10-CM | POA: Diagnosis not present

## 2018-09-25 DIAGNOSIS — G4700 Insomnia, unspecified: Secondary | ICD-10-CM | POA: Diagnosis not present

## 2018-09-25 DIAGNOSIS — K219 Gastro-esophageal reflux disease without esophagitis: Secondary | ICD-10-CM | POA: Diagnosis not present

## 2018-09-25 DIAGNOSIS — N3281 Overactive bladder: Secondary | ICD-10-CM | POA: Diagnosis not present

## 2018-09-25 DIAGNOSIS — F411 Generalized anxiety disorder: Secondary | ICD-10-CM | POA: Diagnosis not present

## 2018-09-25 DIAGNOSIS — J449 Chronic obstructive pulmonary disease, unspecified: Secondary | ICD-10-CM | POA: Diagnosis not present

## 2018-09-25 DIAGNOSIS — M25512 Pain in left shoulder: Secondary | ICD-10-CM | POA: Diagnosis not present

## 2018-10-01 DIAGNOSIS — M25512 Pain in left shoulder: Secondary | ICD-10-CM | POA: Diagnosis not present

## 2018-10-03 DIAGNOSIS — F419 Anxiety disorder, unspecified: Secondary | ICD-10-CM | POA: Diagnosis not present

## 2018-10-03 DIAGNOSIS — J449 Chronic obstructive pulmonary disease, unspecified: Secondary | ICD-10-CM | POA: Diagnosis not present

## 2018-10-03 DIAGNOSIS — I483 Typical atrial flutter: Secondary | ICD-10-CM | POA: Diagnosis not present

## 2018-10-03 DIAGNOSIS — F331 Major depressive disorder, recurrent, moderate: Secondary | ICD-10-CM | POA: Diagnosis not present

## 2018-10-07 ENCOUNTER — Ambulatory Visit (HOSPITAL_COMMUNITY)
Admission: RE | Admit: 2018-10-07 | Discharge: 2018-10-07 | Disposition: A | Discharge status: Home / Self Care | Payer: Medicare Other | Source: Ambulatory Visit | Attending: Urology | Admitting: Urology

## 2018-10-07 DIAGNOSIS — N3021 Other chronic cystitis with hematuria: Secondary | ICD-10-CM | POA: Insufficient documentation

## 2018-10-07 DIAGNOSIS — I483 Typical atrial flutter: Secondary | ICD-10-CM | POA: Diagnosis not present

## 2018-10-07 DIAGNOSIS — F419 Anxiety disorder, unspecified: Secondary | ICD-10-CM | POA: Diagnosis not present

## 2018-10-07 DIAGNOSIS — N200 Calculus of kidney: Secondary | ICD-10-CM | POA: Diagnosis not present

## 2018-10-07 DIAGNOSIS — J449 Chronic obstructive pulmonary disease, unspecified: Secondary | ICD-10-CM | POA: Diagnosis not present

## 2018-10-07 DIAGNOSIS — F331 Major depressive disorder, recurrent, moderate: Secondary | ICD-10-CM | POA: Diagnosis not present

## 2018-10-07 LAB — POCT I-STAT CREATININE: CREATININE: 1.1 mg/dL — AB (ref 0.44–1.00)

## 2018-10-07 MED ORDER — IOPAMIDOL (ISOVUE-370) INJECTION 76%
100.0000 mL | Freq: Once | INTRAVENOUS | Status: AC | PRN
  Administered 2018-10-07: 100 mL via INTRAVENOUS

## 2018-10-10 ENCOUNTER — Other Ambulatory Visit: Payer: Self-pay | Admitting: Orthopaedic Surgery

## 2018-10-10 DIAGNOSIS — M25511 Pain in right shoulder: Secondary | ICD-10-CM

## 2018-10-10 DIAGNOSIS — M19012 Primary osteoarthritis, left shoulder: Secondary | ICD-10-CM | POA: Diagnosis not present

## 2018-10-11 ENCOUNTER — Telehealth: Payer: Self-pay | Admitting: *Deleted

## 2018-10-11 NOTE — Telephone Encounter (Signed)
   [facility] Pre-operative Risk Assessment    Request for surgical clearance:  1. What type of surgery is being performed?  LEFT TOTAL SHOULDER REPLACEMENT   2. When is this surgery scheduled? TBD  3. What type of clearance is required (medical clearance vs. Pharmacy clearance to hold med vs. Both)? BOTH  4. Are there any medications that need to be held prior to surgery and how long? [place]  5. Practice name and name of physician performing surgery? [facility]  6. What is your office phone number [phone]   7.   What is your office fax number [phone]   8.   Anesthesia type (None, local, MAC, general) ? CHOICE   Devra Dopp 10/11/2018, 4:28 PM  _________________________________________________________________   (provider comments below)

## 2018-10-14 ENCOUNTER — Other Ambulatory Visit: Payer: Medicare Other

## 2018-10-15 NOTE — Telephone Encounter (Signed)
Patient with diagnosis of afib on xarelto for anticoagulation.    Procedure: LEFT TOTAL SHOULDER REPLACEMENT  Date of procedure: TBD  CHADS2-VASc score of  3 (CHF, HTN, AGE, DM2, stroke/tia x 2, CAD, AGE, female)  Per office protocol, patient can hold xarelto for 3 days prior to procedure.

## 2018-10-15 NOTE — Telephone Encounter (Signed)
Clinical pharmacist to review Xarelto 

## 2018-10-16 ENCOUNTER — Ambulatory Visit (INDEPENDENT_AMBULATORY_CARE_PROVIDER_SITE_OTHER): Payer: Medicare Other | Admitting: Urology

## 2018-10-16 DIAGNOSIS — N3021 Other chronic cystitis with hematuria: Secondary | ICD-10-CM | POA: Diagnosis not present

## 2018-10-16 DIAGNOSIS — R3915 Urgency of urination: Secondary | ICD-10-CM | POA: Diagnosis not present

## 2018-10-16 NOTE — Telephone Encounter (Signed)
Left message for the patient to call back and discuss with on call preop APP.

## 2018-10-21 NOTE — Telephone Encounter (Signed)
   Primary Cardiologist: Dina Rich, MD  Chart reviewed as part of pre-operative protocol coverage. Given past medical history and time since last visit, based on ACC/AHA guidelines, Diana Mata would be at acceptable risk for the planned procedure without further cardiovascular testing. Getting > 4 mets of activity.   I will route this recommendation to the requesting party via Epic fax function and remove from pre-op pool.  Please call with questions.  [place], [place] 10/21/2018, 8:43 AM

## 2018-10-29 DIAGNOSIS — H04123 Dry eye syndrome of bilateral lacrimal glands: Secondary | ICD-10-CM | POA: Diagnosis not present

## 2018-11-12 DIAGNOSIS — I483 Typical atrial flutter: Secondary | ICD-10-CM | POA: Diagnosis not present

## 2018-11-12 DIAGNOSIS — F419 Anxiety disorder, unspecified: Secondary | ICD-10-CM | POA: Diagnosis not present

## 2018-11-12 DIAGNOSIS — F331 Major depressive disorder, recurrent, moderate: Secondary | ICD-10-CM | POA: Diagnosis not present

## 2018-11-12 DIAGNOSIS — J449 Chronic obstructive pulmonary disease, unspecified: Secondary | ICD-10-CM | POA: Diagnosis not present

## 2018-11-20 ENCOUNTER — Ambulatory Visit: Payer: Medicare Other | Admitting: Urology

## 2018-11-20 ENCOUNTER — Other Ambulatory Visit (HOSPITAL_COMMUNITY)
Admission: AD | Admit: 2018-11-20 | Discharge: 2018-11-20 | Disposition: A | Discharge status: Home / Self Care | Payer: Medicare Other | Source: Skilled Nursing Facility | Attending: Urology | Admitting: Urology

## 2018-11-20 DIAGNOSIS — R300 Dysuria: Secondary | ICD-10-CM | POA: Diagnosis not present

## 2018-11-22 LAB — URINE CULTURE

## 2018-11-27 ENCOUNTER — Other Ambulatory Visit: Payer: Self-pay

## 2018-11-27 ENCOUNTER — Other Ambulatory Visit (HOSPITAL_COMMUNITY)
Admission: AD | Admit: 2018-11-27 | Discharge: 2018-11-27 | Disposition: A | Discharge status: Home / Self Care | Payer: Medicare Other | Source: Skilled Nursing Facility | Attending: Urology | Admitting: Urology

## 2018-11-27 ENCOUNTER — Ambulatory Visit (INDEPENDENT_AMBULATORY_CARE_PROVIDER_SITE_OTHER): Payer: Medicare Other | Admitting: Urology

## 2018-11-27 DIAGNOSIS — N3021 Other chronic cystitis with hematuria: Secondary | ICD-10-CM | POA: Diagnosis not present

## 2018-11-27 DIAGNOSIS — N390 Urinary tract infection, site not specified: Secondary | ICD-10-CM | POA: Insufficient documentation

## 2018-11-27 LAB — URINALYSIS, COMPLETE (UACMP) WITH MICROSCOPIC
Bilirubin Urine: NEGATIVE
Glucose, UA: NEGATIVE mg/dL
Hgb urine dipstick: NEGATIVE
Ketones, ur: NEGATIVE mg/dL
Nitrite: NEGATIVE
Protein, ur: 100 mg/dL — AB
Specific Gravity, Urine: 1.017 (ref 1.005–1.030)
WBC, UA: >50 WBC/hpf — ABNORMAL HIGH (ref 0–5)
pH: 5 (ref 5.0–8.0)

## 2018-11-29 LAB — URINE CULTURE: Culture: >=100000 — AB

## 2018-12-06 DIAGNOSIS — F419 Anxiety disorder, unspecified: Secondary | ICD-10-CM | POA: Diagnosis not present

## 2018-12-06 DIAGNOSIS — J449 Chronic obstructive pulmonary disease, unspecified: Secondary | ICD-10-CM | POA: Diagnosis not present

## 2018-12-06 DIAGNOSIS — I483 Typical atrial flutter: Secondary | ICD-10-CM | POA: Diagnosis not present

## 2018-12-06 DIAGNOSIS — F331 Major depressive disorder, recurrent, moderate: Secondary | ICD-10-CM | POA: Diagnosis not present

## 2018-12-06 DIAGNOSIS — L2389 Allergic contact dermatitis due to other agents: Secondary | ICD-10-CM | POA: Diagnosis not present

## 2018-12-16 DIAGNOSIS — R8279 Other abnormal findings on microbiological examination of urine: Secondary | ICD-10-CM | POA: Diagnosis not present

## 2018-12-16 DIAGNOSIS — N3021 Other chronic cystitis with hematuria: Secondary | ICD-10-CM | POA: Diagnosis not present

## 2018-12-16 DIAGNOSIS — N3000 Acute cystitis without hematuria: Secondary | ICD-10-CM | POA: Diagnosis not present

## 2018-12-18 ENCOUNTER — Other Ambulatory Visit: Payer: Self-pay | Admitting: *Deleted

## 2018-12-18 MED ORDER — DILTIAZEM HCL 30 MG PO TABS: 30.0000 mg | ORAL_TABLET | Freq: Two times a day (BID) | ORAL | 2 refills | Status: DC

## 2018-12-19 DIAGNOSIS — N390 Urinary tract infection, site not specified: Secondary | ICD-10-CM | POA: Diagnosis not present

## 2018-12-19 DIAGNOSIS — N3021 Other chronic cystitis with hematuria: Secondary | ICD-10-CM | POA: Diagnosis not present

## 2018-12-31 DIAGNOSIS — N3021 Other chronic cystitis with hematuria: Secondary | ICD-10-CM | POA: Diagnosis not present

## 2018-12-31 DIAGNOSIS — N390 Urinary tract infection, site not specified: Secondary | ICD-10-CM | POA: Diagnosis not present

## 2019-01-09 DIAGNOSIS — N3000 Acute cystitis without hematuria: Secondary | ICD-10-CM | POA: Diagnosis not present

## 2019-01-09 DIAGNOSIS — N3021 Other chronic cystitis with hematuria: Secondary | ICD-10-CM | POA: Diagnosis not present

## 2019-01-20 DIAGNOSIS — F419 Anxiety disorder, unspecified: Secondary | ICD-10-CM | POA: Diagnosis not present

## 2019-01-20 DIAGNOSIS — G4700 Insomnia, unspecified: Secondary | ICD-10-CM | POA: Diagnosis not present

## 2019-01-20 DIAGNOSIS — I483 Typical atrial flutter: Secondary | ICD-10-CM | POA: Diagnosis not present

## 2019-01-20 DIAGNOSIS — N390 Urinary tract infection, site not specified: Secondary | ICD-10-CM | POA: Diagnosis not present

## 2019-01-20 DIAGNOSIS — F331 Major depressive disorder, recurrent, moderate: Secondary | ICD-10-CM | POA: Diagnosis not present

## 2019-01-20 DIAGNOSIS — J449 Chronic obstructive pulmonary disease, unspecified: Secondary | ICD-10-CM | POA: Diagnosis not present

## 2019-01-20 DIAGNOSIS — L2389 Allergic contact dermatitis due to other agents: Secondary | ICD-10-CM | POA: Diagnosis not present

## 2019-01-20 DIAGNOSIS — R531 Weakness: Secondary | ICD-10-CM | POA: Diagnosis not present

## 2019-01-20 DIAGNOSIS — N3281 Overactive bladder: Secondary | ICD-10-CM | POA: Diagnosis not present

## 2019-01-20 DIAGNOSIS — R410 Disorientation, unspecified: Secondary | ICD-10-CM | POA: Diagnosis not present

## 2019-02-03 ENCOUNTER — Other Ambulatory Visit: Payer: Self-pay | Admitting: *Deleted

## 2019-02-03 ENCOUNTER — Encounter: Payer: Self-pay | Admitting: *Deleted

## 2019-02-03 ENCOUNTER — Telehealth: Payer: Self-pay | Admitting: *Deleted

## 2019-02-03 NOTE — Telephone Encounter (Signed)
Called patient, daughter Tresa Endo answered. Updated EMR. Tresa Endo will assist with video visit.

## 2019-02-04 ENCOUNTER — Ambulatory Visit (INDEPENDENT_AMBULATORY_CARE_PROVIDER_SITE_OTHER): Payer: Medicare Other | Admitting: Diagnostic Neuroimaging

## 2019-02-04 ENCOUNTER — Other Ambulatory Visit: Payer: Self-pay

## 2019-02-04 ENCOUNTER — Encounter: Payer: Self-pay | Admitting: Diagnostic Neuroimaging

## 2019-02-04 ENCOUNTER — Telehealth: Payer: Self-pay | Admitting: Diagnostic Neuroimaging

## 2019-02-04 DIAGNOSIS — G459 Transient cerebral ischemic attack, unspecified: Secondary | ICD-10-CM

## 2019-02-04 NOTE — Telephone Encounter (Signed)
Medicare/aarp order sent to [facility]. No auth they will reach out to the patient to schedule.

## 2019-02-04 NOTE — Progress Notes (Signed)
[facility]  PATIENT: Diana Mata DOB: [birth year]  REFERRING CLINICIAN: Z Hall HISTORY FROM: patient  REASON FOR VISIT: new consult    HISTORICAL  CHIEF COMPLAINT:  Chief Complaint  Patient presents with  . Transient Ischemic Attack  . Dizziness  . Altered Mental Status    HISTORY OF PRESENT ILLNESS:   85 year old female with atrial fibrillation here for evaluation of abnormal spells.  2 weeks ago patient had onset of feeling funny, difficulty getting words out, confused, slurred speech, leaning to the left side.  Symptoms lasted a few minutes to 20 minutes at a time.  She has had 5 attacks in the last 2 weeks.  Currently patient feels well and does not have any symptoms.  She denies any numbness or weakness on hands, arms, legs or feet.  She has a history of hip issues which does affect her balance walking.  She is on Xarelto and diltiazem for atrial fibrillation.    REVIEW OF SYSTEMS: Full 14 system review of systems performed and negative with exception of: As per HPI.  ALLERGIES: Allergies  Allergen Reactions  . Levaquin [Levofloxacin In D5w] Itching    Lower extremities  . Penicillins Nausea Only    Has patient had a PCN reaction causing immediate rash, facial/tongue/throat swelling, SOB or lightheadedness with hypotension: No Has patient had a PCN reaction causing severe rash involving mucus membranes or skin necrosis: No Has patient had a PCN reaction that required hospitalization: No Has patient had a PCN reaction occurring within the last 10 years: No If all of the above answers are "NO", then may proceed with Cephalosporin use.   . Sulfa Antibiotics Hives  . Tequin [Gatifloxacin] Itching    Lower extremities  . Latex Itching and Rash    HOME MEDICATIONS: Outpatient Medications Prior to Visit  Medication Sig Dispense Refill  . albuterol (PROVENTIL HFA;VENTOLIN HFA) 108 (90 Base) MCG/ACT inhaler Inhale 1-2 puffs into the lungs  every 6 (six) hours as needed for wheezing or shortness of breath.    . diazepam (VALIUM) 5 MG tablet Take 5-10 mg by mouth every 6 (six) hours as needed for anxiety.    Marland Kitchen diltiazem (CARDIZEM) 30 MG tablet Take 1 tablet (30 mg total) by mouth 2 (two) times daily. 180 tablet 2  . escitalopram (LEXAPRO) 5 MG tablet 5 mg daily.    Marland Kitchen guaiFENesin (MUCINEX) 600 MG 12 hr tablet Take 600 mg by mouth 2 (two) times daily.     Marland Kitchen HYDROcodone-acetaminophen (NORCO/VICODIN) 5-325 MG tablet Take 1 tablet by mouth 2 (two) times daily.    Marland Kitchen loperamide (IMODIUM) 2 MG capsule Take 2 mg by mouth as needed for diarrhea or loose stools.    . meclizine (ANTIVERT) 12.5 MG tablet Take 12.5 mg by mouth 3 (three) times daily as needed for dizziness.    . Methenamine Hippurate (HIPREX PO) Take 0.5 mg by mouth 2 (two) times a day.    . Multiple Vitamins-Minerals (PRESERVISION AREDS PO) Take by mouth.    Marland Kitchen MYRBETRIQ 50 MG TB24 tablet 50 mg daily.    Marland Kitchen omeprazole (PRILOSEC) 20 MG capsule Take 20 mg by mouth daily.    . ondansetron (ZOFRAN) 4 MG tablet Take 4 mg by mouth every 6 (six) hours as needed. for nausea    . RESTASIS 0.05 % ophthalmic emulsion INSTILL 1 DROP INTO EACH EYE TWICE DAILY    . Rivaroxaban (XARELTO) 15 MG TABS tablet Take 1 tablet (15 mg total) by mouth  every morning. 30 tablet 11  . tolterodine (DETROL LA) 4 MG 24 hr capsule Take 4 mg by mouth daily.    . traZODone (DESYREL) 50 MG tablet Take 50 mg by mouth at bedtime.     No facility-administered medications prior to visit.     PAST MEDICAL HISTORY: Past Medical History:  Diagnosis Date  . Atrial flutter (HCC)   . Chronic UTI   . COPD (chronic obstructive pulmonary disease) (HCC)   . Dizziness   . GERD (gastroesophageal reflux disease)   . IBS (irritable bowel syndrome)   . Renal disorder    renal insufficiency    PAST SURGICAL HISTORY: Past Surgical History:  Procedure Laterality Date  . ABDOMINAL HYSTERECTOMY    . cyst removed from brain     . JOINT REPLACEMENT      FAMILY HISTORY: Family History  Problem Relation Age of Onset  . Hypertension Mother     SOCIAL HISTORY: Social History   Socioeconomic History  . Marital status: Widowed    Spouse name: Not on file  . Number of children: 3  . Years of education: Not on file  . Highest education level: Bachelor's degree (e.g., BA, AB, BS)  Occupational History  . Occupation: Retired  Engineer, production  . Financial resource strain: Not on file  . Food insecurity:    Worry: Not on file    Inability: Not on file  . Transportation needs:    Medical: Not on file    Non-medical: Not on file  Tobacco Use  . Smoking status: Never Smoker  . Smokeless tobacco: Never Used  Substance and Sexual Activity  . Alcohol use: Never    Frequency: Never    Comment: occ  . Drug use: Never  . Sexual activity: Not on file  Lifestyle  . Physical activity:    Days per week: Not on file    Minutes per session: Not on file  . Stress: Not on file  Relationships  . Social connections:    Talks on phone: Not on file    Gets together: Not on file    Attends religious service: Not on file    Active member of club or organization: Not on file    Attends meetings of clubs or organizations: Not on file    Relationship status: Not on file  . Intimate partner violence:    Fear of current or ex partner: Not on file    Emotionally abused: Not on file    Physically abused: Not on file    Forced sexual activity: Not on file  Other Topics Concern  . Not on file  Social History Narrative   Patient moved to [place] from [place] in August 2019, to be near family   02/03/19 lives alone , 1 mile from daughter, Tresa Endo     PHYSICAL EXAM   VIDEO EXAM  GENERAL EXAM/CONSTITUTIONAL:  Vitals: There were no vitals filed for this visit.  There is no height or weight on file to calculate BMI. Wt Readings from Last 3 Encounters:  09/12/18 133 lb (60.3 kg)  06/30/18 132 lb (59.9 kg)   06/06/18 130 lb (59 kg)     Patient is in no distress; well developed, nourished and groomed; neck is supple   NEUROLOGIC: MENTAL STATUS:  No flowsheet data found.  awake, alert, oriented to person, place and time  [employer] memory   DECR attention and concentration  language fluent, comprehension intact, naming intact  fund  of knowledge appropriate  CRANIAL NERVE:   2nd, 3rd, 4th, 6th - visual fields full to confrontation, extraocular muscles intact, no nystagmus  5th - facial sensation symmetric  7th - facial strength symmetric  8th - hearing DECR  11th - shoulder shrug symmetric  12th - tongue protrusion midline  MOTOR:   NO TREMOR; NO DRIFT IN BUE  SENSORY:   normal and symmetric to light touch  COORDINATION:   fine finger movements normal    DIAGNOSTIC DATA (LABS, IMAGING, TESTING) - I reviewed patient records, labs, notes, testing and imaging myself where available.  Lab Results  Component Value Date   WBC 12.1 (H) 06/30/2018   HGB 10.8 (L) 06/30/2018   HCT 34.6 (L) 06/30/2018   MCV 86.9 06/30/2018   PLT 380 06/30/2018      Component Value Date/Time   NA 135 06/30/2018 1850   K 3.8 06/30/2018 1850   CL 108 06/30/2018 1850   CO2 19 (L) 06/30/2018 1850   GLUCOSE 112 (H) 06/30/2018 1850   BUN 20 06/30/2018 1850   CREATININE 1.10 (H) 10/07/2018 1623   CALCIUM 9.1 06/30/2018 1850   PROT 7.4 06/30/2018 1850   ALBUMIN 3.6 06/30/2018 1850   AST 12 (L) 06/30/2018 1850   ALT 12 06/30/2018 1850   ALKPHOS 66 06/30/2018 1850   BILITOT 0.5 06/30/2018 1850   GFRNONAA 46 (L) 06/30/2018 1850   GFRAA 54 (L) 06/30/2018 1850   No results found for: CHOL, HDL, LDLCALC, LDLDIRECT, TRIG, CHOLHDL No results found for: ZOXW9UHGBA1C No results found for: VITAMINB12 No results found for: TSH     ASSESSMENT AND PLAN  85 y.o. year old female here with atrial fibrillation, with multiple episodes of transient word finding difficulties, confusion, slurred speech,  dizziness, concerning for transient ischemic attacks.   Dx:  1. TIA (transient ischemic attack)    Virtual Visit via Video Note  I connected with Barbar Mata on 02/04/19 at  2:30 PM EDT by a video enabled telemedicine application and verified that I am speaking with the correct person using two identifiers.  Location: Patient: home Provider: office   I discussed the limitations of evaluation and management by telemedicine and the availability of in person appointments. The patient expressed understanding and agreed to proceed.  I discussed the assessment and treatment plan with the patient. The patient was provided an opportunity to ask questions and all were answered. The patient agreed with the plan and demonstrated an understanding of the instructions.   The patient was advised to call back or seek an in-person evaluation if the symptoms worsen or if the condition fails to improve as anticipated.  I provided 25 minutes of non-face-to-face time during this encounter.  I reviewed images, labs, notes, records myself. I summarized findings and reviewed with patient, for this high risk condition (TIA) requiring high complexity decision making.    PLAN:  - check MRI brain  - check carotid u/s - continue xarelto, diltiazem for afib  Orders Placed This Encounter  Procedures  . MR BRAIN WO CONTRAST   Return in about 6 months (around 08/06/2019).    Suanne MarkerVIKRAM R. Armani Gawlik, MD 02/04/2019, 2:42 PM Certified in Neurology, Neurophysiology and Neuroimaging  [facility] [address], [address] [place], [place] [id number] [phone]

## 2019-02-05 ENCOUNTER — Telehealth (HOSPITAL_COMMUNITY): Payer: Self-pay | Admitting: *Deleted

## 2019-02-05 NOTE — Telephone Encounter (Signed)
Left voicemail asking patient to call our office to schedule appointment.

## 2019-02-14 DIAGNOSIS — D519 Vitamin B12 deficiency anemia, unspecified: Secondary | ICD-10-CM | POA: Diagnosis not present

## 2019-02-18 ENCOUNTER — Telehealth (HOSPITAL_COMMUNITY): Payer: Self-pay | Admitting: Rehabilitation

## 2019-02-18 NOTE — Telephone Encounter (Signed)
The above patient or their representative was contacted and gave the following answers to these questions:         Do you have any of the following symptoms? No Fever                    Cough                   Shortness of breath  Do  you have any of the following other symptoms? No  muscle pain         vomiting,        diarrhea        rash         weakness        red eye        abdominal pain         bruising         bleeding              joint pain           severe headache  Have you been in contact with someone who was or has been sick in the past 2 weeks? No Yes                 Unsure                         Unable to assess   Does the person that you were in contact with have any of the following symptoms?  Cough         shortness of breath           muscle pain         vomiting,            diarrhea            rash            weakness           fever            red eye           abdominal pain          bruising  or  bleeding                joint pain                severe headache             Have you  or someone you have been in contact with traveled internationally in the last month?  No      If yes, which countries?  Have you  or someone you have been in contact with traveled outside [place] in the last month?  No      If yes, which state and city?  COMMENTS OR ACTION PLAN FOR THIS PATIENT:    

## 2019-02-19 ENCOUNTER — Other Ambulatory Visit: Payer: Medicare Other

## 2019-02-19 ENCOUNTER — Ambulatory Visit
Admission: RE | Admit: 2019-02-19 | Discharge: 2019-02-19 | Disposition: A | Discharge status: Home / Self Care | Payer: Medicare Other | Source: Ambulatory Visit | Attending: Diagnostic Neuroimaging | Admitting: Diagnostic Neuroimaging

## 2019-02-19 ENCOUNTER — Ambulatory Visit (HOSPITAL_COMMUNITY)
Admission: RE | Admit: 2019-02-19 | Discharge: 2019-02-19 | Disposition: A | Discharge status: Home / Self Care | Payer: Medicare Other | Source: Ambulatory Visit | Attending: Diagnostic Neuroimaging | Admitting: Diagnostic Neuroimaging

## 2019-02-19 ENCOUNTER — Other Ambulatory Visit: Payer: Self-pay

## 2019-02-19 DIAGNOSIS — F419 Anxiety disorder, unspecified: Secondary | ICD-10-CM | POA: Diagnosis not present

## 2019-02-19 DIAGNOSIS — J449 Chronic obstructive pulmonary disease, unspecified: Secondary | ICD-10-CM | POA: Diagnosis not present

## 2019-02-19 DIAGNOSIS — G459 Transient cerebral ischemic attack, unspecified: Secondary | ICD-10-CM

## 2019-02-19 DIAGNOSIS — F331 Major depressive disorder, recurrent, moderate: Secondary | ICD-10-CM | POA: Diagnosis not present

## 2019-02-19 DIAGNOSIS — I483 Typical atrial flutter: Secondary | ICD-10-CM | POA: Diagnosis not present

## 2019-02-21 DIAGNOSIS — D519 Vitamin B12 deficiency anemia, unspecified: Secondary | ICD-10-CM | POA: Diagnosis not present

## 2019-02-25 ENCOUNTER — Telehealth: Payer: Self-pay | Admitting: *Deleted

## 2019-02-25 NOTE — Telephone Encounter (Signed)
Called and spoke with daughter, Juliann Pulse. Informed her that patient's MRI brain and carotid US results are unremarkable, no new findings on MRI; it is stable from CT head done Oct 2019. Juliann Pulse  verbalized understanding, appreciation.

## 2019-02-28 DIAGNOSIS — D519 Vitamin B12 deficiency anemia, unspecified: Secondary | ICD-10-CM | POA: Diagnosis not present

## 2019-03-05 DIAGNOSIS — F419 Anxiety disorder, unspecified: Secondary | ICD-10-CM | POA: Diagnosis not present

## 2019-03-05 DIAGNOSIS — I483 Typical atrial flutter: Secondary | ICD-10-CM | POA: Diagnosis not present

## 2019-03-05 DIAGNOSIS — J449 Chronic obstructive pulmonary disease, unspecified: Secondary | ICD-10-CM | POA: Diagnosis not present

## 2019-03-05 DIAGNOSIS — F331 Major depressive disorder, recurrent, moderate: Secondary | ICD-10-CM | POA: Diagnosis not present

## 2019-03-11 DIAGNOSIS — D519 Vitamin B12 deficiency anemia, unspecified: Secondary | ICD-10-CM | POA: Diagnosis not present

## 2019-03-28 DIAGNOSIS — E782 Mixed hyperlipidemia: Secondary | ICD-10-CM | POA: Diagnosis not present

## 2019-03-28 DIAGNOSIS — J449 Chronic obstructive pulmonary disease, unspecified: Secondary | ICD-10-CM | POA: Diagnosis not present

## 2019-03-28 DIAGNOSIS — D519 Vitamin B12 deficiency anemia, unspecified: Secondary | ICD-10-CM | POA: Diagnosis not present

## 2019-04-07 DIAGNOSIS — K219 Gastro-esophageal reflux disease without esophagitis: Secondary | ICD-10-CM | POA: Diagnosis not present

## 2019-04-07 DIAGNOSIS — I483 Typical atrial flutter: Secondary | ICD-10-CM | POA: Diagnosis not present

## 2019-04-07 DIAGNOSIS — M25512 Pain in left shoulder: Secondary | ICD-10-CM | POA: Diagnosis not present

## 2019-04-07 DIAGNOSIS — J449 Chronic obstructive pulmonary disease, unspecified: Secondary | ICD-10-CM | POA: Diagnosis not present

## 2019-04-07 DIAGNOSIS — D649 Anemia, unspecified: Secondary | ICD-10-CM | POA: Diagnosis not present

## 2019-04-07 DIAGNOSIS — G4700 Insomnia, unspecified: Secondary | ICD-10-CM | POA: Diagnosis not present

## 2019-04-07 DIAGNOSIS — F331 Major depressive disorder, recurrent, moderate: Secondary | ICD-10-CM | POA: Diagnosis not present

## 2019-04-07 DIAGNOSIS — R42 Dizziness and giddiness: Secondary | ICD-10-CM | POA: Diagnosis not present

## 2019-04-07 DIAGNOSIS — F411 Generalized anxiety disorder: Secondary | ICD-10-CM | POA: Diagnosis not present

## 2019-04-07 DIAGNOSIS — R682 Dry mouth, unspecified: Secondary | ICD-10-CM | POA: Diagnosis not present

## 2019-04-07 DIAGNOSIS — N3281 Overactive bladder: Secondary | ICD-10-CM | POA: Diagnosis not present

## 2019-04-09 ENCOUNTER — Ambulatory Visit: Payer: Medicare Other | Admitting: Urology

## 2019-04-14 DIAGNOSIS — I483 Typical atrial flutter: Secondary | ICD-10-CM | POA: Diagnosis not present

## 2019-04-14 DIAGNOSIS — F331 Major depressive disorder, recurrent, moderate: Secondary | ICD-10-CM | POA: Diagnosis not present

## 2019-04-14 DIAGNOSIS — J449 Chronic obstructive pulmonary disease, unspecified: Secondary | ICD-10-CM | POA: Diagnosis not present

## 2019-04-14 DIAGNOSIS — F419 Anxiety disorder, unspecified: Secondary | ICD-10-CM | POA: Diagnosis not present

## 2019-05-02 DIAGNOSIS — D519 Vitamin B12 deficiency anemia, unspecified: Secondary | ICD-10-CM | POA: Diagnosis not present

## 2019-05-14 DIAGNOSIS — M25512 Pain in left shoulder: Secondary | ICD-10-CM | POA: Diagnosis not present

## 2019-05-14 DIAGNOSIS — R682 Dry mouth, unspecified: Secondary | ICD-10-CM | POA: Diagnosis not present

## 2019-05-14 DIAGNOSIS — R42 Dizziness and giddiness: Secondary | ICD-10-CM | POA: Diagnosis not present

## 2019-05-14 DIAGNOSIS — F331 Major depressive disorder, recurrent, moderate: Secondary | ICD-10-CM | POA: Diagnosis not present

## 2019-05-14 DIAGNOSIS — I483 Typical atrial flutter: Secondary | ICD-10-CM | POA: Diagnosis not present

## 2019-05-14 DIAGNOSIS — J449 Chronic obstructive pulmonary disease, unspecified: Secondary | ICD-10-CM | POA: Diagnosis not present

## 2019-05-14 DIAGNOSIS — D649 Anemia, unspecified: Secondary | ICD-10-CM | POA: Diagnosis not present

## 2019-05-14 DIAGNOSIS — G4700 Insomnia, unspecified: Secondary | ICD-10-CM | POA: Diagnosis not present

## 2019-05-14 DIAGNOSIS — N3281 Overactive bladder: Secondary | ICD-10-CM | POA: Diagnosis not present

## 2019-05-14 DIAGNOSIS — K219 Gastro-esophageal reflux disease without esophagitis: Secondary | ICD-10-CM | POA: Diagnosis not present

## 2019-05-14 DIAGNOSIS — F411 Generalized anxiety disorder: Secondary | ICD-10-CM | POA: Diagnosis not present

## 2019-05-21 ENCOUNTER — Other Ambulatory Visit (HOSPITAL_COMMUNITY)
Admission: RE | Admit: 2019-05-21 | Discharge: 2019-05-21 | Disposition: A | Discharge status: Home / Self Care | Payer: Medicare Other | Source: Ambulatory Visit | Attending: Urology | Admitting: Urology

## 2019-05-21 ENCOUNTER — Ambulatory Visit (INDEPENDENT_AMBULATORY_CARE_PROVIDER_SITE_OTHER): Payer: Medicare Other | Admitting: Urology

## 2019-05-21 DIAGNOSIS — N3021 Other chronic cystitis with hematuria: Secondary | ICD-10-CM | POA: Diagnosis not present

## 2019-05-23 LAB — URINE CULTURE: Culture: >=100000 — AB

## 2019-06-09 DIAGNOSIS — S51002A Unspecified open wound of left elbow, initial encounter: Secondary | ICD-10-CM | POA: Diagnosis not present

## 2019-06-12 DIAGNOSIS — G4700 Insomnia, unspecified: Secondary | ICD-10-CM | POA: Diagnosis not present

## 2019-06-12 DIAGNOSIS — K219 Gastro-esophageal reflux disease without esophagitis: Secondary | ICD-10-CM | POA: Diagnosis not present

## 2019-06-12 DIAGNOSIS — R682 Dry mouth, unspecified: Secondary | ICD-10-CM | POA: Diagnosis not present

## 2019-06-12 DIAGNOSIS — I483 Typical atrial flutter: Secondary | ICD-10-CM | POA: Diagnosis not present

## 2019-06-12 DIAGNOSIS — N3281 Overactive bladder: Secondary | ICD-10-CM | POA: Diagnosis not present

## 2019-06-12 DIAGNOSIS — R42 Dizziness and giddiness: Secondary | ICD-10-CM | POA: Diagnosis not present

## 2019-06-12 DIAGNOSIS — M25512 Pain in left shoulder: Secondary | ICD-10-CM | POA: Diagnosis not present

## 2019-06-12 DIAGNOSIS — D649 Anemia, unspecified: Secondary | ICD-10-CM | POA: Diagnosis not present

## 2019-06-12 DIAGNOSIS — F411 Generalized anxiety disorder: Secondary | ICD-10-CM | POA: Diagnosis not present

## 2019-06-12 DIAGNOSIS — F331 Major depressive disorder, recurrent, moderate: Secondary | ICD-10-CM | POA: Diagnosis not present

## 2019-06-12 DIAGNOSIS — J449 Chronic obstructive pulmonary disease, unspecified: Secondary | ICD-10-CM | POA: Diagnosis not present

## 2019-07-11 DIAGNOSIS — F331 Major depressive disorder, recurrent, moderate: Secondary | ICD-10-CM | POA: Diagnosis not present

## 2019-07-11 DIAGNOSIS — J449 Chronic obstructive pulmonary disease, unspecified: Secondary | ICD-10-CM | POA: Diagnosis not present

## 2019-07-11 DIAGNOSIS — R42 Dizziness and giddiness: Secondary | ICD-10-CM | POA: Diagnosis not present

## 2019-07-11 DIAGNOSIS — I483 Typical atrial flutter: Secondary | ICD-10-CM | POA: Diagnosis not present

## 2019-07-11 DIAGNOSIS — K219 Gastro-esophageal reflux disease without esophagitis: Secondary | ICD-10-CM | POA: Diagnosis not present

## 2019-07-11 DIAGNOSIS — G4700 Insomnia, unspecified: Secondary | ICD-10-CM | POA: Diagnosis not present

## 2019-07-11 DIAGNOSIS — R682 Dry mouth, unspecified: Secondary | ICD-10-CM | POA: Diagnosis not present

## 2019-07-11 DIAGNOSIS — M25512 Pain in left shoulder: Secondary | ICD-10-CM | POA: Diagnosis not present

## 2019-07-11 DIAGNOSIS — N3281 Overactive bladder: Secondary | ICD-10-CM | POA: Diagnosis not present

## 2019-07-11 DIAGNOSIS — D649 Anemia, unspecified: Secondary | ICD-10-CM | POA: Diagnosis not present

## 2019-07-11 DIAGNOSIS — F411 Generalized anxiety disorder: Secondary | ICD-10-CM | POA: Diagnosis not present

## 2019-07-30 DIAGNOSIS — Z23 Encounter for immunization: Secondary | ICD-10-CM | POA: Diagnosis not present

## 2019-09-08 DIAGNOSIS — F411 Generalized anxiety disorder: Secondary | ICD-10-CM | POA: Diagnosis not present

## 2019-09-08 DIAGNOSIS — D649 Anemia, unspecified: Secondary | ICD-10-CM | POA: Diagnosis not present

## 2019-09-08 DIAGNOSIS — N3281 Overactive bladder: Secondary | ICD-10-CM | POA: Diagnosis not present

## 2019-09-08 DIAGNOSIS — J449 Chronic obstructive pulmonary disease, unspecified: Secondary | ICD-10-CM | POA: Diagnosis not present

## 2019-09-08 DIAGNOSIS — K219 Gastro-esophageal reflux disease without esophagitis: Secondary | ICD-10-CM | POA: Diagnosis not present

## 2019-09-08 DIAGNOSIS — G4700 Insomnia, unspecified: Secondary | ICD-10-CM | POA: Diagnosis not present

## 2019-09-08 DIAGNOSIS — F331 Major depressive disorder, recurrent, moderate: Secondary | ICD-10-CM | POA: Diagnosis not present

## 2019-09-08 DIAGNOSIS — I483 Typical atrial flutter: Secondary | ICD-10-CM | POA: Diagnosis not present

## 2019-09-10 DIAGNOSIS — H6123 Impacted cerumen, bilateral: Secondary | ICD-10-CM | POA: Diagnosis not present

## 2019-09-10 DIAGNOSIS — D519 Vitamin B12 deficiency anemia, unspecified: Secondary | ICD-10-CM | POA: Diagnosis not present

## 2019-09-12 ENCOUNTER — Other Ambulatory Visit: Payer: Self-pay | Admitting: Student

## 2019-09-12 NOTE — Telephone Encounter (Signed)
This is a [place] pt.  °

## 2019-09-18 ENCOUNTER — Encounter: Payer: Self-pay | Admitting: Hospital

## 2019-09-24 ENCOUNTER — Other Ambulatory Visit (HOSPITAL_COMMUNITY)
Admission: RE | Admit: 2019-09-24 | Discharge: 2019-09-24 | Disposition: A | Discharge status: Home / Self Care | Payer: PPO | Source: Ambulatory Visit | Attending: Urology | Admitting: Urology

## 2019-09-24 ENCOUNTER — Other Ambulatory Visit: Payer: Self-pay

## 2019-09-24 ENCOUNTER — Encounter: Payer: Self-pay | Admitting: Urology

## 2019-09-24 ENCOUNTER — Ambulatory Visit (INDEPENDENT_AMBULATORY_CARE_PROVIDER_SITE_OTHER): Payer: PPO | Admitting: Urology

## 2019-09-24 VITALS — BP 111/66 | HR 88 | Temp 96.8°F | Ht 63.0 in | Wt 123.0 lb

## 2019-09-24 DIAGNOSIS — N3021 Other chronic cystitis with hematuria: Secondary | ICD-10-CM | POA: Insufficient documentation

## 2019-09-24 DIAGNOSIS — N3281 Overactive bladder: Secondary | ICD-10-CM | POA: Diagnosis not present

## 2019-09-24 LAB — POCT URINALYSIS DIPSTICK
Bilirubin, UA: NEGATIVE
Glucose, UA: NEGATIVE
Ketones, UA: NEGATIVE
Nitrite, UA: POSITIVE
Protein, UA: POSITIVE — AB
Spec Grav, UA: 1.025 (ref 1.010–1.025)
Urobilinogen, UA: NEGATIVE E.U./dL — AB
pH, UA: 5 (ref 5.0–8.0)

## 2019-09-24 NOTE — Patient Instructions (Signed)
Urinary Tract Infection, Adult A urinary tract infection (UTI) is an infection of any part of the urinary tract. The urinary tract includes:  The kidneys.  The ureters.  The bladder.  The urethra. These organs make, store, and get rid of pee (urine) in the body. What are the causes? This is caused by germs (bacteria) in your genital area. These germs grow and cause swelling (inflammation) of your urinary tract. What increases the risk? You are more likely to develop this condition if:  You have a small, thin tube (catheter) to drain pee.  You cannot control when you pee or poop (incontinence).  You are female, and: ? You use these methods to prevent pregnancy:  A medicine that kills sperm (spermicide).  A device that blocks sperm (diaphragm). ? You have low levels of a female hormone (estrogen). ? You are pregnant.  You have genes that add to your risk.  You are sexually active.  You take antibiotic medicines.  You have trouble peeing because of: ? A prostate that is bigger than normal, if you are female. ? A blockage in the part of your body that drains pee from the bladder (urethra). ? A kidney stone. ? A nerve condition that affects your bladder (neurogenic bladder). ? Not getting enough to drink. ? Not peeing often enough.  You have other conditions, such as: ? Diabetes. ? A weak disease-fighting system (immune system). ? Sickle cell disease. ? Gout. ? Injury of the spine. What are the signs or symptoms? Symptoms of this condition include:  Needing to pee right away (urgently).  Peeing often.  Peeing small amounts often.  Pain or burning when peeing.  Blood in the pee.  Pee that smells bad or not like normal.  Trouble peeing.  Pee that is cloudy.  Fluid coming from the vagina, if you are female.  Pain in the belly or lower back. Other symptoms include:  Throwing up (vomiting).  No urge to eat.  Feeling mixed up (confused).  Being tired  and grouchy (irritable).  A fever.  Watery poop (diarrhea). How is this treated? This condition may be treated with:  Antibiotic medicine.  Other medicines.  Drinking enough water. Follow these instructions at home:  Medicines  Take over-the-counter and prescription medicines only as told by your doctor.  If you were prescribed an antibiotic medicine, take it as told by your doctor. Do not stop taking it even if you start to feel better. General instructions  Make sure you: ? Pee until your bladder is empty. ? Do not hold pee for a long time. ? Empty your bladder after sex. ? Wipe from front to back after pooping if you are a female. Use each tissue one time when you wipe.  Drink enough fluid to keep your pee pale yellow.  Keep all follow-up visits as told by your doctor. This is important. Contact a doctor if:  You do not get better after 1-2 days.  Your symptoms go away and then come back. Get help right away if:  You have very bad back pain.  You have very bad pain in your lower belly.  You have a fever.  You are sick to your stomach (nauseous).  You are throwing up. Summary  A urinary tract infection (UTI) is an infection of any part of the urinary tract.  This condition is caused by germs in your genital area.  There are many risk factors for a UTI. These include having a small, thin   tube to drain pee and not being able to control when you pee or poop.  Treatment includes antibiotic medicines for germs.  Drink enough fluid to keep your pee pale yellow. This information is not intended to replace advice given to you by your health care provider. Make sure you discuss any questions you have with your health care provider. Document Revised: 08/08/2018 Document Reviewed: 02/28/2018 Elsevier Patient Education  2020 [employer] Inc[employer]  

## 2019-09-24 NOTE — Progress Notes (Signed)
Urological Symptom Review  Patient is experiencing the following symptoms: Frequent urination  Nocturia Review of Systems  Gastrointestinal (upper)  : Negative for upper GI symptoms  Gastrointestinal (lower) : Negative for lower GI symptoms  Constitutional : Negative for symptoms  Skin: Negative for skin symptoms  Eyes: Negative for eye symptoms  Ear/Nose/Throat : Sinus problems  Hematologic/Lymphatic: Negative for Hematologic/Lymphatic symptoms  Cardiovascular : Negative for cardiovascular symptoms  Respiratory : Negative for respiratory symptoms  Endocrine: Negative for endocrine symptoms  Musculoskeletal: Negative for musculoskeletal symptoms  Neurological: Negative for neurological symptoms  Psychologic: Negative for psychiatric symptoms

## 2019-09-24 NOTE — Addendum Note (Signed)
Addended byGustavus Messing on: 09/24/2019 04:53 PM   Modules accepted: Orders

## 2019-09-24 NOTE — Progress Notes (Signed)
09/24/2019 4:18 PM   Tamsyn Cheek-Eurich [birth year] [id number]  Referring provider: Benita Stabile, MD [address] [place],  [place] [address]  Chronic cystitis and urinary urgency  HPI: Ms Gammage is a 86yo with a hx of chronic cystitis and OAb here for followup. She is currently on mirabegron in Am, trospium at night and macrobid QHS. She continues to soak her bed at night. Rare incontinence during the day. Incontinence is worsening. NO dysuria. No UTIs since last visit.    PMH: Past Medical History:  Diagnosis Date  . Atrial flutter (HCC)   . Chronic UTI   . COPD (chronic obstructive pulmonary disease) (HCC)   . Dizziness   . GERD (gastroesophageal reflux disease)   . IBS (irritable bowel syndrome)   . Renal disorder    renal insufficiency    Surgical History: Past Surgical History:  Procedure Laterality Date  . ABDOMINAL HYSTERECTOMY    . 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 JOINT REPLACEMENT      Home Medications:  Allergies as of 09/24/2019      Reactions   Levaquin [levofloxacin In D5w] Itching   Lower extremities   Penicillins Nausea Only   Has patient had a PCN reaction causing immediate rash, facial/tongue/throat swelling, SOB or lightheadedness with hypotension: No Has patient had a PCN reaction causing severe rash involving mucus membranes or skin necrosis: No Has patient had a PCN reaction that required hospitalization: No Has patient had a PCN reaction occurring within the last 10 years: No If all of the above answers are "NO", then may proceed with Cephalosporin use.   Sulfa Antibiotics Hives   Tequin [gatifloxacin] Itching   Lower extremities   Latex Itching, Rash      Medication List       Accurate as of September 24, 2019  4:18 PM. If you have any questions, ask your nurse or doctor.        albuterol 108 (90 Base) MCG/ACT inhaler Commonly known as: VENTOLIN HFA Inhale 1-2 puffs into the lungs every 6 (six) hours as needed for wheezing or  shortness of breath.   diazepam 5 MG tablet Commonly known as: VALIUM Take 5-10 mg by mouth every 6 (six) hours as needed for anxiety.   diltiazem 30 MG tablet Commonly known as: CARDIZEM TAKE 1 TABLET (30 MG TOTAL) BY MOUTH 2 (TWO) TIMES DAILY.   escitalopram 5 MG tablet Commonly known as: LEXAPRO 5 mg daily.   guaiFENesin 600 MG 12 hr tablet Commonly known as: MUCINEX Take 600 mg by mouth 2 (two) times daily.   HIPREX PO Take 0.5 mg by mouth 2 (two) times a day.   HYDROcodone-acetaminophen 5-325 MG tablet Commonly known as: NORCO/VICODIN Take 1 tablet by mouth 2 (two) times daily.   loperamide 2 MG capsule Commonly known as: IMODIUM Take 2 mg by mouth as needed for diarrhea or loose stools.   meclizine 12.5 MG tablet Commonly known as: ANTIVERT Take 12.5 mg by mouth 3 (three) times daily as needed for dizziness.   Myrbetriq 50 MG Tb24 tablet Generic drug: mirabegron ER 50 mg daily.   omeprazole 20 MG capsule Commonly known as: PRILOSEC Take 20 mg by mouth daily.   ondansetron 4 MG tablet Commonly known as: ZOFRAN Take 4 mg by mouth every 6 (six) hours as needed. for nausea   PRESERVISION AREDS PO Take by mouth.   Restasis 0.05 % ophthalmic emulsion Generic drug: cycloSPORINE INSTILL 1 DROP INTO [facility]  EYE TWICE DAILY   Rivaroxaban 15 MG Tabs tablet Commonly known as: Xarelto Take 1 tablet (15 mg total) by mouth every morning.   tolterodine 4 MG 24 hr capsule Commonly known as: DETROL LA Take 4 mg by mouth daily.   traZODone 50 MG tablet Commonly known as: DESYREL Take 50 mg by mouth at bedtime.       Allergies:  Allergies  Allergen Reactions  . Levaquin [Levofloxacin In D5w] Itching    Lower extremities  . Penicillins Nausea Only    Has patient had a PCN reaction causing immediate rash, facial/tongue/throat swelling, SOB or lightheadedness with hypotension: No Has patient had a PCN reaction causing severe rash involving mucus membranes or skin  necrosis: No Has patient had a PCN reaction that required hospitalization: No Has patient had a PCN reaction occurring within the last 10 years: No If all of the above answers are "NO", then may proceed with Cephalosporin use.   . Sulfa Antibiotics Hives  . Tequin [Gatifloxacin] Itching    Lower extremities  . Latex Itching and Rash    Family History: Family History  Problem Relation Age of Onset  . Hypertension Mother     Social History:  reports that she has never smoked. She has never used smokeless tobacco. She reports that she does not drink alcohol or use drugs.  ROS: All other review of systems were reviewed and are negative except what is noted above in HPI  Physical Exam: BP 111/66   Pulse 88   Temp (!) 96.8 F (36 C)   Ht 5\' 3"  (1.6 m)   Wt 123 lb (55.8 kg)   BMI 21.79 kg/m   Constitutional:  Alert and oriented, No acute distress. HEENT: [place] AT, moist mucus membranes.  Trachea midline, no masses. Cardiovascular: No clubbing, cyanosis, or edema. Respiratory: Normal respiratory effort, no increased work of breathing. GI: Abdomen is soft, nontender, nondistended, no abdominal masses GU: No CVA tenderness Lymph: No cervical or inguinal lymphadenopathy. Skin: No rashes, bruises or suspicious lesions. Neurologic: Grossly intact, no focal deficits, moving all 4 extremities. Psychiatric: Normal mood and affect.  Laboratory Data: Lab Results  Component Value Date   WBC 12.1 (H) 06/30/2018   HGB 10.8 (L) 06/30/2018   HCT 34.6 (L) 06/30/2018   MCV 86.9 06/30/2018   PLT 380 06/30/2018    Lab Results  Component Value Date   CREATININE 1.10 (H) 10/07/2018    No results found for: PSA  No results found for: TESTOSTERONE  No results found for: HGBA1C  Urinalysis    Component Value Date/Time   COLORURINE YELLOW 11/27/2018 1535   APPEARANCEUR TURBID (A) 11/27/2018 1535   LABSPEC 1.017 11/27/2018 1535   PHURINE 5.0 11/27/2018 1535   GLUCOSEU NEGATIVE  11/27/2018 1535   HGBUR NEGATIVE 11/27/2018 1535   [place] NEGATIVE 11/27/2018 [place] 11/27/2018 1535   PROTEINUR 100 (A) 11/27/2018 1535   NITRITE NEGATIVE 11/27/2018 1535   LEUKOCYTESUR MODERATE (A) 11/27/2018 1535    Lab Results  Component Value Date   BACTERIA FEW (A) 11/27/2018    Pertinent Imaging:  No results found for this or any previous visit. No results found for this or any previous visit. No results found for this or any previous visit. No results found for this or any previous visit. No results found for this or any previous visit. No results found for this or any previous visit. No results found for this or any previous visit. No results  found for this or any previous visit.  Assessment & Plan:    1. Chronic cystitis with hematuria Urine for culture, wikl call with results -continue macrobid 50mg  qhs  2. OAB (overactive bladder) -mirabegron 50mg  qhs -trospium 60mg  in AM -RTC 4-6 weeks    No follow-ups on file.  , MD  [facility] [place]

## 2019-09-27 LAB — URINE CULTURE: Culture: >=100000 — AB

## 2019-10-07 DIAGNOSIS — D649 Anemia, unspecified: Secondary | ICD-10-CM | POA: Diagnosis not present

## 2019-10-07 DIAGNOSIS — N3281 Overactive bladder: Secondary | ICD-10-CM | POA: Diagnosis not present

## 2019-10-07 DIAGNOSIS — M25512 Pain in left shoulder: Secondary | ICD-10-CM | POA: Diagnosis not present

## 2019-10-07 DIAGNOSIS — K219 Gastro-esophageal reflux disease without esophagitis: Secondary | ICD-10-CM | POA: Diagnosis not present

## 2019-10-07 DIAGNOSIS — R682 Dry mouth, unspecified: Secondary | ICD-10-CM | POA: Diagnosis not present

## 2019-10-07 DIAGNOSIS — J449 Chronic obstructive pulmonary disease, unspecified: Secondary | ICD-10-CM | POA: Diagnosis not present

## 2019-10-07 DIAGNOSIS — R42 Dizziness and giddiness: Secondary | ICD-10-CM | POA: Diagnosis not present

## 2019-10-07 DIAGNOSIS — F411 Generalized anxiety disorder: Secondary | ICD-10-CM | POA: Diagnosis not present

## 2019-10-07 DIAGNOSIS — I483 Typical atrial flutter: Secondary | ICD-10-CM | POA: Diagnosis not present

## 2019-10-07 DIAGNOSIS — F331 Major depressive disorder, recurrent, moderate: Secondary | ICD-10-CM | POA: Diagnosis not present

## 2019-10-07 DIAGNOSIS — G4700 Insomnia, unspecified: Secondary | ICD-10-CM | POA: Diagnosis not present

## 2019-10-08 DIAGNOSIS — R42 Dizziness and giddiness: Secondary | ICD-10-CM | POA: Diagnosis not present

## 2019-10-08 DIAGNOSIS — L2389 Allergic contact dermatitis due to other agents: Secondary | ICD-10-CM | POA: Diagnosis not present

## 2019-10-08 DIAGNOSIS — D649 Anemia, unspecified: Secondary | ICD-10-CM | POA: Diagnosis not present

## 2019-10-08 DIAGNOSIS — R682 Dry mouth, unspecified: Secondary | ICD-10-CM | POA: Diagnosis not present

## 2019-10-13 ENCOUNTER — Encounter (INDEPENDENT_AMBULATORY_CARE_PROVIDER_SITE_OTHER): Payer: Self-pay | Admitting: Hospital

## 2019-10-13 ENCOUNTER — Encounter (INDEPENDENT_AMBULATORY_CARE_PROVIDER_SITE_OTHER): Payer: Self-pay

## 2019-10-14 DIAGNOSIS — I483 Typical atrial flutter: Secondary | ICD-10-CM | POA: Diagnosis not present

## 2019-10-14 DIAGNOSIS — R42 Dizziness and giddiness: Secondary | ICD-10-CM | POA: Diagnosis not present

## 2019-10-14 DIAGNOSIS — F331 Major depressive disorder, recurrent, moderate: Secondary | ICD-10-CM | POA: Diagnosis not present

## 2019-10-14 DIAGNOSIS — F411 Generalized anxiety disorder: Secondary | ICD-10-CM | POA: Diagnosis not present

## 2019-10-14 DIAGNOSIS — K219 Gastro-esophageal reflux disease without esophagitis: Secondary | ICD-10-CM | POA: Diagnosis not present

## 2019-10-14 DIAGNOSIS — D519 Vitamin B12 deficiency anemia, unspecified: Secondary | ICD-10-CM | POA: Diagnosis not present

## 2019-10-14 DIAGNOSIS — R682 Dry mouth, unspecified: Secondary | ICD-10-CM | POA: Diagnosis not present

## 2019-10-14 DIAGNOSIS — M25512 Pain in left shoulder: Secondary | ICD-10-CM | POA: Diagnosis not present

## 2019-10-14 DIAGNOSIS — J449 Chronic obstructive pulmonary disease, unspecified: Secondary | ICD-10-CM | POA: Diagnosis not present

## 2019-10-14 DIAGNOSIS — D649 Anemia, unspecified: Secondary | ICD-10-CM | POA: Diagnosis not present

## 2019-10-14 DIAGNOSIS — N3281 Overactive bladder: Secondary | ICD-10-CM | POA: Diagnosis not present

## 2019-10-14 DIAGNOSIS — G4700 Insomnia, unspecified: Secondary | ICD-10-CM | POA: Diagnosis not present

## 2019-10-21 ENCOUNTER — Encounter (INDEPENDENT_AMBULATORY_CARE_PROVIDER_SITE_OTHER): Payer: Self-pay | Admitting: Hospital

## 2019-11-04 ENCOUNTER — Ambulatory Visit: Payer: PPO | Attending: Internal Medicine

## 2019-11-05 ENCOUNTER — Other Ambulatory Visit: Payer: Self-pay

## 2019-11-05 ENCOUNTER — Encounter: Payer: Self-pay | Admitting: Urology

## 2019-11-05 ENCOUNTER — Ambulatory Visit (INDEPENDENT_AMBULATORY_CARE_PROVIDER_SITE_OTHER): Payer: PPO | Admitting: Urology

## 2019-11-05 VITALS — BP 122/78 | HR 79 | Temp 97.3°F | Ht 63.0 in | Wt 123.0 lb

## 2019-11-05 DIAGNOSIS — N3021 Other chronic cystitis with hematuria: Secondary | ICD-10-CM | POA: Diagnosis not present

## 2019-11-05 MED ORDER — FOSFOMYCIN TROMETHAMINE 3 G PO PACK: 3.0000 g | PACK | Freq: Once | ORAL | 1 refills | Status: AC

## 2019-11-05 NOTE — Progress Notes (Signed)
Urological Symptom Review  Patient is experiencing the following symptoms: Get up at night to urinate Leakage of urine   Review of Systems  Gastrointestinal (upper)  : Negative for upper GI symptoms  Gastrointestinal (lower) : Negative for lower GI symptoms  Constitutional : Negative for symptoms  Skin: Negative for skin symptoms  Eyes: Negative for eye symptoms  Ear/Nose/Throat : Negative for Ear/Nose/Throat symptoms  Hematologic/Lymphatic: Negative for Hematologic/Lymphatic symptoms        Cardiovascular : Negative for cardiovascular symptoms  Respiratory : Negative for respiratory symptoms  Endocrine: Negative for endocrine symptoms  Musculoskeletal: Negative for musculoskeletal symptoms  Neurological: Negative for neurological symptoms  Psychologic: Negative for psychiatric symptoms    Pt. Unable to void.

## 2019-11-05 NOTE — Progress Notes (Signed)
11/05/2019 4:24 PM   Sharetta Cheek-Eurich Nov 21, 1930 [id number]  Referring provider: Celene Squibb, MD [address] [place],  [place] [address]  dysuria  HPI: Ms Atilano Median is a [phone] here for followup for urinary incontinence. She is currently on trospium at night and mirabegron 50mg  qhs. She has a positive urine culture which was resistant to the mirabegron. She has intermittent dysuria. She continues to have severe incontinence day and night   PMH: Past Medical History:  Diagnosis Date  . Atrial flutter ([place])   . Chronic UTI   . COPD (chronic obstructive pulmonary disease) ([place])   . Dizziness   . GERD (gastroesophageal reflux disease)   . IBS (irritable bowel syndrome)   . Renal disorder    renal insufficiency    Surgical History: Past Surgical History:  Procedure Laterality Date  . ABDOMINAL HYSTERECTOMY    . 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 JOINT REPLACEMENT      Home Medications:  Allergies as of 11/05/2019      Reactions   Levaquin [levofloxacin In D5w] Itching   Lower extremities   Penicillins Nausea Only   Has patient had a PCN reaction causing immediate rash, facial/tongue/throat swelling, SOB or lightheadedness with hypotension: No Has patient had a PCN reaction causing severe rash involving mucus membranes or skin necrosis: No Has patient had a PCN reaction that required hospitalization: No Has patient had a PCN reaction occurring within the last 10 years: No If all of the above answers are "NO", then may proceed with Cephalosporin use.   Sulfa Antibiotics Hives   Tequin [gatifloxacin] Itching   Lower extremities   Latex Itching, Rash      Medication List       Accurate as of November 05, 2019  4:24 PM. If you have any questions, ask your nurse or doctor.        albuterol 108 (90 Base) MCG/ACT inhaler Commonly known as: VENTOLIN HFA Inhale 1-2 puffs into the lungs every 6 (six) hours as needed for wheezing or shortness of breath.   diazepam 5 MG  tablet Commonly known as: VALIUM Take 5-10 mg by mouth every 6 (six) hours as needed for anxiety.   diltiazem 30 MG tablet Commonly known as: CARDIZEM TAKE 1 TABLET (30 MG TOTAL) BY MOUTH 2 (TWO) TIMES DAILY.   escitalopram 5 MG tablet Commonly known as: LEXAPRO 5 mg daily.   guaiFENesin 600 MG 12 hr tablet Commonly known as: MUCINEX Take 600 mg by mouth 2 (two) times daily.   HIPREX PO Take 0.5 mg by mouth 2 (two) times a day.   HYDROcodone-acetaminophen 5-325 MG tablet Commonly known as: NORCO/VICODIN Take 1 tablet by mouth 2 (two) times daily.   loperamide 2 MG capsule Commonly known as: IMODIUM Take 2 mg by mouth as needed for diarrhea or loose stools.   meclizine 12.5 MG tablet Commonly known as: ANTIVERT Take 12.5 mg by mouth 3 (three) times daily as needed for dizziness.   Myrbetriq 50 MG Tb24 tablet Generic drug: mirabegron ER 50 mg daily.   omeprazole 20 MG capsule Commonly known as: PRILOSEC Take 20 mg by mouth daily.   ondansetron 4 MG tablet Commonly known as: ZOFRAN Take 4 mg by mouth every 6 (six) hours as needed. for nausea   PRESERVISION AREDS PO Take by mouth.   Restasis 0.05 % ophthalmic emulsion Generic drug: cycloSPORINE INSTILL 1 DROP INTO EACH EYE TWICE DAILY   Rivaroxaban 15 MG Tabs tablet  Commonly known as: Xarelto Take 1 tablet (15 mg total) by mouth every morning.   tolterodine 4 MG 24 hr capsule Commonly known as: DETROL LA Take 4 mg by mouth daily.   traZODone 50 MG tablet Commonly known as: DESYREL Take 50 mg by mouth at bedtime.       Allergies:  Allergies  Allergen Reactions  . Levaquin [Levofloxacin In D5w] Itching    Lower extremities  . Penicillins Nausea Only    Has patient had a PCN reaction causing immediate rash, facial/tongue/throat swelling, SOB or lightheadedness with hypotension: No Has patient had a PCN reaction causing severe rash involving mucus membranes or skin necrosis: No Has patient had a PCN  reaction that required hospitalization: No Has patient had a PCN reaction occurring within the last 10 years: No If all of the above answers are "NO", then may proceed with Cephalosporin use.   . Sulfa Antibiotics Hives  . Tequin [Gatifloxacin] Itching    Lower extremities  . Latex Itching and Rash    Family History: Family History  Problem Relation Age of Onset  . Hypertension Mother     Social History:  reports that she has never smoked. She has never used smokeless tobacco. She reports that she does not drink alcohol or use drugs.  ROS: All other review of systems were reviewed and are negative except what is noted above in HPI  Physical Exam: BP 122/78   Pulse 79   Temp (!) 97.3 F (36.3 C)   Ht 5\' 3"  (1.6 m)   Wt 123 lb (55.8 kg)   BMI 21.79 kg/m   Constitutional:  Alert and oriented, No acute distress. HEENT: [place] AT, moist mucus membranes.  Trachea midline, no masses. Cardiovascular: No clubbing, cyanosis, or edema. Respiratory: Normal respiratory effort, no increased work of breathing. GI: Abdomen is soft, nontender, nondistended, no abdominal masses GU: No CVA tenderness Lymph: No cervical or inguinal lymphadenopathy. Skin: No rashes, bruises or suspicious lesions. Neurologic: Grossly intact, no focal deficits, moving all 4 extremities. Psychiatric: Normal mood and affect.  Laboratory Data: Lab Results  Component Value Date   WBC 12.1 (H) 06/30/2018   HGB 10.8 (L) 06/30/2018   HCT 34.6 (L) 06/30/2018   MCV 86.9 06/30/2018   PLT 380 06/30/2018    Lab Results  Component Value Date   CREATININE 1.10 (H) 10/07/2018    No results found for: PSA  No results found for: TESTOSTERONE  No results found for: HGBA1C  Urinalysis    Component Value Date/Time   COLORURINE YELLOW 11/27/2018 1535   APPEARANCEUR TURBID (A) 11/27/2018 1535   LABSPEC 1.017 11/27/2018 1535   PHURINE 5.0 11/27/2018 1535   GLUCOSEU NEGATIVE 11/27/2018 1535   HGBUR NEGATIVE  11/27/2018 1535   BILIRUBINUR neg 09/24/2019 1629   KETONESUR NEGATIVE 11/27/2018 1535   PROTEINUR Positive (A) 09/24/2019 1629   PROTEINUR 100 (A) 11/27/2018 1535   UROBILINOGEN negative (A) 09/24/2019 1629   NITRITE positive 09/24/2019 1629   NITRITE NEGATIVE 11/27/2018 1535   LEUKOCYTESUR Moderate (2+) (A) 09/24/2019 1629   LEUKOCYTESUR MODERATE (A) 11/27/2018 1535    Lab Results  Component Value Date   BACTERIA FEW (A) 11/27/2018    Pertinent Imaging:  No results found for this or any previous visit. No results found for this or any previous visit. No results found for this or any previous visit. No results found for this or any previous visit. No results found for this or any previous visit. No results  found for this or any previous visit. No results found for this or any previous visit. No results found for this or any previous visit.  Assessment & Plan:    1. Chronic cystitis with hematuria fosfomycin 3g x 2 doses. RTC 4-6 weeks   Return in about 4 weeks (around 12/03/2019).  Wilkie Aye, MD  [facility] [place]

## 2019-11-06 ENCOUNTER — Encounter: Payer: Self-pay | Admitting: Urology

## 2019-11-06 NOTE — Patient Instructions (Signed)
Urinary Tract Infection, Adult A urinary tract infection (UTI) is an infection of any part of the urinary tract. The urinary tract includes:  The kidneys.  The ureters.  The bladder.  The urethra. These organs make, store, and get rid of pee (urine) in the body. What are the causes? This is caused by germs (bacteria) in your genital area. These germs grow and cause swelling (inflammation) of your urinary tract. What increases the risk? You are more likely to develop this condition if:  You have a small, thin tube (catheter) to drain pee.  You cannot control when you pee or poop (incontinence).  You are female, and: ? You use these methods to prevent pregnancy:  A medicine that kills sperm (spermicide).  A device that blocks sperm (diaphragm). ? You have low levels of a female hormone (estrogen). ? You are pregnant.  You have genes that add to your risk.  You are sexually active.  You take antibiotic medicines.  You have trouble peeing because of: ? A prostate that is bigger than normal, if you are female. ? A blockage in the part of your body that drains pee from the bladder (urethra). ? A kidney stone. ? A nerve condition that affects your bladder (neurogenic bladder). ? Not getting enough to drink. ? Not peeing often enough.  You have other conditions, such as: ? Diabetes. ? A weak disease-fighting system (immune system). ? Sickle cell disease. ? Gout. ? Injury of the spine. What are the signs or symptoms? Symptoms of this condition include:  Needing to pee right away (urgently).  Peeing often.  Peeing small amounts often.  Pain or burning when peeing.  Blood in the pee.  Pee that smells bad or not like normal.  Trouble peeing.  Pee that is cloudy.  Fluid coming from the vagina, if you are female.  Pain in the belly or lower back. Other symptoms include:  Throwing up (vomiting).  No urge to eat.  Feeling mixed up (confused).  Being tired  and grouchy (irritable).  A fever.  Watery poop (diarrhea). How is this treated? This condition may be treated with:  Antibiotic medicine.  Other medicines.  Drinking enough water. Follow these instructions at home:  Medicines  Take over-the-counter and prescription medicines only as told by your doctor.  If you were prescribed an antibiotic medicine, take it as told by your doctor. Do not stop taking it even if you start to feel better. General instructions  Make sure you: ? Pee until your bladder is empty. ? Do not hold pee for a long time. ? Empty your bladder after sex. ? Wipe from front to back after pooping if you are a female. Use each tissue one time when you wipe.  Drink enough fluid to keep your pee pale yellow.  Keep all follow-up visits as told by your doctor. This is important. Contact a doctor if:  You do not get better after 1-2 days.  Your symptoms go away and then come back. Get help right away if:  You have very bad back pain.  You have very bad pain in your lower belly.  You have a fever.  You are sick to your stomach (nauseous).  You are throwing up. Summary  A urinary tract infection (UTI) is an infection of any part of the urinary tract.  This condition is caused by germs in your genital area.  There are many risk factors for a UTI. These include having a small, thin   tube to drain pee and not being able to control when you pee or poop.  Treatment includes antibiotic medicines for germs.  Drink enough fluid to keep your pee pale yellow. This information is not intended to replace advice given to you by your health care provider. Make sure you discuss any questions you have with your health care provider. Document Revised: 08/08/2018 Document Reviewed: 02/28/2018 Elsevier Patient Education  2020 [employer] Inc[employer]  

## 2019-11-25 DIAGNOSIS — F411 Generalized anxiety disorder: Secondary | ICD-10-CM | POA: Diagnosis not present

## 2019-11-25 DIAGNOSIS — K219 Gastro-esophageal reflux disease without esophagitis: Secondary | ICD-10-CM | POA: Diagnosis not present

## 2019-11-25 DIAGNOSIS — R42 Dizziness and giddiness: Secondary | ICD-10-CM | POA: Diagnosis not present

## 2019-11-25 DIAGNOSIS — F331 Major depressive disorder, recurrent, moderate: Secondary | ICD-10-CM | POA: Diagnosis not present

## 2019-11-25 DIAGNOSIS — N3281 Overactive bladder: Secondary | ICD-10-CM | POA: Diagnosis not present

## 2019-11-25 DIAGNOSIS — R682 Dry mouth, unspecified: Secondary | ICD-10-CM | POA: Diagnosis not present

## 2019-11-25 DIAGNOSIS — G4700 Insomnia, unspecified: Secondary | ICD-10-CM | POA: Diagnosis not present

## 2019-11-25 DIAGNOSIS — M25512 Pain in left shoulder: Secondary | ICD-10-CM | POA: Diagnosis not present

## 2019-11-25 DIAGNOSIS — I483 Typical atrial flutter: Secondary | ICD-10-CM | POA: Diagnosis not present

## 2019-11-25 DIAGNOSIS — D649 Anemia, unspecified: Secondary | ICD-10-CM | POA: Diagnosis not present

## 2019-11-25 DIAGNOSIS — J449 Chronic obstructive pulmonary disease, unspecified: Secondary | ICD-10-CM | POA: Diagnosis not present

## 2019-12-08 ENCOUNTER — Encounter: Payer: Self-pay | Admitting: Urology

## 2019-12-08 ENCOUNTER — Other Ambulatory Visit: Payer: Self-pay

## 2019-12-08 ENCOUNTER — Ambulatory Visit: Payer: PPO | Admitting: Urology

## 2019-12-08 ENCOUNTER — Ambulatory Visit (INDEPENDENT_AMBULATORY_CARE_PROVIDER_SITE_OTHER): Payer: PPO | Admitting: Urology

## 2019-12-08 VITALS — BP 122/72 | HR 94 | Temp 97.7°F | Ht 64.0 in | Wt 130.0 lb

## 2019-12-08 DIAGNOSIS — N3021 Other chronic cystitis with hematuria: Secondary | ICD-10-CM

## 2019-12-08 DIAGNOSIS — N3281 Overactive bladder: Secondary | ICD-10-CM

## 2019-12-08 MED ORDER — NITROFURANTOIN MACROCRYSTAL 50 MG PO CAPS: 50.0000 mg | ORAL_CAPSULE | Freq: Every evening | ORAL | 3 refills | Status: DC

## 2019-12-08 NOTE — Progress Notes (Signed)

## 2019-12-08 NOTE — Progress Notes (Signed)
12/08/2019 3:46 PM   Diana Mata 1931/04/13 [id number]  Referring provider: Benita Stabile, MD [address] [place],  [place] [address]  Chronic cystitis  HPI: [name] is a 86yo here for followup for chronic cystitis and OAB. She is on trospium at night and mirabegron in AM. She has better urinary control during the day after completing course of fosfomycin. No dysuria or hematuria. She could not give a urine specimen today. She states she feels better on her current therapy. Her biggest complaint today is nocturnal enuresis.  She is doing 2hr timed voiding.    PMH: Past Medical History:  Diagnosis Date  . Atrial flutter (HCC)   . Chronic UTI   . COPD (chronic obstructive pulmonary disease) (HCC)   . Dizziness   . GERD (gastroesophageal reflux disease)   . IBS (irritable bowel syndrome)   . Renal disorder    renal insufficiency    Surgical History: Past Surgical History:  Procedure Laterality Date  . ABDOMINAL HYSTERECTOMY    . 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 JOINT REPLACEMENT      Home Medications:  Allergies as of 12/08/2019      Reactions   Levaquin [levofloxacin In D5w] Itching   Lower extremities   Penicillins Nausea Only   Has patient had a PCN reaction causing immediate rash, facial/tongue/throat swelling, SOB or lightheadedness with hypotension: No Has patient had a PCN reaction causing severe rash involving mucus membranes or skin necrosis: No Has patient had a PCN reaction that required hospitalization: No Has patient had a PCN reaction occurring within the last 10 years: No If all of the above answers are "NO", then may proceed with Cephalosporin use.   Sulfa Antibiotics Hives   Tequin [gatifloxacin] Itching   Lower extremities   Latex Itching, Rash      Medication List       Accurate as of December 08, 2019  3:46 PM. If you have any questions, ask your nurse or doctor.        albuterol 108 (90 Base) MCG/ACT inhaler Commonly known as:  VENTOLIN HFA Inhale 1-2 puffs into the lungs every 6 (six) hours as needed for wheezing or shortness of breath.   diazepam 5 MG tablet Commonly known as: VALIUM Take 5-10 mg by mouth every 6 (six) hours as needed for anxiety.   diltiazem 30 MG tablet Commonly known as: CARDIZEM TAKE 1 TABLET (30 MG TOTAL) BY MOUTH 2 (TWO) TIMES DAILY.   escitalopram 5 MG tablet Commonly known as: LEXAPRO 5 mg daily.   fluticasone 50 MCG/ACT nasal spray Commonly known as: FLONASE Place 1 spray into both nostrils daily.   guaiFENesin 600 MG 12 hr tablet Commonly known as: MUCINEX Take 600 mg by mouth 2 (two) times daily.   HIPREX PO Take 0.5 mg by mouth 2 (two) times a day.   HYDROcodone-acetaminophen 5-325 MG tablet Commonly known as: NORCO/VICODIN Take 1 tablet by mouth 2 (two) times daily.   loperamide 2 MG capsule Commonly known as: IMODIUM Take 2 mg by mouth as needed for diarrhea or loose stools.   meclizine 12.5 MG tablet Commonly known as: ANTIVERT Take 12.5 mg by mouth 3 (three) times daily as needed for dizziness.   meclizine 25 MG tablet Commonly known as: ANTIVERT Take 25 mg by mouth every morning.   Myrbetriq 50 MG Tb24 tablet Generic drug: mirabegron ER 50 mg daily.   nitrofurantoin 50 MG capsule Commonly known as: MACRODANTIN Take  50 mg by mouth at bedtime.   omeprazole 20 MG capsule Commonly known as: PRILOSEC Take 20 mg by mouth daily.   ondansetron 4 MG tablet Commonly known as: ZOFRAN Take 4 mg by mouth every 6 (six) hours as needed. for nausea   PRESERVISION AREDS PO Take by mouth.   Restasis 0.05 % ophthalmic emulsion Generic drug: cycloSPORINE INSTILL 1 DROP INTO EACH EYE TWICE DAILY   Rivaroxaban 15 MG Tabs tablet Commonly known as: Xarelto Take 1 tablet (15 mg total) by mouth every morning.   tolterodine 4 MG 24 hr capsule Commonly known as: DETROL LA Take 4 mg by mouth daily.   traZODone 50 MG tablet Commonly known as: DESYREL Take 50  mg by mouth at bedtime.   Trospium Chloride 60 MG Cp24 Take 1 capsule by mouth at bedtime.       Allergies:  Allergies  Allergen Reactions  . Levaquin [Levofloxacin In D5w] Itching    Lower extremities  . Penicillins Nausea Only    Has patient had a PCN reaction causing immediate rash, facial/tongue/throat swelling, SOB or lightheadedness with hypotension: No Has patient had a PCN reaction causing severe rash involving mucus membranes or skin necrosis: No Has patient had a PCN reaction that required hospitalization: No Has patient had a PCN reaction occurring within the last 10 years: No If all of the above answers are "NO", then may proceed with Cephalosporin use.   . Sulfa Antibiotics Hives  . Tequin [Gatifloxacin] Itching    Lower extremities  . Latex Itching and Rash    Family History: Family History  Problem Relation Age of Onset  . Hypertension Mother     Social History:  reports that she has never smoked. She has never used smokeless tobacco. She reports that she does not drink alcohol or use drugs.  ROS: All other review of systems were reviewed and are negative except what is noted above in HPI  Physical Exam: BP 122/72   Pulse 94   Temp 97.7 F (36.5 C)   Ht 5\' 4"  (1.626 m)   Wt 130 lb (59 kg)   BMI 22.31 kg/m   Constitutional:  Alert and oriented, No acute distress. HEENT: [place] AT, moist mucus membranes.  Trachea midline, no masses. Cardiovascular: No clubbing, cyanosis, or edema. Respiratory: Normal respiratory effort, no increased work of breathing. GI: Abdomen is soft, nontender, nondistended, no abdominal masses GU: No CVA tenderness Lymph: No cervical or inguinal lymphadenopathy. Skin: No rashes, bruises or suspicious lesions. Neurologic: Grossly intact, no focal deficits, moving all 4 extremities. Psychiatric: Normal mood and affect.  Laboratory Data: Lab Results  Component Value Date   WBC 12.1 (H) 06/30/2018   HGB 10.8 (L) 06/30/2018    HCT 34.6 (L) 06/30/2018   MCV 86.9 06/30/2018   PLT 380 06/30/2018    Lab Results  Component Value Date   CREATININE 1.10 (H) 10/07/2018    No results found for: PSA  No results found for: TESTOSTERONE  No results found for: HGBA1C  Urinalysis    Component Value Date/Time   COLORURINE YELLOW 11/27/2018 1535   APPEARANCEUR TURBID (A) 11/27/2018 1535   LABSPEC 1.017 11/27/2018 1535   PHURINE 5.0 11/27/2018 1535   GLUCOSEU NEGATIVE 11/27/2018 1535   HGBUR NEGATIVE 11/27/2018 1535   BILIRUBINUR neg 09/24/2019 1629   KETONESUR NEGATIVE 11/27/2018 1535   PROTEINUR Positive (A) 09/24/2019 1629   PROTEINUR 100 (A) 11/27/2018 1535   UROBILINOGEN negative (A) 09/24/2019 1629   NITRITE positive  09/24/2019 1629   NITRITE NEGATIVE 11/27/2018 1535   LEUKOCYTESUR Moderate (2+) (A) 09/24/2019 1629   LEUKOCYTESUR MODERATE (A) 11/27/2018 1535    Lab Results  Component Value Date   BACTERIA FEW (A) 11/27/2018    Pertinent Imaging:  No results found for this or any previous visit. No results found for this or any previous visit. No results found for this or any previous visit. No results found for this or any previous visit. No results found for this or any previous visit. No results found for this or any previous visit. No results found for this or any previous visit. No results found for this or any previous visit.  Assessment & Plan:    1. Chronic cystitis with hematuria -Continue nitrofurintoin.  - POCT urinalysis dipstick  2. OAB (overactive bladder) -We discussed PTNS versus intravesical botox and the patient is undecided. She will continue mirabegron in AM and trospium in PM.    No follow-ups on file.  Nicolette Bang, MD  [facility]

## 2019-12-08 NOTE — Patient Instructions (Addendum)
Overactive Bladder, Adult  Overactive bladder refers to a condition in which a person has a sudden need to pass urine. The person may leak urine if he or she cannot get to the bathroom fast enough (urinary incontinence). A person with this condition may also wake up several times in the night to go to the bathroom. Overactive bladder is associated with poor nerve signals between your bladder and your brain. Your bladder may get the signal to empty before it is full. You may also have very sensitive muscles that make your bladder squeeze too soon. These symptoms might interfere with daily work or social activities. What are the causes? This condition may be associated with or caused by:  Urinary tract infection.  Infection of nearby tissues, such as the prostate.  Prostate enlargement.  Surgery on the uterus or urethra.  Bladder stones, inflammation, or tumors.  Drinking too much caffeine or alcohol.  Certain medicines, especially medicines that get rid of extra fluid in the body (diuretics).  Muscle or nerve weakness, especially from: ? A spinal cord injury. ? Stroke. ? Multiple sclerosis. ? Parkinson's disease.  Diabetes.  Constipation. What increases the risk? You may be at greater risk for overactive bladder if you:  Are an older adult.  Smoke.  Are going through menopause.  Have prostate problems.  Have a neurological disease, such as stroke, dementia, Parkinson's disease, or multiple sclerosis (MS).  Eat or drink things that irritate the bladder. These include alcohol, spicy food, and caffeine.  Are overweight or obese. What are the signs or symptoms? Symptoms of this condition include:  Sudden, strong urge to urinate.  Leaking urine.  Urinating 8 or more times a day.  Waking up to urinate 2 or more times a night. How is this diagnosed? Your health care provider may suspect overactive bladder based on your symptoms. He or she will diagnose this condition  by:  A physical exam and medical history.  Blood or urine tests. You might need bladder or urine tests to help determine what is causing your overactive bladder. You might also need to see a health care provider who specializes in urinary tract problems (urologist). How is this treated? Treatment for overactive bladder depends on the cause of your condition and whether it is mild or severe. You can also make lifestyle changes at home. Options include:  Bladder training. This may include: ? Learning to control the urge to urinate by following a schedule that directs you to urinate at regular intervals (timed voiding). ? Doing Kegel exercises to strengthen your pelvic floor muscles, which support your bladder. Toning these muscles can help you control urination, even if your bladder muscles are overactive.  Special devices. This may include: ? Biofeedback, which uses sensors to help you become aware of your body's signals. ? Electrical stimulation, which uses electrodes placed inside the body (implanted) or outside the body. These electrodes send gentle pulses of electricity to strengthen the nerves or muscles that control the bladder. ? Women may use a plastic device that fits into the vagina and supports the bladder (pessary).  Medicines. ? Antibiotics to treat bladder infection. ? Antispasmodics to stop the bladder from releasing urine at the wrong time. ? Tricyclic antidepressants to relax bladder muscles. ? Injections of botulinum toxin type A directly into the bladder tissue to relax bladder muscles.  Lifestyle changes. This may include: ? Weight loss. Talk to your health care provider about weight loss methods that would work best for you. ?   Diet changes. This may include reducing how much alcohol and caffeine you consume, or drinking fluids at different times of the day. ? Not smoking. Do not use any products that contain nicotine or tobacco, such as cigarettes and e-cigarettes. If  you need help quitting, ask your health care provider.  Surgery. ? A device may be implanted to help manage the nerve signals that control urination. ? An electrode may be implanted to stimulate electrical signals in the bladder. ? A procedure may be done to change the shape of the bladder. This is done only in very severe cases. Follow these instructions at home: Lifestyle  Make any diet or lifestyle changes that are recommended by your health care provider. These may include: ? Drinking less fluid or drinking fluids at different times of the day. ? Cutting down on caffeine or alcohol. ? Doing Kegel exercises. ? Losing weight if needed. ? Eating a healthy and balanced diet to prevent constipation. This may include:  Eating foods that are high in fiber, such as fresh fruits and vegetables, whole grains, and beans.  Limiting foods that are high in fat and processed sugars, such as fried and sweet foods. General instructions  Take over-the-counter and prescription medicines only as told by your health care provider.  If you were prescribed an antibiotic medicine, take it as told by your health care provider. Do not stop taking the antibiotic even if you start to feel better.  Use any implants or pessary as told by your health care provider.  If needed, wear pads to absorb urine leakage.  Keep a journal or log to track how much and when you drink and when you feel the need to urinate. This will help your health care provider monitor your condition.  Keep all follow-up visits as told by your health care provider. This is important. Contact a health care provider if:  You have a fever.  Your symptoms do not get better with treatment.  Your pain and discomfort get worse.  You have more frequent urges to urinate. Get help right away if:  You are not able to control your bladder. Summary  Overactive bladder refers to a condition in which a person has a sudden need to pass  urine.  Several conditions may lead to an overactive bladder.  Treatment for overactive bladder depends on the cause and severity of your condition.  Follow your health care provider's instructions about lifestyle changes, doing Kegel exercises, keeping a journal, and taking medicines. This information is not intended to replace advice given to you by your health care provider. Make sure you discuss any questions you have with your health care provider. Document Revised: 12/12/2018 Document Reviewed: 09/06/2017 Elsevier Patient Education  [place].  Botulinum Toxin Bladder Injection  A botulinum toxin bladder injection is a procedure to treat an overactive bladder. During the procedure, a drug called botulinum toxin is injected into the bladder through a long, thin needle. This drug relaxes the bladder muscles and reduces overactivity. You may need this procedure if your medicines are not working or you cannot take them. The procedure may be repeated as needed. The treatment usually lasts for 6 months. Your health care provider will monitor you to see how well you respond. Tell a health care provider about:  Any allergies you have.  All medicines you are taking, including vitamins, herbs, eye drops, creams, and over-the-counter medicines.  Any problems you or family members have had with anesthetic medicines.  Any  blood disorders you have.  Any surgeries you have had.  Any medical conditions you have.  Any previous reactions to a botulinum toxin injection.  Any symptoms of urinary tract infection. These include chills, fever, a burning feeling when passing urine, and needing to pass urine often.  Whether you are pregnant or may be pregnant. What are the risks? Generally this is a safe procedure. However, problems may occur, including:  Not being able to pass urine. If this happens, you may need to have your bladder emptied with a thin tube inserted into your urethra  (urinary catheter).  Bleeding.  Urinary tract infection.  Allergic reaction to the botulinum toxin.  Pain or burning when passing urine.  Damage to other structures or organs. What happens before the procedure? Staying hydrated Follow instructions from your health care provider about hydration, which may include:  Up to 2 hours before the procedure - you may continue to drink clear liquids, such as water, clear fruit juice, black coffee, and plain tea. Eating and drinking restrictions Follow instructions from your health care provider about eating and drinking, which may include:  8 hours before the procedure - stop eating heavy meals or foods, such as meat, fried foods, or fatty foods.  6 hours before the procedure - stop eating light meals or foods, such as toast or cereal.  6 hours before the procedure - stop drinking milk or drinks that contain milk.  2 hours before the procedure - stop drinking clear liquids. Medicines Ask your health care provider about:  Changing or stopping your regular medicines. This is especially important if you are taking diabetes medicines or blood thinners.  Taking medicines such as aspirin and ibuprofen. These medicines can thin your blood. Do not take these medicines unless your health care provider tells you to take them.  Taking over-the-counter medicines, vitamins, herbs, and supplements. General instructions  Plan to have someone take you home from the hospital or clinic.  If you will be going home right after the procedure, plan to have someone with you for 24 hours.  Ask your health care provider what steps will be taken to help prevent infection. These may include: ? Removing hair at the procedure site. ? Washing skin with a germ-killing soap. ? Antibiotic medicine. What happens during the procedure?   You will be asked to empty your bladder.  An IV will be inserted into one of your veins.  You will be given one or more of  the following: ? A medicine to help you relax (sedative). ? A medicine to numb the area (local anesthetic). ? A medicine to make you fall asleep (general anesthetic).  A long, thin scope called a cystoscope will be passed into your bladder through the part of the body that carries urine from your bladder (urethra).  The cystoscope will be used to fill your bladder with water.  A long needle will be passed through the cystoscope and into the bladder.  The botulinum toxin will be injected into your bladder. It may be injected into multiple areas of your bladder.  Your bladder will be emptied, and the cystoscope will be removed. The procedure may vary among health care providers and hospitals. What can I expect after procedure? After your procedure, it is common to have:  Blood-tinged urine.  Burning or soreness when you pass urine. Follow these instructions at home: Medicines  Take over-the-counter and prescription medicines only as told by your health care provider.  If you were  prescribed an antibiotic medicine, take it as told by your health care provider. Do not stop taking the antibiotic even if you start to feel better. General instructions   Do not drive for 24 hours if you were given a sedative during your procedure.  Drink enough fluid to keep your urine pale yellow.  Return to your normal activities as told by your health care provider. Ask your health care provider what activities are safe for you.  Keep all follow-up visits as told by your health care provider. This is important. Contact a health care provider if you have:  A fever or chills.  Blood-tinged urine for more than one day after your procedure.  Worsening pain or burning when you pass urine.  Pain or burning when passing urine for more than two days after your procedure.  Trouble emptying your bladder. Get help right away if you:  Have bright red blood in your urine.  Are unable to pass  urine. Summary  A botulinum toxin bladder injection is a procedure to treat an overactive bladder.  This is generally a very safe procedure. However, problems may occur, including not being able to pass urine, bleeding, infection, pain, and allergic reactions to medicines.  You will be told when to stop eating and drinking, and what medicines to change or stop. Follow instructions carefully.  After the procedure, it is common to have blood in urine and to have soreness or burning when passing urine.  Contact a health care provider if you have a fever, have blood in urine for more than a few days, or have trouble passing urine. Get help right away if you have bright red blood in the urine, or if you are unable to pass urine. This information is not intended to replace advice given to you by your health care provider. Make sure you discuss any questions you have with your health care provider. Document Revised: 03/01/2018 Document Reviewed: 03/01/2018 Elsevier Patient Education  2020 [employer].

## 2019-12-10 ENCOUNTER — Ambulatory Visit: Payer: PPO | Admitting: Urology

## 2019-12-17 DIAGNOSIS — F411 Generalized anxiety disorder: Secondary | ICD-10-CM | POA: Diagnosis not present

## 2019-12-17 DIAGNOSIS — J449 Chronic obstructive pulmonary disease, unspecified: Secondary | ICD-10-CM | POA: Diagnosis not present

## 2019-12-17 DIAGNOSIS — I483 Typical atrial flutter: Secondary | ICD-10-CM | POA: Diagnosis not present

## 2019-12-17 DIAGNOSIS — D649 Anemia, unspecified: Secondary | ICD-10-CM | POA: Diagnosis not present

## 2019-12-17 DIAGNOSIS — R42 Dizziness and giddiness: Secondary | ICD-10-CM | POA: Diagnosis not present

## 2019-12-17 DIAGNOSIS — G4700 Insomnia, unspecified: Secondary | ICD-10-CM | POA: Diagnosis not present

## 2019-12-17 DIAGNOSIS — K219 Gastro-esophageal reflux disease without esophagitis: Secondary | ICD-10-CM | POA: Diagnosis not present

## 2019-12-17 DIAGNOSIS — N3281 Overactive bladder: Secondary | ICD-10-CM | POA: Diagnosis not present

## 2019-12-17 DIAGNOSIS — R682 Dry mouth, unspecified: Secondary | ICD-10-CM | POA: Diagnosis not present

## 2019-12-17 DIAGNOSIS — M25512 Pain in left shoulder: Secondary | ICD-10-CM | POA: Diagnosis not present

## 2019-12-17 DIAGNOSIS — F331 Major depressive disorder, recurrent, moderate: Secondary | ICD-10-CM | POA: Diagnosis not present

## 2019-12-19 DIAGNOSIS — D519 Vitamin B12 deficiency anemia, unspecified: Secondary | ICD-10-CM | POA: Diagnosis not present

## 2019-12-30 DIAGNOSIS — H04123 Dry eye syndrome of bilateral lacrimal glands: Secondary | ICD-10-CM | POA: Diagnosis not present

## 2019-12-30 DIAGNOSIS — H5203 Hypermetropia, bilateral: Secondary | ICD-10-CM | POA: Diagnosis not present

## 2019-12-30 DIAGNOSIS — Z961 Presence of intraocular lens: Secondary | ICD-10-CM | POA: Diagnosis not present

## 2020-01-27 ENCOUNTER — Other Ambulatory Visit: Payer: Self-pay

## 2020-01-27 ENCOUNTER — Encounter: Payer: Self-pay | Admitting: Cardiovascular Disease

## 2020-01-27 ENCOUNTER — Ambulatory Visit: Payer: PPO | Admitting: Cardiovascular Disease

## 2020-01-27 VITALS — BP 100/50 | HR 88 | Ht 64.0 in | Wt 123.0 lb

## 2020-01-27 DIAGNOSIS — I447 Left bundle-branch block, unspecified: Secondary | ICD-10-CM | POA: Diagnosis not present

## 2020-01-27 DIAGNOSIS — Z7901 Long term (current) use of anticoagulants: Secondary | ICD-10-CM

## 2020-01-27 DIAGNOSIS — I4892 Unspecified atrial flutter: Secondary | ICD-10-CM | POA: Diagnosis not present

## 2020-01-27 DIAGNOSIS — I351 Nonrheumatic aortic (valve) insufficiency: Secondary | ICD-10-CM | POA: Diagnosis not present

## 2020-01-27 NOTE — Patient Instructions (Signed)
Medication Instructions:  Your physician recommends that you continue on your current medications as directed. Please refer to the Current Medication list given to you today.  *If you need a refill on your cardiac medications before your next appointment, please call your pharmacy*   Lab Work: None today If you have labs (blood work) drawn today and your tests are completely normal, you will receive your results only by: . MyChart Message (if you have MyChart) OR . A paper copy in the mail If you have any lab test that is abnormal or we need to change your treatment, we will call you to review the results.   Testing/Procedures: None today   Follow-Up: At [facility], you and your health needs are our priority.  As part of our continuing mission to provide you with exceptional heart care, we have created designated Provider Care Teams.  These Care Teams include your primary Cardiologist (physician) and Advanced Practice Providers (APPs -  Physician Assistants and Nurse Practitioners) who all work together to provide you with the care you need, when you need it.  We recommend signing up for the patient portal called "MyChart".  Sign up information is provided on this After Visit Summary.  MyChart is used to connect with patients for Virtual Visits (Telemedicine).  Patients are able to view lab/test results, encounter notes, upcoming appointments, etc.  Non-urgent messages can be sent to your provider as well.   To learn more about what you can do with MyChart, go to [email or web address].    Your next appointment:   6 month(s)  The format for your next appointment:   In Person  Provider:   Suresh Koneswaran, MD   Other Instructions None      Thank you for choosing [facility] !         

## 2020-01-27 NOTE — Progress Notes (Signed)
SUBJECTIVE: The patient presents to establish care with me.  She is an 86 year old female with a past medical history of paroxysmal atrial flutter, hypertension, chronic cystitis, irritable bowel syndrome, COPD, and chronic kidney disease stage III.  She was hospitalized in October 2019 after suffering a fall.  At that time she was found to have a left bundle branch block of unknown chronicity.  She was reportedly dehydrated and had acute on chronic renal insufficiency which improved with IV fluids.  Echocardiogram obtained at that time demonstrated normal LV systolic function, EF 60 to 65%, mild LVH, normal regional wall motion, and mild aortic regurgitation.  Prior to this she had been seeing a cardiologist in [facility] and their notes reported history of paroxysmal atrial flutter diagnosed in March 2017 at which time she was on Xarelto for anticoagulation and Cardizem 30 mg twice daily for rate control.  Left bundle branch block was noted at that time.  At that time she had mild to moderate aortic regurgitation by previous echocardiogram in March 2017.  Carotid Dopplers on 02/19/2019 demonstrated 1 to 39% bilateral ICA stenosis.  She is here with her daughter, Therisa Doyne, who works with [employer] at [facility].  She went to church for the first time in a year this past Sunday.  She did not feel like getting out of bed and when she did she felt some tingling on the top of her head.  She said she just did not feel right.  She denies chest pain, palpitations, leg swelling, and shortness of breath.  She denies having had a fever.  She said she is always cold.  She is on Xarelto.  She has felt well today and was able to empty the dishwasher.  I personally reviewed ECG performed today which demonstrates sinus rhythm with a left bundle branch block.      Review of Systems: As per "subjective", otherwise negative.  Allergies  Allergen Reactions  . Levaquin  [Levofloxacin In D5w] Itching    Lower extremities  . Penicillins Nausea Only    Has patient had a PCN reaction causing immediate rash, facial/tongue/throat swelling, SOB or lightheadedness with hypotension: No Has patient had a PCN reaction causing severe rash involving mucus membranes or skin necrosis: No Has patient had a PCN reaction that required hospitalization: No Has patient had a PCN reaction occurring within the last 10 years: No If all of the above answers are "NO", then may proceed with Cephalosporin use.   . Sulfa Antibiotics Hives  . Tequin [Gatifloxacin] Itching    Lower extremities  . Latex Itching and Rash    Current Outpatient Medications  Medication Sig Dispense Refill  . albuterol (PROVENTIL HFA;VENTOLIN HFA) 108 (90 Base) MCG/ACT inhaler Inhale 1-2 puffs into the lungs every 6 (six) hours as needed for wheezing or shortness of breath.    . diazepam (VALIUM) 5 MG tablet Take 5-10 mg by mouth every 6 (six) hours as needed for anxiety.    Marland Kitchen diltiazem (CARDIZEM) 30 MG tablet TAKE 1 TABLET (30 MG TOTAL) BY MOUTH 2 (TWO) TIMES DAILY. 180 tablet 2  . escitalopram (LEXAPRO) 5 MG tablet 5 mg daily.    . fluticasone (FLONASE) 50 MCG/ACT nasal spray Place 1 spray into both nostrils daily.    Marland Kitchen guaiFENesin (MUCINEX) 600 MG 12 hr tablet Take 600 mg by mouth 2 (two) times daily.     Marland Kitchen HYDROcodone-acetaminophen (NORCO/VICODIN) 5-325 MG tablet Take 1 tablet by  mouth 2 (two) times daily.    Marland Kitchen loperamide (IMODIUM) 2 MG capsule Take 2 mg by mouth as needed for diarrhea or loose stools.    . meclizine (ANTIVERT) 25 MG tablet Take 25 mg by mouth every morning.    . Multiple Vitamins-Minerals (PRESERVISION AREDS PO) Take by mouth.    Marland Kitchen MYRBETRIQ 50 MG TB24 tablet 50 mg daily.    . nitrofurantoin (MACRODANTIN) 50 MG capsule Take 1 capsule (50 mg total) by mouth at bedtime. 90 capsule 3  . omeprazole (PRILOSEC) 20 MG capsule Take 20 mg by mouth daily.    . ondansetron (ZOFRAN) 4 MG tablet  Take 4 mg by mouth every 6 (six) hours as needed. for nausea    . RESTASIS 0.05 % ophthalmic emulsion INSTILL 1 DROP INTO EACH EYE TWICE DAILY    . Rivaroxaban (XARELTO) 15 MG TABS tablet Take 1 tablet (15 mg total) by mouth every morning. 30 tablet 11  . traZODone (DESYREL) 50 MG tablet Take 50 mg by mouth at bedtime.    . Trospium Chloride 60 MG CP24 Take 1 capsule by mouth at bedtime.     No current facility-administered medications for this visit.    Past Medical History:  Diagnosis Date  . Atrial flutter ([place])   . Chronic UTI   . COPD (chronic obstructive pulmonary disease) ([place])   . Dizziness   . GERD (gastroesophageal reflux disease)   . IBS (irritable bowel syndrome)   . Renal disorder    renal insufficiency    Past Surgical History:  Procedure Laterality Date  . ABDOMINAL HYSTERECTOMY    . 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 JOINT REPLACEMENT      Social History   Socioeconomic History  . Marital status: Widowed    Spouse name: Not on file  . Number of children: 3  . Years of education: Not on file  . Highest education level: Bachelor's degree (e.g., BA, AB, BS)  Occupational History  . Occupation: Retired  Tobacco Use  . Smoking status: Never Smoker  . Smokeless tobacco: Never Used  Substance and Sexual Activity  . Alcohol use: Never    Comment: occ  . Drug use: Never  . Sexual activity: Not on file  Other Topics Concern  . Not on file  Social History Narrative   Patient moved to [place] from [place] in August 2019, to be near family   02/03/19 lives alone , 1 mile from daughter, Claiborne Billings   Social Determinants of Health   Financial Resource Strain:   . Difficulty of Paying Living Expenses:   Food Insecurity:   . Worried About Charity fundraiser in the Last Year:   . Arboriculturist in the Last Year:   Transportation Needs:   . Film/video editor (Medical):   Marland Kitchen Lack of Transportation (Non-Medical):   Physical Activity:   . Days of Exercise per  Week:   . Minutes of Exercise per Session:   Stress:   . Feeling of Stress :   Social Connections:   . Frequency of Communication with Friends and Family:   . Frequency of Social Gatherings with Friends and Family:   . Attends Religious Services:   . Active Member of Clubs or Organizations:   . Attends Archivist Meetings:   Marland Kitchen Marital Status:   Intimate Partner Violence:   . Fear of Current or Ex-Partner:   . Emotionally Abused:   Marland Kitchen Physically Abused:   .  Sexually Abused:     Marlyn Corporal, RN was present throughout the entirety of the encounter.  Vitals:   01/27/20 1519  BP: (!) 100/50  Pulse: 88  SpO2: 98%  Weight: 123 lb (55.8 kg)  Height: 5\' 4"  (1.626 m)    Wt Readings from Last 3 Encounters:  01/27/20 123 lb (55.8 kg)  12/08/19 130 lb (59 kg)  11/05/19 123 lb (55.8 kg)     PHYSICAL EXAM General: NAD HEENT: Normal. Neck: No JVD, no thyromegaly. Lungs: Clear to auscultation bilaterally with normal respiratory effort. CV: Regular rate and rhythm, normal S1/paradoxically split S2, no S3/S4, no murmur. No pretibial or periankle edema.  No carotid bruit.   Abdomen: Soft, nontender, no distention.  Neurologic: Alert and oriented.  Psych: Normal affect. Skin: Normal. Musculoskeletal: No gross deformities.      Labs: Lab Results  Component Value Date/Time   K 3.8 06/30/2018 06:50 PM   BUN 20 06/30/2018 06:50 PM   CREATININE 1.10 (H) 10/07/2018 04:23 PM   ALT 12 06/30/2018 06:50 PM   HGB 10.8 (L) 06/30/2018 06:50 PM     Lipids: No results found for: LDLCALC, LDLDIRECT, CHOL, TRIG, HDL     Echocardiogram: 06/2018 Study Conclusions  - Left ventricle: The cavity size was normal. Wall thickness was increased in a pattern of mild LVH. Systolic function was normal. The estimated ejection fraction was in the range of 60% to 65%. Wall motion was normal; there were no regional wall motion abnormalities. Left ventricular diastolic  function parameters were normal. - Aortic valve: Mildly calcified annulus. Trileaflet; normal thickness leaflets. There was mild regurgitation. Valve area (VTI): 2.38 cm^2. Valve area (Vmax): 2.1 cm^2. - Mitral valve: There was mild regurgitation. - Atrial septum: No defect or patent foramen ovale was identified. - Pulmonary arteries: PA peak pressure: 36 mm Hg (S). - Technically adequate study.   ASSESSMENT AND PLAN:  1.  Paroxysmal atrial flutter/use of long-term anticoagulation: Currently on diltiazem 30 mg twice daily.  She denies any evidence of active bleeding.  Symptomatically stable.  Continue Xarelto 15 mg daily.  2.  Aortic regurgitation: Mild in severity by most recent echocardiogram in October 2019.  No obvious murmurs on physical exam today.  Continue to follow.  3.  Left bundle branch block: This is chronic and noted on prior EKGs from her cardiologist in [facility].  She denies anginal symptoms.   Disposition: Follow up 6 months   [facility], M.D., F.A.C.C.

## 2020-02-26 DIAGNOSIS — D519 Vitamin B12 deficiency anemia, unspecified: Secondary | ICD-10-CM | POA: Diagnosis not present

## 2020-02-26 DIAGNOSIS — L603 Nail dystrophy: Secondary | ICD-10-CM | POA: Diagnosis not present

## 2020-03-01 ENCOUNTER — Other Ambulatory Visit: Payer: Self-pay | Admitting: Urology

## 2020-03-01 DIAGNOSIS — N3021 Other chronic cystitis with hematuria: Secondary | ICD-10-CM

## 2020-03-10 ENCOUNTER — Ambulatory Visit: Payer: PPO | Admitting: Urology

## 2020-03-11 DIAGNOSIS — D519 Vitamin B12 deficiency anemia, unspecified: Secondary | ICD-10-CM | POA: Diagnosis not present

## 2020-03-18 DIAGNOSIS — D519 Vitamin B12 deficiency anemia, unspecified: Secondary | ICD-10-CM | POA: Diagnosis not present

## 2020-03-18 DIAGNOSIS — J449 Chronic obstructive pulmonary disease, unspecified: Secondary | ICD-10-CM | POA: Diagnosis not present

## 2020-03-18 DIAGNOSIS — K219 Gastro-esophageal reflux disease without esophagitis: Secondary | ICD-10-CM | POA: Diagnosis not present

## 2020-03-25 DIAGNOSIS — D519 Vitamin B12 deficiency anemia, unspecified: Secondary | ICD-10-CM | POA: Diagnosis not present

## 2020-03-25 DIAGNOSIS — L603 Nail dystrophy: Secondary | ICD-10-CM | POA: Diagnosis not present

## 2020-04-05 DIAGNOSIS — K219 Gastro-esophageal reflux disease without esophagitis: Secondary | ICD-10-CM | POA: Diagnosis not present

## 2020-04-05 DIAGNOSIS — J449 Chronic obstructive pulmonary disease, unspecified: Secondary | ICD-10-CM | POA: Diagnosis not present

## 2020-04-05 DIAGNOSIS — D519 Vitamin B12 deficiency anemia, unspecified: Secondary | ICD-10-CM | POA: Diagnosis not present

## 2020-04-20 ENCOUNTER — Other Ambulatory Visit: Payer: Self-pay | Admitting: Student

## 2020-04-20 NOTE — Telephone Encounter (Signed)
This is a [facility] pt.  °

## 2020-04-30 DIAGNOSIS — R58 Hemorrhage, not elsewhere classified: Secondary | ICD-10-CM | POA: Diagnosis not present

## 2020-04-30 DIAGNOSIS — Z882 Allergy status to sulfonamides status: Secondary | ICD-10-CM | POA: Diagnosis not present

## 2020-04-30 DIAGNOSIS — Z7901 Long term (current) use of anticoagulants: Secondary | ICD-10-CM | POA: Diagnosis not present

## 2020-04-30 DIAGNOSIS — Z881 Allergy status to other antibiotic agents status: Secondary | ICD-10-CM | POA: Diagnosis not present

## 2020-04-30 DIAGNOSIS — Z974 Presence of external hearing-aid: Secondary | ICD-10-CM | POA: Diagnosis not present

## 2020-04-30 DIAGNOSIS — S00412A Abrasion of left ear, initial encounter: Secondary | ICD-10-CM | POA: Diagnosis not present

## 2020-05-12 DIAGNOSIS — I483 Typical atrial flutter: Secondary | ICD-10-CM | POA: Diagnosis not present

## 2020-05-12 DIAGNOSIS — F419 Anxiety disorder, unspecified: Secondary | ICD-10-CM | POA: Diagnosis not present

## 2020-05-12 DIAGNOSIS — J069 Acute upper respiratory infection, unspecified: Secondary | ICD-10-CM | POA: Diagnosis not present

## 2020-05-12 DIAGNOSIS — N390 Urinary tract infection, site not specified: Secondary | ICD-10-CM | POA: Diagnosis not present

## 2020-05-12 DIAGNOSIS — L603 Nail dystrophy: Secondary | ICD-10-CM | POA: Diagnosis not present

## 2020-05-12 DIAGNOSIS — N3281 Overactive bladder: Secondary | ICD-10-CM | POA: Diagnosis not present

## 2020-05-12 DIAGNOSIS — J449 Chronic obstructive pulmonary disease, unspecified: Secondary | ICD-10-CM | POA: Diagnosis not present

## 2020-05-12 DIAGNOSIS — R531 Weakness: Secondary | ICD-10-CM | POA: Diagnosis not present

## 2020-05-12 DIAGNOSIS — R410 Disorientation, unspecified: Secondary | ICD-10-CM | POA: Diagnosis not present

## 2020-05-12 DIAGNOSIS — H6123 Impacted cerumen, bilateral: Secondary | ICD-10-CM | POA: Diagnosis not present

## 2020-05-12 DIAGNOSIS — F331 Major depressive disorder, recurrent, moderate: Secondary | ICD-10-CM | POA: Diagnosis not present

## 2020-05-12 DIAGNOSIS — R05 Cough: Secondary | ICD-10-CM | POA: Diagnosis not present

## 2020-05-27 DIAGNOSIS — R682 Dry mouth, unspecified: Secondary | ICD-10-CM | POA: Diagnosis not present

## 2020-05-27 DIAGNOSIS — F411 Generalized anxiety disorder: Secondary | ICD-10-CM | POA: Diagnosis not present

## 2020-05-27 DIAGNOSIS — K219 Gastro-esophageal reflux disease without esophagitis: Secondary | ICD-10-CM | POA: Diagnosis not present

## 2020-05-27 DIAGNOSIS — M25512 Pain in left shoulder: Secondary | ICD-10-CM | POA: Diagnosis not present

## 2020-05-27 DIAGNOSIS — J449 Chronic obstructive pulmonary disease, unspecified: Secondary | ICD-10-CM | POA: Diagnosis not present

## 2020-05-27 DIAGNOSIS — I483 Typical atrial flutter: Secondary | ICD-10-CM | POA: Diagnosis not present

## 2020-05-27 DIAGNOSIS — F331 Major depressive disorder, recurrent, moderate: Secondary | ICD-10-CM | POA: Diagnosis not present

## 2020-05-27 DIAGNOSIS — D649 Anemia, unspecified: Secondary | ICD-10-CM | POA: Diagnosis not present

## 2020-05-27 DIAGNOSIS — R42 Dizziness and giddiness: Secondary | ICD-10-CM | POA: Diagnosis not present

## 2020-05-27 DIAGNOSIS — G4700 Insomnia, unspecified: Secondary | ICD-10-CM | POA: Diagnosis not present

## 2020-05-27 DIAGNOSIS — N3281 Overactive bladder: Secondary | ICD-10-CM | POA: Diagnosis not present

## 2020-06-01 NOTE — Progress Notes (Signed)
Cardiology Office Note    Date:  06/07/2020   ID:  [name], [name] December 11, 1930, MRN [id number]  PCP:  Benita Stabile, MD  Cardiologist: Dina Rich, MD EPS: None  No chief complaint on file.   History of Present Illness:  Diana Mata is a 86 y.o. female with history of PAF on Xarelto and diltiazem, mild aortic regurgitation on echo in 2019, chronic left bundle branch block, COPD, CKD stage III.  Last seen in our office 01/27/2020 and was doing well.  Patient comes in for f/u accompanied by her daughter. She complains of bleeding of her toenail where she has a toe fungus and had scratched her ear and couldn't stop. Also had blood in her urine this am with history of chronic UTI's and missed her appt because her other daughter died of esophageal cancer in Aug.Has been very sad and having a hard time coping with losing her daughter.  May not be staying hydrated or eating enough.  She lives alone but does see her daughter frequently.  Has chronic vertigo for which she takes meclizine for.  Does get dizzy with transitioning from lying or sitting positions but this is chronic and unchanged.  She denies chest pain, dyspnea, edema.  Has missed several appointments and blood work in the past few months    Past Medical History:  Diagnosis Date  . Atrial flutter ([facility])   . Chronic UTI   . COPD (chronic obstructive pulmonary disease) ([facility])   . Dizziness   . GERD (gastroesophageal reflux disease)   . IBS (irritable bowel syndrome)   . Renal disorder    renal insufficiency    Past Surgical History:  Procedure Laterality Date  . ABDOMINAL HYSTERECTOMY    . 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 JOINT REPLACEMENT      Current Medications: Current Meds  Medication Sig  . albuterol (PROVENTIL HFA;VENTOLIN HFA) 108 (90 Base) MCG/ACT inhaler Inhale 1-2 puffs into the lungs every 6 (six) hours as needed for wheezing or shortness of breath.  . diazepam (VALIUM) 5 MG tablet Take 5-10  mg by mouth every 6 (six) hours as needed for anxiety.  Marland Kitchen diltiazem (CARDIZEM) 30 MG tablet Take 1 tablet by mouth twice daily  . escitalopram (LEXAPRO) 5 MG tablet 5 mg daily.  . fluticasone (FLONASE) 50 MCG/ACT nasal spray Place 1 spray into both nostrils daily.  Marland Kitchen HYDROcodone-acetaminophen (NORCO/VICODIN) 5-325 MG tablet Take 1 tablet by mouth 2 (two) times daily.  Marland Kitchen loperamide (IMODIUM) 2 MG capsule Take 2 mg by mouth as needed for diarrhea or loose stools.  . meclizine (ANTIVERT) 25 MG tablet Take 25 mg by mouth every morning.  . Multiple Vitamins-Minerals (PRESERVISION AREDS PO) Take by mouth.  Marland Kitchen MYRBETRIQ 50 MG TB24 tablet 50 mg daily.  . nitrofurantoin (MACRODANTIN) 50 MG capsule Take 1 capsule (50 mg total) by mouth at bedtime.  Marland Kitchen omeprazole (PRILOSEC) 20 MG capsule Take 20 mg by mouth daily.  . ondansetron (ZOFRAN) 4 MG tablet Take 4 mg by mouth every 6 (six) hours as needed. for nausea  . RESTASIS 0.05 % ophthalmic emulsion INSTILL 1 DROP INTO EACH EYE TWICE DAILY  . Rivaroxaban (XARELTO) 15 MG TABS tablet Take 1 tablet (15 mg total) by mouth every morning.  . traZODone (DESYREL) 50 MG tablet Take 50 mg by mouth at bedtime.  . Trospium Chloride 60 MG CP24 TAKE 1 CAPSULE BY MOUTH AT BEDTIME     Allergies:   Levaquin [levofloxacin  in d5w], Penicillins, Sulfa antibiotics, Tequin [gatifloxacin], and Latex   Social History   Socioeconomic History  . Marital status: Widowed    Spouse name: Not on file  . Number of children: 3  . Years of education: Not on file  . Highest education level: Bachelor's degree (e.g., BA, AB, BS)  Occupational History  . Occupation: Retired  Tobacco Use  . Smoking status: Never Smoker  . Smokeless tobacco: Never Used  Vaping Use  . Vaping Use: Never used  Substance and Sexual Activity  . Alcohol use: Never    Comment: occ  . Drug use: Never  . Sexual activity: Not on file  Other Topics Concern  . Not on file  Social History Narrative    Patient moved to [place] from [place] in August 2019, to be near family   02/03/19 lives alone , 1 mile from daughter, Tresa Endo   Social Determinants of Health   Financial Resource Strain:   . Difficulty of Paying Living Expenses: Not on file  Food Insecurity:   . Worried About Programme researcher, broadcasting/film/video in the Last Year: Not on file  . Ran Out of Food in the Last Year: Not on file  Transportation Needs:   . Lack of Transportation (Medical): Not on file  . Lack of Transportation (Non-Medical): Not on file  Physical Activity:   . Days of Exercise per Week: Not on file  . Minutes of Exercise per Session: Not on file  Stress:   . Feeling of Stress : Not on file  Social Connections:   . Frequency of Communication with Friends and Family: Not on file  . Frequency of Social Gatherings with Friends and Family: Not on file  . Attends Religious Services: Not on file  . Active Member of Clubs or Organizations: Not on file  . Attends Banker Meetings: Not on file  . Marital Status: Not on file     Family History:  The patient's family history includes Hypertension in her mother.   ROS:   Please see the history of present illness.    ROS All other systems reviewed and are negative.   PHYSICAL EXAM:   VS:  BP 118/62   Pulse 84   Wt 125 lb (56.7 kg)   SpO2 99%   BMI 21.46 kg/m   Physical Exam  GEN: Thin in no acute distress  Neck: no JVD, carotid bruits, or masses Cardiac:RRR; 2/6 diastolic murmur at the left sternal border Respiratory:  clear to auscultation bilaterally, normal work of breathing GI: soft, nontender, nondistended, + BS Ext: without cyanosis, clubbing, or edema, Good distal pulses bilaterally Neuro:  Alert and Oriented x 3 Psych: euthymic mood, full affect  Wt Readings from Last 3 Encounters:  06/07/20 125 lb (56.7 kg)  01/27/20 123 lb (55.8 kg)  12/08/19 130 lb (59 kg)      Studies/Labs Reviewed:   EKG:  EKG is not ordered today.    Recent  Labs: No results found for requested labs within last 8760 hours.   Lipid Panel No results found for: CHOL, TRIG, HDL, CHOLHDL, VLDL, LDLCALC, LDLDIRECT  Additional studies/ records that were reviewed today include:  Carotid Dopplers 02/2019  Summary:  Right Carotid: Velocities in the right ICA are consistent with a 1-39%  stenosis.   Left Carotid: Velocities in the left ICA are consistent with a 1-39%  stenosis.   Vertebrals: Bilateral vertebral arteries demonstrate antegrade flow.  Subclavians: Normal flow hemodynamics were  seen in bilateral subclavian               arteries.   *See table(s) above for measurements and observations.     2D echo 2019 Study Conclusions   - Left ventricle: The cavity size was normal. Wall thickness was    increased in a pattern of mild LVH. Systolic function was normal.    The estimated ejection fraction was in the range of 60% to 65%.    Wall motion was normal; there were no regional wall motion    abnormalities. Left ventricular diastolic function parameters    were normal.  - Aortic valve: Mildly calcified annulus. Trileaflet; normal    thickness leaflets. There was mild regurgitation. Valve area    (VTI): 2.38 cm^2. Valve area (Vmax): 2.1 cm^2.  - Mitral valve: There was mild regurgitation.  - Atrial septum: No defect or patent foramen ovale was identified.  - Pulmonary arteries: PA peak pressure: 36 mm Hg (S).  - Technically adequate study.      ASSESSMENT:    1. Paroxysmal atrial flutter (HCC)   2. LBBB (left bundle branch block)   3. Aortic valve insufficiency, etiology of cardiac valve disease unspecified   4. Essential hypertension   5. Hematuria, unspecified type   6. Depression, unspecified depression type      PLAN:  In order of problems listed above:  Paroxysmal atrial flutter on Xarelto 15 mg and diltiazem 30 mg twice daily, no recent palpitations.  CHA2DS2-VASc equals 4. complains of easy bruising and bleeding from  an ingrown toenail and would like to come off Xarelto but after talking about stroke risk she is willing to stay on it.  Did have some hematuria this morning.  We will check surveillance labs including CBC.  Denies any melena or black stools  Chronic left bundle branch block noted on EKGs from her cardiologist in [facility]  Aortic regurgitation mild 06/2018 murmur on exam but patient asymptomatic.  We will hold off on echo  Essential hypertension blood pressure stable  CKD stage III check renal function today.  Hematuria we will check UA C and S and send results to Dr. Wilkie Aye who is her urologist  Depression, sadness over the loss of her daughter to esophageal cancer in August.  Will refer to Dr. Dellia Cloud for counseling.  Patient agreeable  Medication Adjustments/Labs and Tests Ordered: Current medicines are reviewed at length with the patient today.  Concerns regarding medicines are outlined above.  Medication changes, Labs and Tests ordered today are listed in the Patient Instructions below. Patient Instructions  Medication Instructions:  Your physician recommends that you continue on your current medications as directed. Please refer to the Current Medication list given to you today.  *If you need a refill on your cardiac medications before your next appointment, please call your pharmacy*   Lab Work: Your physician recommends that you return for lab work in: Today   If you have labs (blood work) drawn today and your tests are completely normal, you will receive your results only by: Marland Kitchen MyChart Message (if you have MyChart) OR . A paper copy in the mail If you have any lab test that is abnormal or we need to change your treatment, we will call you to review the results.   Testing/Procedures: NONE    Follow-Up: At [facility], you and your health needs are our priority.  As part of our continuing mission to provide you with exceptional heart care, we  have created  designated Provider Care Teams.  These Care Teams include your primary Cardiologist (physician) and Advanced Practice Providers (APPs -  Physician Assistants and Nurse Practitioners) who all work together to provide you with the care you need, when you need it.  We recommend signing up for the patient portal called "MyChart".  Sign up information is provided on this After Visit Summary.  MyChart is used to connect with patients for Virtual Visits (Telemedicine).  Patients are able to view lab/test results, encounter notes, upcoming appointments, etc.  Non-urgent messages can be sent to your provider as well.   To learn more about what you can do with MyChart, go to [email or web address].    Your next appointment:   6 month(s)  The format for your next appointment:   In Person  Provider:   Dina RichJonathan Branch, MD   Other Instructions Thank you for choosing [facility]!       Signed, Jacolyn ReedyMichele Braedin Millhouse, PA-C  06/07/2020 2:54 PM    [facility] [address], [place], [place]  [address] Phone: [phone]) [phone]; Fax: [phone]

## 2020-06-07 ENCOUNTER — Other Ambulatory Visit: Payer: Self-pay

## 2020-06-07 ENCOUNTER — Encounter: Payer: Self-pay | Admitting: Physician Assistant

## 2020-06-07 ENCOUNTER — Other Ambulatory Visit (HOSPITAL_COMMUNITY)
Admission: RE | Admit: 2020-06-07 | Discharge: 2020-06-07 | Disposition: A | Discharge status: Home / Self Care | Payer: PPO | Source: Ambulatory Visit | Attending: Physician Assistant | Admitting: Physician Assistant

## 2020-06-07 ENCOUNTER — Ambulatory Visit (INDEPENDENT_AMBULATORY_CARE_PROVIDER_SITE_OTHER): Payer: PPO | Admitting: Physician Assistant

## 2020-06-07 VITALS — BP 118/62 | HR 84 | Wt 125.0 lb

## 2020-06-07 DIAGNOSIS — I447 Left bundle-branch block, unspecified: Secondary | ICD-10-CM | POA: Insufficient documentation

## 2020-06-07 DIAGNOSIS — I4892 Unspecified atrial flutter: Secondary | ICD-10-CM | POA: Insufficient documentation

## 2020-06-07 DIAGNOSIS — F329 Major depressive disorder, single episode, unspecified: Secondary | ICD-10-CM | POA: Diagnosis not present

## 2020-06-07 DIAGNOSIS — I351 Nonrheumatic aortic (valve) insufficiency: Secondary | ICD-10-CM | POA: Diagnosis not present

## 2020-06-07 DIAGNOSIS — I10 Essential (primary) hypertension: Secondary | ICD-10-CM | POA: Insufficient documentation

## 2020-06-07 DIAGNOSIS — F32A Depression, unspecified: Secondary | ICD-10-CM | POA: Diagnosis not present

## 2020-06-07 DIAGNOSIS — R319 Hematuria, unspecified: Secondary | ICD-10-CM | POA: Diagnosis not present

## 2020-06-07 LAB — COMPREHENSIVE METABOLIC PANEL
ALT: 10 U/L (ref 0–44)
AST: 13 U/L — ABNORMAL LOW (ref 15–41)
Albumin: 3.7 g/dL (ref 3.5–5.0)
Alkaline Phosphatase: 74 U/L (ref 38–126)
Anion gap: 8 (ref 5–15)
BUN: 27 mg/dL — ABNORMAL HIGH (ref 8–23)
CO2: 23 mmol/L (ref 22–32)
Calcium: 8.9 mg/dL (ref 8.9–10.3)
Chloride: 104 mmol/L (ref 98–111)
Creatinine, Ser: 1.01 mg/dL — ABNORMAL HIGH (ref 0.44–1.00)
GFR calc Af Amer: 58 mL/min — ABNORMAL LOW (ref >60)
GFR calc non Af Amer: 50 mL/min — ABNORMAL LOW (ref >60)
Glucose, Bld: 98 mg/dL (ref 70–99)
Potassium: 4.5 mmol/L (ref 3.5–5.1)
Sodium: 135 mmol/L (ref 135–145)
Total Bilirubin: 0.6 mg/dL (ref 0.3–1.2)
Total Protein: 6.7 g/dL (ref 6.5–8.1)

## 2020-06-07 LAB — CBC
HCT: 34.3 % — ABNORMAL LOW (ref 36.0–46.0)
Hemoglobin: 11.1 g/dL — ABNORMAL LOW (ref 12.0–15.0)
MCH: 27.7 pg (ref 26.0–34.0)
MCHC: 32.4 g/dL (ref 30.0–36.0)
MCV: 85.5 fL (ref 80.0–100.0)
Platelets: 332 10*3/uL (ref 150–400)
RBC: 4.01 MIL/uL (ref 3.87–5.11)
RDW: 15.3 % (ref 11.5–15.5)
WBC: 7.9 10*3/uL (ref 4.0–10.5)
nRBC: 0 % (ref 0.0–0.2)

## 2020-06-07 LAB — TSH: TSH: 2.551 u[IU]/mL (ref 0.350–4.500)

## 2020-06-07 NOTE — Patient Instructions (Signed)
Medication Instructions:  Your physician recommends that you continue on your current medications as directed. Please refer to the Current Medication list given to you today.  *If you need a refill on your cardiac medications before your next appointment, please call your pharmacy*   Lab Work: Your physician recommends that you return for lab work in: Today   If you have labs (blood work) drawn today and your tests are completely normal, you will receive your results only by: MyChart Message (if you have MyChart) OR A paper copy in the mail If you have any lab test that is abnormal or we need to change your treatment, we will call you to review the results.   Testing/Procedures: NONE    Follow-Up: At [facility], you and your health needs are our priority.  As part of our continuing mission to provide you with exceptional heart care, we have created designated Provider Care Teams.  These Care Teams include your primary Cardiologist (physician) and Advanced Practice Providers (APPs -  Physician Assistants and Nurse Practitioners) who all work together to provide you with the care you need, when you need it.  We recommend signing up for the patient portal called "MyChart".  Sign up information is provided on this After Visit Summary.  MyChart is used to connect with patients for Virtual Visits (Telemedicine).  Patients are able to view lab/test results, encounter notes, upcoming appointments, etc.  Non-urgent messages can be sent to your provider as well.   To learn more about what you can do with MyChart, go to [email or web address].    Your next appointment:   6 month(s)  The format for your next appointment:   In Person  Provider:   Jonathan Branch, MD   Other Instructions Thank you for choosing [facility]!    

## 2020-06-23 DIAGNOSIS — K219 Gastro-esophageal reflux disease without esophagitis: Secondary | ICD-10-CM | POA: Diagnosis not present

## 2020-06-23 DIAGNOSIS — R682 Dry mouth, unspecified: Secondary | ICD-10-CM | POA: Diagnosis not present

## 2020-06-23 DIAGNOSIS — I483 Typical atrial flutter: Secondary | ICD-10-CM | POA: Diagnosis not present

## 2020-06-23 DIAGNOSIS — J449 Chronic obstructive pulmonary disease, unspecified: Secondary | ICD-10-CM | POA: Diagnosis not present

## 2020-06-23 DIAGNOSIS — F411 Generalized anxiety disorder: Secondary | ICD-10-CM | POA: Diagnosis not present

## 2020-06-23 DIAGNOSIS — M25512 Pain in left shoulder: Secondary | ICD-10-CM | POA: Diagnosis not present

## 2020-06-23 DIAGNOSIS — R42 Dizziness and giddiness: Secondary | ICD-10-CM | POA: Diagnosis not present

## 2020-06-23 DIAGNOSIS — N3281 Overactive bladder: Secondary | ICD-10-CM | POA: Diagnosis not present

## 2020-06-23 DIAGNOSIS — D649 Anemia, unspecified: Secondary | ICD-10-CM | POA: Diagnosis not present

## 2020-06-23 DIAGNOSIS — F331 Major depressive disorder, recurrent, moderate: Secondary | ICD-10-CM | POA: Diagnosis not present

## 2020-06-23 DIAGNOSIS — G4700 Insomnia, unspecified: Secondary | ICD-10-CM | POA: Diagnosis not present

## 2020-07-12 ENCOUNTER — Telehealth: Payer: Self-pay

## 2020-07-16 NOTE — Telephone Encounter (Signed)
Pt daughter called and made aware. Pt scheduled for lab appt on Monday.

## 2020-07-16 NOTE — Telephone Encounter (Signed)
Have her drop off a urine for culture

## 2020-07-19 ENCOUNTER — Other Ambulatory Visit: Payer: PPO

## 2020-07-19 ENCOUNTER — Telehealth: Payer: Self-pay

## 2020-07-19 NOTE — Telephone Encounter (Signed)
Pts drt notified to pls take pt to [facility] office across from our suite d/t our lab person being out today.

## 2020-08-02 ENCOUNTER — Ambulatory Visit: Payer: PPO | Admitting: Cardiovascular Disease

## 2020-08-05 DIAGNOSIS — Z23 Encounter for immunization: Secondary | ICD-10-CM | POA: Diagnosis not present

## 2020-08-05 DIAGNOSIS — D519 Vitamin B12 deficiency anemia, unspecified: Secondary | ICD-10-CM | POA: Diagnosis not present

## 2020-08-06 DIAGNOSIS — W19XXXA Unspecified fall, initial encounter: Secondary | ICD-10-CM | POA: Diagnosis not present

## 2020-08-06 DIAGNOSIS — I959 Hypotension, unspecified: Secondary | ICD-10-CM | POA: Diagnosis not present

## 2020-08-06 DIAGNOSIS — R531 Weakness: Secondary | ICD-10-CM | POA: Diagnosis not present

## 2020-08-06 DIAGNOSIS — R58 Hemorrhage, not elsewhere classified: Secondary | ICD-10-CM | POA: Diagnosis not present

## 2020-08-07 DIAGNOSIS — R55 Syncope and collapse: Secondary | ICD-10-CM | POA: Diagnosis not present

## 2020-08-07 DIAGNOSIS — S5012XA Contusion of left forearm, initial encounter: Secondary | ICD-10-CM | POA: Diagnosis not present

## 2020-08-07 DIAGNOSIS — S0003XA Contusion of scalp, initial encounter: Secondary | ICD-10-CM | POA: Diagnosis not present

## 2020-08-12 ENCOUNTER — Other Ambulatory Visit (HOSPITAL_COMMUNITY): Payer: Self-pay | Admitting: Internal Medicine

## 2020-08-12 ENCOUNTER — Other Ambulatory Visit: Payer: Self-pay

## 2020-08-12 ENCOUNTER — Ambulatory Visit (HOSPITAL_COMMUNITY)
Admission: RE | Admit: 2020-08-12 | Discharge: 2020-08-12 | Disposition: A | Discharge status: Home / Self Care | Payer: PPO | Source: Ambulatory Visit | Attending: Internal Medicine | Admitting: Internal Medicine

## 2020-08-12 DIAGNOSIS — W19XXXA Unspecified fall, initial encounter: Secondary | ICD-10-CM | POA: Insufficient documentation

## 2020-08-12 DIAGNOSIS — M79632 Pain in left forearm: Secondary | ICD-10-CM | POA: Diagnosis not present

## 2020-08-12 DIAGNOSIS — M79602 Pain in left arm: Secondary | ICD-10-CM

## 2020-08-17 ENCOUNTER — Telehealth: Payer: Self-pay

## 2020-08-17 ENCOUNTER — Other Ambulatory Visit: Payer: Self-pay

## 2020-08-17 DIAGNOSIS — N3021 Other chronic cystitis with hematuria: Secondary | ICD-10-CM

## 2020-08-17 NOTE — Telephone Encounter (Signed)
Pts drt called saying pt is now complaining with burning upon urination. Pt is bringing specimen tomorrow.

## 2020-08-18 ENCOUNTER — Other Ambulatory Visit: Payer: PPO

## 2020-08-20 DIAGNOSIS — N3021 Other chronic cystitis with hematuria: Secondary | ICD-10-CM | POA: Diagnosis not present

## 2020-08-20 LAB — URINALYSIS, ROUTINE W REFLEX MICROSCOPIC
Bilirubin, UA: NEGATIVE
Glucose, UA: NEGATIVE
Ketones, UA: NEGATIVE
Nitrite, UA: POSITIVE — AB
Specific Gravity, UA: 1.025 (ref 1.005–1.030)
Urobilinogen, Ur: 1 mg/dL (ref 0.2–1.0)
pH, UA: 5.5 (ref 5.0–7.5)

## 2020-08-20 LAB — MICROSCOPIC EXAMINATION: WBC, UA: >30 /HPF — AB (ref 0–5)

## 2020-08-23 ENCOUNTER — Telehealth: Payer: Self-pay | Admitting: Urology

## 2020-08-23 ENCOUNTER — Other Ambulatory Visit: Payer: Self-pay

## 2020-08-23 NOTE — Telephone Encounter (Signed)
Pt's daughter called about getting the results of her urine drop off she did Friday 12/17. Please call her when you can. Thanks!

## 2020-08-24 ENCOUNTER — Telehealth: Payer: Self-pay

## 2020-08-24 ENCOUNTER — Other Ambulatory Visit: Payer: Self-pay

## 2020-08-24 DIAGNOSIS — N3021 Other chronic cystitis with hematuria: Secondary | ICD-10-CM

## 2020-08-24 MED ORDER — AMOXICILLIN-POT CLAVULANATE 875-125 MG PO TABS: 1.0000 | ORAL_TABLET | Freq: Two times a day (BID) | ORAL | 0 refills | Status: DC

## 2020-08-24 NOTE — Progress Notes (Signed)
Daughter called and made aware. 

## 2020-08-24 NOTE — Telephone Encounter (Signed)
Pharmacy called verifying abx sent in by Dr. Ronne Binning was to be filled because said she has an allergy to Amoxicillin. Checked with Hope C. LPN that discussed with Dr. Ronne Binning when sending in and he said to send it. Pharmacy notified. They were going to double check with pt.

## 2020-08-27 LAB — CULTURE, URINE COMPREHENSIVE

## 2020-08-31 ENCOUNTER — Telehealth: Payer: Self-pay

## 2020-08-31 NOTE — Telephone Encounter (Signed)
Daughter notified. Pt will continue current therapy.

## 2020-08-31 NOTE — Telephone Encounter (Signed)
-----   Message from Malen Gauze, MD sent at 08/31/2020 11:06 AM EST ----- She should be on augmentin ----- Message ----- From: Ferdinand Lango, RN Sent: 08/31/2020  10:09 AM EST To: Malen Gauze, MD  Please review

## 2020-09-02 DIAGNOSIS — Z23 Encounter for immunization: Secondary | ICD-10-CM | POA: Diagnosis not present

## 2020-09-23 ENCOUNTER — Other Ambulatory Visit: Payer: Self-pay

## 2020-09-23 DIAGNOSIS — N3021 Other chronic cystitis with hematuria: Secondary | ICD-10-CM

## 2020-09-23 MED ORDER — TROSPIUM CHLORIDE ER 60 MG PO CP24: 1.0000 | ORAL_CAPSULE | Freq: Every day | ORAL | 1 refills | Status: DC

## 2020-09-24 DIAGNOSIS — R5381 Other malaise: Secondary | ICD-10-CM | POA: Diagnosis not present

## 2020-09-24 DIAGNOSIS — W19XXXA Unspecified fall, initial encounter: Secondary | ICD-10-CM | POA: Diagnosis not present

## 2020-09-24 DIAGNOSIS — R531 Weakness: Secondary | ICD-10-CM | POA: Diagnosis not present

## 2020-10-07 DIAGNOSIS — N390 Urinary tract infection, site not specified: Secondary | ICD-10-CM | POA: Diagnosis not present

## 2020-10-07 DIAGNOSIS — F419 Anxiety disorder, unspecified: Secondary | ICD-10-CM | POA: Diagnosis not present

## 2020-10-07 DIAGNOSIS — H6123 Impacted cerumen, bilateral: Secondary | ICD-10-CM | POA: Diagnosis not present

## 2020-10-07 DIAGNOSIS — N3281 Overactive bladder: Secondary | ICD-10-CM | POA: Diagnosis not present

## 2020-10-07 DIAGNOSIS — J449 Chronic obstructive pulmonary disease, unspecified: Secondary | ICD-10-CM | POA: Diagnosis not present

## 2020-10-07 DIAGNOSIS — L603 Nail dystrophy: Secondary | ICD-10-CM | POA: Diagnosis not present

## 2020-10-07 DIAGNOSIS — F331 Major depressive disorder, recurrent, moderate: Secondary | ICD-10-CM | POA: Diagnosis not present

## 2020-10-07 DIAGNOSIS — R410 Disorientation, unspecified: Secondary | ICD-10-CM | POA: Diagnosis not present

## 2020-10-07 DIAGNOSIS — R531 Weakness: Secondary | ICD-10-CM | POA: Diagnosis not present

## 2020-10-07 DIAGNOSIS — I483 Typical atrial flutter: Secondary | ICD-10-CM | POA: Diagnosis not present

## 2020-10-07 DIAGNOSIS — S5012XA Contusion of left forearm, initial encounter: Secondary | ICD-10-CM | POA: Diagnosis not present

## 2020-10-07 DIAGNOSIS — J069 Acute upper respiratory infection, unspecified: Secondary | ICD-10-CM | POA: Diagnosis not present

## 2020-11-01 DIAGNOSIS — N3281 Overactive bladder: Secondary | ICD-10-CM | POA: Diagnosis not present

## 2020-11-01 DIAGNOSIS — F331 Major depressive disorder, recurrent, moderate: Secondary | ICD-10-CM | POA: Diagnosis not present

## 2020-11-01 DIAGNOSIS — K219 Gastro-esophageal reflux disease without esophagitis: Secondary | ICD-10-CM | POA: Diagnosis not present

## 2020-11-01 DIAGNOSIS — F419 Anxiety disorder, unspecified: Secondary | ICD-10-CM | POA: Diagnosis not present

## 2020-11-10 DIAGNOSIS — D519 Vitamin B12 deficiency anemia, unspecified: Secondary | ICD-10-CM | POA: Diagnosis not present

## 2020-11-10 DIAGNOSIS — J449 Chronic obstructive pulmonary disease, unspecified: Secondary | ICD-10-CM | POA: Diagnosis not present

## 2020-11-10 DIAGNOSIS — D649 Anemia, unspecified: Secondary | ICD-10-CM | POA: Diagnosis not present

## 2020-11-15 ENCOUNTER — Ambulatory Visit (INDEPENDENT_AMBULATORY_CARE_PROVIDER_SITE_OTHER): Payer: Medicare Other | Admitting: Urology

## 2020-11-15 ENCOUNTER — Other Ambulatory Visit: Payer: Self-pay

## 2020-11-15 VITALS — BP 103/63 | HR 87 | Temp 97.6°F

## 2020-11-15 DIAGNOSIS — N3281 Overactive bladder: Secondary | ICD-10-CM | POA: Diagnosis not present

## 2020-11-15 DIAGNOSIS — N3021 Other chronic cystitis with hematuria: Secondary | ICD-10-CM

## 2020-11-15 LAB — BLADDER SCAN AMB NON-IMAGING: Scan Result: 93

## 2020-11-15 MED ORDER — NITROFURANTOIN MACROCRYSTAL 50 MG PO CAPS: 50.0000 mg | ORAL_CAPSULE | Freq: Every evening | ORAL | 3 refills | Status: DC

## 2020-11-15 MED ORDER — MYRBETRIQ 50 MG PO TB24: 50.0000 mg | ORAL_TABLET | Freq: Every day | ORAL | 11 refills | Status: DC

## 2020-11-15 MED ORDER — TROSPIUM CHLORIDE ER 60 MG PO CP24: 1.0000 | ORAL_CAPSULE | Freq: Every day | ORAL | 1 refills | Status: DC

## 2020-11-15 NOTE — Progress Notes (Signed)
11/15/2020 2:54 PM   Diana Mata [birth year] [id number]  Referring provider: Benita Stabile, MD [address] [place],  [place] [address]  followup recurrent UTI and OAB  HPI: [name] is a 87yo here for followup for OAb and recurrent UTI. She has had 2 UTIs since last visit. She is on macrobid 50mg  qhs. She currently on trospium 60mg  which works fair for her urge incontinence. She uses 4 pads in a 24 hour period. She is unhappy with her incontinence. Urine stream is strong. No feeling of incomplete emptying.    PMH: Past Medical History:  Diagnosis Date  . Atrial flutter (HCC)   . Chronic UTI   . COPD (chronic obstructive pulmonary disease) (HCC)   . Dizziness   . GERD (gastroesophageal reflux disease)   . IBS (irritable bowel syndrome)   . Renal disorder    renal insufficiency    Surgical History: Past Surgical History:  Procedure Laterality Date  . ABDOMINAL HYSTERECTOMY    . 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 JOINT REPLACEMENT      Home Medications:  Allergies as of 11/15/2020      Reactions   Levaquin [levofloxacin In D5w] Itching   Lower extremities   Penicillins Nausea Only   Has patient had a PCN reaction causing immediate rash, facial/tongue/throat swelling, SOB or lightheadedness with hypotension: No Has patient had a PCN reaction causing severe rash involving mucus membranes or skin necrosis: No Has patient had a PCN reaction that required hospitalization: No Has patient had a PCN reaction occurring within the last 10 years: No If all of the above answers are "NO", then may proceed with Cephalosporin use.   Sulfa Antibiotics Hives   Tequin [gatifloxacin] Itching   Lower extremities   Latex Itching, Rash      Medication List       Accurate as of November 15, 2020  2:54 PM. If you have any questions, ask your nurse or doctor.        albuterol 108 (90 Base) MCG/ACT inhaler Commonly known as: VENTOLIN HFA Inhale 1-2 puffs into the lungs every 6  (six) hours as needed for wheezing or shortness of breath.   amoxicillin-clavulanate 875-125 MG tablet Commonly known as: AUGMENTIN Take 1 tablet by mouth every 12 (twelve) hours.   diazepam 5 MG tablet Commonly known as: VALIUM Take 5-10 mg by mouth every 6 (six) hours as needed for anxiety.   diltiazem 30 MG tablet Commonly known as: CARDIZEM Take 1 tablet by mouth twice daily   escitalopram 5 MG tablet Commonly known as: LEXAPRO 5 mg daily.   fluticasone 50 MCG/ACT nasal spray Commonly known as: FLONASE Place 1 spray into both nostrils daily.   HYDROcodone-acetaminophen 5-325 MG tablet Commonly known as: NORCO/VICODIN Take 1 tablet by mouth 2 (two) times daily.   loperamide 2 MG capsule Commonly known as: IMODIUM Take 2 mg by mouth as needed for diarrhea or loose stools.   meclizine 25 MG tablet Commonly known as: ANTIVERT Take 25 mg by mouth every morning.   Myrbetriq 50 MG Tb24 tablet Generic drug: mirabegron ER 50 mg daily.   nitrofurantoin 50 MG capsule Commonly known as: MACRODANTIN Take 1 capsule (50 mg total) by mouth at bedtime.   omeprazole 20 MG capsule Commonly known as: PRILOSEC Take 20 mg by mouth daily.   ondansetron 4 MG tablet Commonly known as: ZOFRAN Take 4 mg by mouth every 6 (six) hours as needed. for nausea  PRESERVISION AREDS PO Take by mouth.   Restasis 0.05 % ophthalmic emulsion Generic drug: cycloSPORINE INSTILL 1 DROP INTO EACH EYE TWICE DAILY   Rivaroxaban 15 MG Tabs tablet Commonly known as: Xarelto Take 1 tablet (15 mg total) by mouth every morning.   traZODone 50 MG tablet Commonly known as: DESYREL Take 50 mg by mouth at bedtime.   Trospium Chloride 60 MG Cp24 Take 1 capsule (60 mg total) by mouth at bedtime.       Allergies:  Allergies  Allergen Reactions  . Levaquin [Levofloxacin In D5w] Itching    Lower extremities  . Penicillins Nausea Only    Has patient had a PCN reaction causing immediate rash,  facial/tongue/throat swelling, SOB or lightheadedness with hypotension: No Has patient had a PCN reaction causing severe rash involving mucus membranes or skin necrosis: No Has patient had a PCN reaction that required hospitalization: No Has patient had a PCN reaction occurring within the last 10 years: No If all of the above answers are "NO", then may proceed with Cephalosporin use.   . Sulfa Antibiotics Hives  . Tequin [Gatifloxacin] Itching    Lower extremities  . Latex Itching and Rash    Family History: Family History  Problem Relation Age of Onset  . Hypertension Mother     Social History:  reports that she has never smoked. She has never used smokeless tobacco. She reports that she does not drink alcohol and does not use drugs.  ROS: All other review of systems were reviewed and are negative except what is noted above in HPI  Physical Exam: There were no vitals taken for this visit.  Constitutional:  Alert and oriented, No acute distress. HEENT: [place] AT, moist mucus membranes.  Trachea midline, no masses. Cardiovascular: No clubbing, cyanosis, or edema. Respiratory: Normal respiratory effort, no increased work of breathing. GI: Abdomen is soft, nontender, nondistended, no abdominal masses GU: No CVA tenderness.  Lymph: No cervical or inguinal lymphadenopathy. Skin: No rashes, bruises or suspicious lesions. Neurologic: Grossly intact, no focal deficits, moving all 4 extremities. Psychiatric: Normal mood and affect.  Laboratory Data: Lab Results  Component Value Date   WBC 7.9 06/07/2020   HGB 11.1 (L) 06/07/2020   HCT 34.3 (L) 06/07/2020   MCV 85.5 06/07/2020   PLT 332 06/07/2020    Lab Results  Component Value Date   CREATININE 1.01 (H) 06/07/2020    No results found for: PSA  No results found for: TESTOSTERONE  No results found for: HGBA1C  Urinalysis    Component Value Date/Time   COLORURINE YELLOW 11/27/2018 1535   APPEARANCEUR Cloudy (A)  08/20/2020 0912   LABSPEC 1.017 11/27/2018 1535   PHURINE 5.0 11/27/2018 1535   GLUCOSEU Negative 08/20/2020 0912   HGBUR NEGATIVE 11/27/2018 1535   BILIRUBINUR Negative 08/20/2020 0912   KETONESUR NEGATIVE 11/27/2018 1535   PROTEINUR 2+ (A) 08/20/2020 0912   PROTEINUR 100 (A) 11/27/2018 1535   UROBILINOGEN negative (A) 09/24/2019 1629   NITRITE Positive (A) 08/20/2020 0912   NITRITE NEGATIVE 11/27/2018 1535   LEUKOCYTESUR 3+ (A) 08/20/2020 0912   LEUKOCYTESUR MODERATE (A) 11/27/2018 1535    Lab Results  Component Value Date   LABMICR See below: 08/20/2020   WBCUA >30 (A) 08/20/2020   LABEPIT 0-10 08/20/2020   BACTERIA Many (A) 08/20/2020    Pertinent Imaging:  No results found for this or any previous visit.  No results found for this or any previous visit.  No results found for this  or any previous visit.  No results found for this or any previous visit.  No results found for this or any previous visit.  No results found for this or any previous visit.  No results found for this or any previous visit.  No results found for this or any previous visit.   Assessment & Plan:    1. Chronic cystitis with hematuria Continue macrobid 50mg  qhs - Urinalysis, Routine w reflex microscopic  2. OAB - We discussed PTNS, intravesical botox and interstim and after discussing the options the patient elects for PTNS   No follow-ups on file.  , MD  [facility] [place]

## 2020-11-15 NOTE — Progress Notes (Signed)
Bladder Scan Patient cannot void: 93 ml Performed By: Bridgette Habermann, lpn    Urological Symptom Review  Patient is experiencing the following symptoms: Frequent urination Hard to postpone urination Get up at night to urinate Leakage of urine   Review of Systems  Gastrointestinal (upper)  : Negative for upper GI symptoms  Gastrointestinal (lower) : Diarrhea Constipation  Constitutional : Negative for symptoms  Skin: Negative for skin symptoms  Eyes: Negative for eye symptoms  Ear/Nose/Throat : Negative for Ear/Nose/Throat symptoms  Hematologic/Lymphatic: Easy bruising  Cardiovascular : Negative for cardiovascular symptoms  Respiratory : Negative for respiratory symptoms  Endocrine: Excessive thirst  Musculoskeletal: Negative for musculoskeletal symptoms  Neurological: Negative for neurological symptoms  Psychologic: Negative for psychiatric symptoms

## 2020-11-16 DIAGNOSIS — N3021 Other chronic cystitis with hematuria: Secondary | ICD-10-CM | POA: Diagnosis not present

## 2020-11-16 LAB — MICROSCOPIC EXAMINATION
Epithelial Cells (non renal): >10 /hpf — AB (ref 0–10)
Renal Epithel, UA: NONE SEEN /hpf
WBC, UA: >30 /hpf — AB (ref 0–5)

## 2020-11-16 LAB — URINALYSIS, ROUTINE W REFLEX MICROSCOPIC
Bilirubin, UA: NEGATIVE
Glucose, UA: NEGATIVE
Ketones, UA: NEGATIVE
Nitrite, UA: POSITIVE — AB
Protein,UA: NEGATIVE
Specific Gravity, UA: 1.015 (ref 1.005–1.030)
Urobilinogen, Ur: 0.2 mg/dL (ref 0.2–1.0)
pH, UA: 6 (ref 5.0–7.5)

## 2020-11-22 ENCOUNTER — Encounter: Payer: Self-pay | Admitting: Urology

## 2020-11-22 ENCOUNTER — Other Ambulatory Visit: Payer: Self-pay

## 2020-11-22 ENCOUNTER — Ambulatory Visit (INDEPENDENT_AMBULATORY_CARE_PROVIDER_SITE_OTHER): Payer: Medicare Other

## 2020-11-22 DIAGNOSIS — N3281 Overactive bladder: Secondary | ICD-10-CM | POA: Diagnosis not present

## 2020-11-22 NOTE — Patient Instructions (Signed)
Overactive Bladder, Adult  Overactive bladder is a condition in which a person has a sudden and frequent need to urinate. A person might also leak urine if he or she cannot get to the bathroom fast enough (urinary incontinence). Sometimes, symptoms can interfere with work or social activities. What are the causes? Overactive bladder is associated with poor nerve signals between your bladder and your brain. Your bladder may get the signal to empty before it is full. You may also have very sensitive muscles that make your bladder squeeze too soon. This condition may also be caused by other factors, such as:  Medical conditions: ? Urinary tract infection. ? Infection of nearby tissues. ? Prostate enlargement. ? Bladder stones, inflammation, or tumors. ? Diabetes. ? Muscle or nerve weakness, especially from these conditions:  A spinal cord injury.  Stroke.  Multiple sclerosis.  Parkinson's disease.  Other causes: ? Surgery on the uterus or urethra. ? Drinking too much caffeine or alcohol. ? Certain medicines, especially those that eliminate extra fluid in the body (diuretics). ? Constipation. What increases the risk? You may be at greater risk for overactive bladder if you:  Are an older adult.  Smoke.  Are going through menopause.  Have prostate problems.  Have a neurological disease, such as stroke, dementia, Parkinson's disease, or multiple sclerosis (MS).  Eat or drink alcohol, spicy food, caffeine, and other things that irritate the bladder.  Are overweight or obese. What are the signs or symptoms? Symptoms of this condition include a sudden, strong urge to urinate. Other symptoms include:  Leaking urine.  Urinating 8 or more times a day.  Waking up to urinate 2 or more times overnight. How is this diagnosed? This condition may be diagnosed based on:  Your symptoms and medical history.  A physical exam.  Blood or urine tests to check for possible causes,  such as infection. You may also need to see a health care provider who specializes in urinary tract problems. This is called a urologist. How is this treated? Treatment for overactive bladder depends on the cause of your condition and whether it is mild or severe. Treatment may include:  Bladder training, such as: ? Learning to control the urge to urinate by following a schedule to urinate at regular intervals. ? Doing Kegel exercises to strengthen the pelvic floor muscles that support your bladder.  Special devices, such as: ? Biofeedback. This uses sensors to help you become aware of your body's signals. ? Electrical stimulation. This uses electrodes placed inside the body (implanted) or outside the body. These electrodes send gentle pulses of electricity to strengthen the nerves or muscles that control the bladder. ? Women may use a plastic device, called a pessary, that fits into the vagina and supports the bladder.  Medicines, such as: ? Antibiotics to treat bladder infection. ? Antispasmodics to stop the bladder from releasing urine at the wrong time. ? Tricyclic antidepressants to relax bladder muscles. ? Injections of botulinum toxin type A directly into the bladder tissue to relax bladder muscles.  Surgery, such as: ? A device may be implanted to help manage the nerve signals that control urination. ? An electrode may be implanted to stimulate electrical signals in the bladder. ? A procedure may be done to change the shape of the bladder. This is done only in very severe cases. Follow these instructions at home: Eating and drinking  Make diet or lifestyle changes recommended by your health care provider. These may include: ? Drinking fluids   throughout the day and not only with meals. ? Cutting down on caffeine or alcohol. ? Eating a healthy and balanced diet to prevent constipation. This may include:  Choosing foods that are high in fiber, such as beans, whole grains, and  fresh fruits and vegetables.  Limiting foods that are high in fat and processed sugars, such as fried and sweet foods.   Lifestyle  Lose weight if needed.  Do not use any products that contain nicotine or tobacco. These include cigarettes, chewing tobacco, and vaping devices, such as e-cigarettes. If you need help quitting, ask your health care provider.   General instructions  Take over-the-counter and prescription medicines only as told by your health care provider.  If you were prescribed an antibiotic medicine, take it as told by your health care provider. Do not stop taking the antibiotic even if you start to feel better.  Use any implants or pessary as told by your health care provider.  If needed, wear pads to absorb urine leakage.  Keep a log to track how much and when you drink, and when you need to urinate. This will help your health care provider monitor your condition.  Keep all follow-up visits. This is important. Contact a health care provider if:  You have a fever or chills.  Your symptoms do not get better with treatment.  Your pain and discomfort get worse.  You have more frequent urges to urinate. Get help right away if:  You are not able to control your bladder. Summary  Overactive bladder refers to a condition in which a person has a sudden and frequent need to urinate.  Several conditions may lead to an overactive bladder.  Treatment for overactive bladder depends on the cause and severity of your condition.  Making lifestyle changes, doing Kegel exercises, keeping a log, and taking medicines can help with this condition. This information is not intended to replace advice given to you by your health care provider. Make sure you discuss any questions you have with your health care provider. Document Revised: 05/10/2020 Document Reviewed: 05/10/2020 Elsevier Patient Education  2021 [employer]  

## 2020-11-22 NOTE — Progress Notes (Signed)
PTNS  Session # 1 of 12  Health & Social Factors: none Caffeine: 1 cup Alcohol: none Daytime voids #per day: many Night-time voids #per night: 1-2 Urgency: oh yes! Incontinence Episodes #per day: all the time Ankle used: left Treatment Setting: 9 Feeling/ Response: positive sensation from heel to toe Comments: none  Performed By: Bridgette Habermann, lpn  Assistant: none  Follow Up: keep next scheduled appointment

## 2020-11-29 ENCOUNTER — Other Ambulatory Visit: Payer: Self-pay

## 2020-11-29 ENCOUNTER — Ambulatory Visit (INDEPENDENT_AMBULATORY_CARE_PROVIDER_SITE_OTHER): Payer: Medicare Other

## 2020-11-29 DIAGNOSIS — N3281 Overactive bladder: Secondary | ICD-10-CM | POA: Diagnosis not present

## 2020-11-29 NOTE — Progress Notes (Addendum)
PTNS  Session # 2 of 12  Health & Social Factors: no Caffeine: 1-2 cups a day Alcohol: none Daytime voids #per day: many Night-time voids #per night: 1 Urgency: oh yes! Incontinence Episodes #per day: all the time Ankle used: right Treatment Setting: 3 Feeling/ Response: positive from heel to toe Comments: none  Performed By: Bridgette Habermann, lpn  Assistant: n/a  Follow Up: keep next scheduled NV

## 2020-12-01 DIAGNOSIS — E1165 Type 2 diabetes mellitus with hyperglycemia: Secondary | ICD-10-CM | POA: Diagnosis not present

## 2020-12-01 DIAGNOSIS — I10 Essential (primary) hypertension: Secondary | ICD-10-CM | POA: Diagnosis not present

## 2020-12-03 DIAGNOSIS — H6123 Impacted cerumen, bilateral: Secondary | ICD-10-CM | POA: Diagnosis not present

## 2020-12-06 ENCOUNTER — Ambulatory Visit (INDEPENDENT_AMBULATORY_CARE_PROVIDER_SITE_OTHER): Payer: Medicare Other

## 2020-12-06 ENCOUNTER — Other Ambulatory Visit: Payer: Self-pay

## 2020-12-06 DIAGNOSIS — N3281 Overactive bladder: Secondary | ICD-10-CM | POA: Diagnosis not present

## 2020-12-06 NOTE — Progress Notes (Signed)
PTNS  Session # #3 of 12  Health & Social Factors: none Caffeine: 1-2 cups Alcohol: none Daytime voids #per day: alot Night-time voids #per night: 1-2 times Urgency: some Incontinence Episodes #per day: all day Ankle used: left Treatment Setting: 7 Feeling/ Response: positive sensation Comments: n/a  Performed By: Bridgette Habermann, lpn  Assistant: none  Follow Up: Keep next scheduled NV

## 2020-12-13 ENCOUNTER — Other Ambulatory Visit: Payer: Self-pay | Admitting: Urology

## 2020-12-13 DIAGNOSIS — N3021 Other chronic cystitis with hematuria: Secondary | ICD-10-CM

## 2020-12-13 DIAGNOSIS — N3281 Overactive bladder: Secondary | ICD-10-CM

## 2020-12-16 ENCOUNTER — Other Ambulatory Visit: Payer: Self-pay

## 2020-12-16 ENCOUNTER — Ambulatory Visit (INDEPENDENT_AMBULATORY_CARE_PROVIDER_SITE_OTHER): Payer: Medicare Other

## 2020-12-16 DIAGNOSIS — N3281 Overactive bladder: Secondary | ICD-10-CM | POA: Diagnosis not present

## 2020-12-16 NOTE — Progress Notes (Signed)
PTNS  Session # 4 of 12  Health & Social Factors: none Caffeine: 1-2 cups Alcohol: none Daytime voids #per day: many Night-time voids #per night: 1-2 Urgency: sometimes Incontinence Episodes #per day: all the time Ankle used: right Treatment Setting: 7 Feeling/ Response: positive sensation from heel to toe Comments: none  Performed By: Bridgette Habermann, lpn  Assistant: A. Nedra Hai RN  Follow Up: keep next scheduled NV

## 2020-12-16 NOTE — Patient Instructions (Signed)
Percutaneous Nerve Evaluation for Sacral Nerve Stimulation  Sacral nerve stimulation (SNS) is a treatment that uses an implanted device that sends mild electrical impulses to the sacral nerves. The sacral nerves control several functions in the lower part of the body, including bladder and bowel functions. Sacral nerve stimulation can be used to treat disorders that make it hard to control urination and bowel movements. Before having a sacral nerve stimulator implanted, you may go through a trial known as a percutaneous nerve evaluation. This trial helps determine if SNS will help your condition. For the trial, an outpatient procedure will be done to insert temporary wires into your body. Electric pulses will travel through these wires (electrodes) to an area close to your sacral nerves. The electrodes are connected to the temporary nerve stimulator, which remains outside of your body. The nerve stimulator will send electric pulses during the trial period, which usually lasts 1-2 weeks. Tell a health care provider about:  Any allergies you have.  All medicines you are taking, including vitamins, herbs, eye drops, creams, and over-the-counter medicines.  Any problems you or family members have had with anesthetic medicines.  Any blood disorders you have.  Any surgeries you have had.  Any medical conditions you have.  Whether you are pregnant or may be pregnant. What are the risks? Generally, this is a safe procedure. However, problems may occur, including:  Movement of the electrode away from the place where it was inserted (migration).  Infection.  Bleeding.  Damage to nearby structures or organs, such as nerves near the spine.  Uncomfortable sensations, such as a jolting or shocking feeling.  Allergic reactions to medicines. What happens before the procedure? Medicines Ask your health care provider about:  Changing or stopping your regular medicines. This is especially important  if you are taking diabetes medicines or blood thinners.  Taking medicines such as aspirin and ibuprofen. These medicines can thin your blood. Do not take these medicines unless your health care provider tells you to take them.  Taking over-the-counter medicines, vitamins, herbs, and supplements. General instructions  Follow instructions from your health care provider about eating or drinking restrictions.  Ask your health care provider what steps will be taken to help prevent infection. These steps may include: ? Removing hair at the procedure site. ? Washing skin with a germ-killing soap. ? Taking antibiotic medicine. What happens during the procedure?  You will be given a medicine to numb the area (local anesthetic).  Long needles will be inserted into your lower back.  The needles will be guided to the place where the nerves exit the backbone. Your health care provider may use a type of X-ray (fluoroscopy) to guide the needles to the right spot.  The position of the needles will be tested. If they are in the right spot, your toes or feet may move. If you are awake, you may feel a tingling in your legs.  Electrodes will be inserted through the needles and into your body.  The electrodes will be anchored in place close to your sacral nerves.  The ends of the electrodes outside your body will be connected to a nerve stimulator device.  A bandage (dressing) will be applied to cover the electrodes.  Your health care provider will program the rate at which the nerve stimulator will deliver the electric pulses. The procedure may vary among health care providers and hospitals. What happens after the procedure?  Your blood pressure, heart rate, breathing rate, and blood oxygen   level will be monitored until you leave the hospital or clinic.  You will wear the sacral nerve stimulator for the trial period. You will be taught how to use the device.  You may be given pain medicine as  needed. Summary  Sacral nerve stimulation (SNS) is a treatment that uses an implanted device that sends mild electrical impulses to the sacral nerves.  This procedure will determine if a sacral nerve stimulator will help you. If so, you will need to have a second procedure to have a permanent device implanted.  The sacral nerves control several functions in the lower part of the body, including bladder and bowel functions.  SNS can be used to treat disorders that make it hard to control urination and bowel movements. This information is not intended to replace advice given to you by your health care provider. Make sure you discuss any questions you have with your health care provider. Document Revised: 03/26/2020 Document Reviewed: 03/26/2020 Elsevier Patient Education  2021 [employer]  

## 2020-12-21 ENCOUNTER — Ambulatory Visit (INDEPENDENT_AMBULATORY_CARE_PROVIDER_SITE_OTHER): Payer: Medicare Other

## 2020-12-21 ENCOUNTER — Other Ambulatory Visit: Payer: Self-pay

## 2020-12-21 DIAGNOSIS — N3281 Overactive bladder: Secondary | ICD-10-CM | POA: Diagnosis not present

## 2020-12-21 NOTE — Patient Instructions (Addendum)
Percutaneous Nerve Evaluation for Sacral Nerve Stimulation  Sacral nerve stimulation (SNS) is a treatment that uses an implanted device that sends mild electrical impulses to the sacral nerves. The sacral nerves control several functions in the lower part of the body, including bladder and bowel functions. Sacral nerve stimulation can be used to treat disorders that make it hard to control urination and bowel movements. Before having a sacral nerve stimulator implanted, you may go through a trial known as a percutaneous nerve evaluation. This trial helps determine if SNS will help your condition. For the trial, an outpatient procedure will be done to insert temporary wires into your body. Electric pulses will travel through these wires (electrodes) to an area close to your sacral nerves. The electrodes are connected to the temporary nerve stimulator, which remains outside of your body. The nerve stimulator will send electric pulses during the trial period, which usually lasts 1-2 weeks. Tell a health care provider about:  Any allergies you have.  All medicines you are taking, including vitamins, herbs, eye drops, creams, and over-the-counter medicines.  Any problems you or family members have had with anesthetic medicines.  Any blood disorders you have.  Any surgeries you have had.  Any medical conditions you have.  Whether you are pregnant or may be pregnant. What are the risks? Generally, this is a safe procedure. However, problems may occur, including:  Movement of the electrode away from the place where it was inserted (migration).  Infection.  Bleeding.  Damage to nearby structures or organs, such as nerves near the spine.  Uncomfortable sensations, such as a jolting or shocking feeling.  Allergic reactions to medicines. What happens before the procedure? Medicines Ask your health care provider about:  Changing or stopping your regular medicines. This is especially important  if you are taking diabetes medicines or blood thinners.  Taking medicines such as aspirin and ibuprofen. These medicines can thin your blood. Do not take these medicines unless your health care provider tells you to take them.  Taking over-the-counter medicines, vitamins, herbs, and supplements. General instructions  Follow instructions from your health care provider about eating or drinking restrictions.  Ask your health care provider what steps will be taken to help prevent infection. These steps may include: ? Removing hair at the procedure site. ? Washing skin with a germ-killing soap. ? Taking antibiotic medicine. What happens during the procedure?  You will be given a medicine to numb the area (local anesthetic).  Long needles will be inserted into your lower back.  The needles will be guided to the place where the nerves exit the backbone. Your health care provider may use a type of X-ray (fluoroscopy) to guide the needles to the right spot.  The position of the needles will be tested. If they are in the right spot, your toes or feet may move. If you are awake, you may feel a tingling in your legs.  Electrodes will be inserted through the needles and into your body.  The electrodes will be anchored in place close to your sacral nerves.  The ends of the electrodes outside your body will be connected to a nerve stimulator device.  A bandage (dressing) will be applied to cover the electrodes.  Your health care provider will program the rate at which the nerve stimulator will deliver the electric pulses. The procedure may vary among health care providers and hospitals. What happens after the procedure?  Your blood pressure, heart rate, breathing rate, and blood oxygen   level will be monitored until you leave the hospital or clinic.  You will wear the sacral nerve stimulator for the trial period. You will be taught how to use the device.  You may be given pain medicine as  needed. Summary  Sacral nerve stimulation (SNS) is a treatment that uses an implanted device that sends mild electrical impulses to the sacral nerves.  This procedure will determine if a sacral nerve stimulator will help you. If so, you will need to have a second procedure to have a permanent device implanted.  The sacral nerves control several functions in the lower part of the body, including bladder and bowel functions.  SNS can be used to treat disorders that make it hard to control urination and bowel movements. This information is not intended to replace advice given to you by your health care provider. Make sure you discuss any questions you have with your health care provider. Document Revised: 03/26/2020 Document Reviewed: 03/26/2020 [employer] Patient Education  2021 [employer]  

## 2020-12-21 NOTE — Progress Notes (Signed)
PTNS  Session # 5 of 12  Health & Social Factors: none Caffeine: 2 cups a day Alcohol: none Daytime voids #per day: many Night-time voids #per night: 1-2 Urgency: yes Incontinence Episodes #per day: all day and night Ankle used: left Treatment Setting: 7 Feeling/ Response: Positive sensation for heal to toe Comments: none  Performed By: Bridgette Habermann, Lpn   Assistant: none  Follow Up: Keep next scheduled NV

## 2020-12-27 ENCOUNTER — Other Ambulatory Visit: Payer: Self-pay

## 2020-12-27 ENCOUNTER — Ambulatory Visit: Payer: Medicare Other

## 2020-12-27 ENCOUNTER — Ambulatory Visit (INDEPENDENT_AMBULATORY_CARE_PROVIDER_SITE_OTHER): Payer: Medicare Other

## 2020-12-27 DIAGNOSIS — R339 Retention of urine, unspecified: Secondary | ICD-10-CM

## 2020-12-27 DIAGNOSIS — N3281 Overactive bladder: Secondary | ICD-10-CM | POA: Diagnosis not present

## 2020-12-27 NOTE — Progress Notes (Signed)
PTNS  Session #  6 of 12  Health & Social Factors: No Caffeine: 1 to  2 cups Alcohol: None Daytime voids #per day: Many Night-time voids #per night: Once Urgency: yes Incontinence Episodes #per day: all the time Ankle used: Right Treatment Setting: 7 Feeling/ Response: Good Comments: None  Performed By: Sarita Haver, Tena Linebaugh,LPN    Follow Up: 1 week

## 2021-01-03 ENCOUNTER — Other Ambulatory Visit: Payer: Self-pay

## 2021-01-03 ENCOUNTER — Ambulatory Visit (INDEPENDENT_AMBULATORY_CARE_PROVIDER_SITE_OTHER): Payer: Medicare Other

## 2021-01-03 DIAGNOSIS — N3281 Overactive bladder: Secondary | ICD-10-CM

## 2021-01-03 NOTE — Progress Notes (Signed)
PTNS  Session # 7 of 12  Health & Social Factors: None Caffeine: 1 t o 2 cups Alcohol: None Daytime voids #per day: Many Night-time voids #per night: 2 Urgency: yes Incontinence Episodes #per day: all day and night Ankle used: Left Treatment Setting: 6 Feeling/ Response: Good there Comments: None  Performed By: Dalia Heading.LPN    Follow Up:  1 week

## 2021-01-10 ENCOUNTER — Ambulatory Visit (INDEPENDENT_AMBULATORY_CARE_PROVIDER_SITE_OTHER): Payer: Medicare Other

## 2021-01-10 ENCOUNTER — Other Ambulatory Visit: Payer: Self-pay

## 2021-01-10 DIAGNOSIS — N3281 Overactive bladder: Secondary | ICD-10-CM | POA: Diagnosis not present

## 2021-01-10 NOTE — Progress Notes (Signed)
PTNS  Session # 8  Health & Social Factors: no Caffeine: 1-2 cups Alcohol: no Daytime voids #per day: "many" Night-time voids #per night: 1 or 2 Urgency: yes Incontinence Episodes #per day: "all day" Ankle used: right Treatment Setting: 14 Feeling/ Response: positive tingling sensation Comments: none  Performed By: Marchelle Folks RN  Assistant: none  Follow Up: weekly NV PTNS

## 2021-01-11 ENCOUNTER — Other Ambulatory Visit: Payer: Self-pay

## 2021-01-11 DIAGNOSIS — N3021 Other chronic cystitis with hematuria: Secondary | ICD-10-CM

## 2021-01-11 DIAGNOSIS — N3281 Overactive bladder: Secondary | ICD-10-CM

## 2021-01-11 MED ORDER — NITROFURANTOIN MACROCRYSTAL 50 MG PO CAPS: ORAL_CAPSULE | ORAL | 2 refills | Status: DC

## 2021-01-17 ENCOUNTER — Other Ambulatory Visit: Payer: Self-pay

## 2021-01-17 ENCOUNTER — Ambulatory Visit (INDEPENDENT_AMBULATORY_CARE_PROVIDER_SITE_OTHER): Payer: Medicare Other

## 2021-01-17 DIAGNOSIS — N3281 Overactive bladder: Secondary | ICD-10-CM

## 2021-01-17 NOTE — Progress Notes (Signed)
PTNS  Session # 9  Health & Social Factors: none Caffeine: 1-2 cups Alcohol: none Daytime voids #per day: many Night-time voids #per night: 1-2 Urgency: yes Incontinence Episodes #per day: all the time Ankle used: left Treatment Setting: 3 Feeling/ Response: strong sensation Comments: none  Performed By: Marchelle Folks RN  Assistant: none  Follow Up: weekly PTNS

## 2021-01-26 NOTE — Progress Notes (Signed)
PTNS  Session # 10 of 12  Health & Social Factors: none Caffeine: 1 to 2 cups Alcohol: none Daytime voids #per day: every 2 hrs Night-time voids #per night: 1-2 x's Urgency: yes Incontinence Episodes #per day: all day and night Ankle used: right Treatment Setting: 9 Feeling/ Response: positive sensation on the heel Comments: none  Performed By: Bridgette Habermann, LPN  Assistant: none  Follow Up: Keep next scheduled NV

## 2021-01-27 ENCOUNTER — Ambulatory Visit (INDEPENDENT_AMBULATORY_CARE_PROVIDER_SITE_OTHER): Payer: Medicare Other

## 2021-01-27 ENCOUNTER — Other Ambulatory Visit: Payer: Self-pay

## 2021-01-27 DIAGNOSIS — N3281 Overactive bladder: Secondary | ICD-10-CM

## 2021-01-27 NOTE — Patient Instructions (Addendum)
Percutaneous Nerve Evaluation for Sacral Nerve Stimulation  Sacral nerve stimulation (SNS) is a treatment that uses an implanted device that sends mild electrical impulses to the sacral nerves. The sacral nerves control several functions in the lower part of the body, including bladder and bowel functions. Sacral nerve stimulation can be used to treat disorders that make it hard to control urination and bowel movements. Before having a sacral nerve stimulator implanted, you may go through a trial known as a percutaneous nerve evaluation. This trial helps determine if SNS will help your condition. For the trial, an outpatient procedure will be done to insert temporary wires into your body. Electric pulses will travel through these wires (electrodes) to an area close to your sacral nerves. The electrodes are connected to the temporary nerve stimulator, which remains outside of your body. The nerve stimulator will send electric pulses during the trial period, which usually lasts 1-2 weeks. Tell a health care provider about:  Any allergies you have.  All medicines you are taking, including vitamins, herbs, eye drops, creams, and over-the-counter medicines.  Any problems you or family members have had with anesthetic medicines.  Any blood disorders you have.  Any surgeries you have had.  Any medical conditions you have.  Whether you are pregnant or may be pregnant. What are the risks? Generally, this is a safe procedure. However, problems may occur, including:  Movement of the electrode away from the place where it was inserted (migration).  Infection.  Bleeding.  Damage to nearby structures or organs, such as nerves near the spine.  Uncomfortable sensations, such as a jolting or shocking feeling.  Allergic reactions to medicines. What happens before the procedure? Medicines Ask your health care provider about:  Changing or stopping your regular medicines. This is especially important  if you are taking diabetes medicines or blood thinners.  Taking medicines such as aspirin and ibuprofen. These medicines can thin your blood. Do not take these medicines unless your health care provider tells you to take them.  Taking over-the-counter medicines, vitamins, herbs, and supplements. General instructions  Follow instructions from your health care provider about eating or drinking restrictions.  Ask your health care provider what steps will be taken to help prevent infection. These steps may include: ? Removing hair at the procedure site. ? Washing skin with a germ-killing soap. ? Taking antibiotic medicine. What happens during the procedure?  You will be given a medicine to numb the area (local anesthetic).  Long needles will be inserted into your lower back.  The needles will be guided to the place where the nerves exit the backbone. Your health care provider may use a type of X-ray (fluoroscopy) to guide the needles to the right spot.  The position of the needles will be tested. If they are in the right spot, your toes or feet may move. If you are awake, you may feel a tingling in your legs.  Electrodes will be inserted through the needles and into your body.  The electrodes will be anchored in place close to your sacral nerves.  The ends of the electrodes outside your body will be connected to a nerve stimulator device.  A bandage (dressing) will be applied to cover the electrodes.  Your health care provider will program the rate at which the nerve stimulator will deliver the electric pulses. The procedure may vary among health care providers and hospitals. What happens after the procedure?  Your blood pressure, heart rate, breathing rate, and blood oxygen   level will be monitored until you leave the hospital or clinic.  You will wear the sacral nerve stimulator for the trial period. You will be taught how to use the device.  You may be given pain medicine as  needed. Summary  Sacral nerve stimulation (SNS) is a treatment that uses an implanted device that sends mild electrical impulses to the sacral nerves.  This procedure will determine if a sacral nerve stimulator will help you. If so, you will need to have a second procedure to have a permanent device implanted.  The sacral nerves control several functions in the lower part of the body, including bladder and bowel functions.  SNS can be used to treat disorders that make it hard to control urination and bowel movements. This information is not intended to replace advice given to you by your health care provider. Make sure you discuss any questions you have with your health care provider. Document Revised: 03/26/2020 Document Reviewed: 03/26/2020 [employer] Patient Education  2021 [employer]  

## 2021-02-03 ENCOUNTER — Ambulatory Visit: Payer: Medicare Other

## 2021-02-03 ENCOUNTER — Ambulatory Visit (INDEPENDENT_AMBULATORY_CARE_PROVIDER_SITE_OTHER): Payer: Medicare Other

## 2021-02-03 ENCOUNTER — Other Ambulatory Visit: Payer: Self-pay

## 2021-02-03 DIAGNOSIS — N3281 Overactive bladder: Secondary | ICD-10-CM | POA: Diagnosis not present

## 2021-02-03 NOTE — Progress Notes (Addendum)
PTNS  Session # 11  Health & Social Factors: none Caffeine: 1-2 daily Alcohol: none Daytime voids #per day: many Night-time voids #per night: 1-2 Urgency: yes Incontinence Episodes #per day: all day/night Ankle used: right Treatment Setting: 5 Feeling/ Response: strong tingling sensation Comments:   Performed By: Marchelle Folks RN  Assistant:   Follow Up: 1 Month OV MD/PTNS

## 2021-02-04 ENCOUNTER — Encounter (HOSPITAL_COMMUNITY): Payer: Self-pay | Admitting: *Deleted

## 2021-02-04 ENCOUNTER — Emergency Department (HOSPITAL_COMMUNITY): Payer: Medicare Other

## 2021-02-04 ENCOUNTER — Other Ambulatory Visit: Payer: Self-pay

## 2021-02-04 ENCOUNTER — Observation Stay (HOSPITAL_COMMUNITY)
Admission: EM | Admit: 2021-02-04 | Discharge: 2021-02-05 | Disposition: A | Discharge status: Home / Self Care | Payer: Medicare Other | Attending: Internal Medicine | Admitting: Internal Medicine

## 2021-02-04 DIAGNOSIS — Z91040 Latex allergy status: Secondary | ICD-10-CM | POA: Insufficient documentation

## 2021-02-04 DIAGNOSIS — Z79899 Other long term (current) drug therapy: Secondary | ICD-10-CM | POA: Insufficient documentation

## 2021-02-04 DIAGNOSIS — Z20822 Contact with and (suspected) exposure to covid-19: Secondary | ICD-10-CM | POA: Insufficient documentation

## 2021-02-04 DIAGNOSIS — I4891 Unspecified atrial fibrillation: Secondary | ICD-10-CM | POA: Diagnosis not present

## 2021-02-04 DIAGNOSIS — J90 Pleural effusion, not elsewhere classified: Secondary | ICD-10-CM | POA: Diagnosis not present

## 2021-02-04 DIAGNOSIS — K589 Irritable bowel syndrome without diarrhea: Secondary | ICD-10-CM | POA: Diagnosis not present

## 2021-02-04 DIAGNOSIS — K219 Gastro-esophageal reflux disease without esophagitis: Secondary | ICD-10-CM | POA: Diagnosis not present

## 2021-02-04 DIAGNOSIS — F419 Anxiety disorder, unspecified: Secondary | ICD-10-CM | POA: Diagnosis present

## 2021-02-04 DIAGNOSIS — Z7901 Long term (current) use of anticoagulants: Secondary | ICD-10-CM | POA: Insufficient documentation

## 2021-02-04 DIAGNOSIS — J449 Chronic obstructive pulmonary disease, unspecified: Secondary | ICD-10-CM | POA: Diagnosis not present

## 2021-02-04 DIAGNOSIS — J9811 Atelectasis: Secondary | ICD-10-CM | POA: Diagnosis not present

## 2021-02-04 DIAGNOSIS — J439 Emphysema, unspecified: Secondary | ICD-10-CM | POA: Diagnosis not present

## 2021-02-04 DIAGNOSIS — N390 Urinary tract infection, site not specified: Secondary | ICD-10-CM | POA: Insufficient documentation

## 2021-02-04 DIAGNOSIS — I4892 Unspecified atrial flutter: Secondary | ICD-10-CM

## 2021-02-04 DIAGNOSIS — R002 Palpitations: Secondary | ICD-10-CM | POA: Diagnosis present

## 2021-02-04 LAB — CBC
HCT: 37.3 % (ref 36.0–46.0)
Hemoglobin: 11.6 g/dL — ABNORMAL LOW (ref 12.0–15.0)
MCH: 27 pg (ref 26.0–34.0)
MCHC: 31.1 g/dL (ref 30.0–36.0)
MCV: 86.7 fL (ref 80.0–100.0)
Platelets: 309 10*3/uL (ref 150–400)
RBC: 4.3 MIL/uL (ref 3.87–5.11)
RDW: 16.3 % — ABNORMAL HIGH (ref 11.5–15.5)
WBC: 8.8 10*3/uL (ref 4.0–10.5)
nRBC: 0 % (ref 0.0–0.2)

## 2021-02-04 LAB — BASIC METABOLIC PANEL
Anion gap: 6 (ref 5–15)
BUN: 24 mg/dL — ABNORMAL HIGH (ref 8–23)
CO2: 23 mmol/L (ref 22–32)
Calcium: 8.6 mg/dL — ABNORMAL LOW (ref 8.9–10.3)
Chloride: 105 mmol/L (ref 98–111)
Creatinine, Ser: 1.02 mg/dL — ABNORMAL HIGH (ref 0.44–1.00)
GFR, Estimated: 53 mL/min — ABNORMAL LOW (ref >60)
Glucose, Bld: 105 mg/dL — ABNORMAL HIGH (ref 70–99)
Potassium: 4.1 mmol/L (ref 3.5–5.1)
Sodium: 134 mmol/L — ABNORMAL LOW (ref 135–145)

## 2021-02-04 LAB — MAGNESIUM: Magnesium: 2.1 mg/dL (ref 1.7–2.4)

## 2021-02-04 LAB — RESP PANEL BY RT-PCR (FLU A&B, COVID) ARPGX2
Influenza A by PCR: NEGATIVE
Influenza B by PCR: NEGATIVE
SARS Coronavirus 2 by RT PCR: NEGATIVE

## 2021-02-04 LAB — TROPONIN I (HIGH SENSITIVITY)
Troponin I (High Sensitivity): 9 ng/L (ref <18)
Troponin I (High Sensitivity): 9 ng/L (ref <18)

## 2021-02-04 MED ORDER — ETOMIDATE 2 MG/ML IV SOLN: 0.1500 mg/kg | Freq: Once | INTRAVENOUS | Status: AC

## 2021-02-04 MED ORDER — ETOMIDATE 2 MG/ML IV SOLN
INTRAVENOUS | Status: AC
  Administered 2021-02-04: 8.5 mg via INTRAVENOUS
  Filled 2021-02-04: qty 10

## 2021-02-04 MED ORDER — DILTIAZEM HCL-DEXTROSE 125-5 MG/125ML-% IV SOLN (PREMIX)
5.0000 mg/h | INTRAVENOUS | Status: DC
  Administered 2021-02-04: 5 mg/h via INTRAVENOUS
  Filled 2021-02-04: qty 125

## 2021-02-04 MED ORDER — SODIUM CHLORIDE 0.9 % IV BOLUS
500.0000 mL | Freq: Once | INTRAVENOUS | Status: AC
  Administered 2021-02-04: 500 mL via INTRAVENOUS

## 2021-02-04 NOTE — ED Triage Notes (Signed)
Referred from PCP for treatment of afib

## 2021-02-04 NOTE — Sedation Documentation (Signed)
120 Joules at 2057 150 joules at 2058 200 joules at 2059 Dr. Effie Shy at bedside, pt remains in Afib

## 2021-02-04 NOTE — ED Provider Notes (Addendum)
[facility] EMERGENCY DEPARTMENT Provider Note   CSN: [id number] Arrival date & time: 02/04/21  1517     History Chief Complaint  Patient presents with   Atrial Fibrillation    Diana Mata is a 87 y.o. female.  HPI Patient here for evaluation of general weakness, malaise and found to have rapid heartbeat today when at a doctor's office visit.  She was therefore sent here for further treatment.  She is not having chest pain, shortness of breath, coughing, focal weakness or paresthesia.  All history is from the patient's daughter who is her caregiver.  Patient has not been ill recently.  She is taking all of her medicines including Eliquis which she takes because of atrial flutter.  Her daughter does not know of any episodes of atrial flutter within the last several years.  There are no other known active modifying factors.    Past Medical History:  Diagnosis Date   Atrial flutter (HCC)    Chronic UTI    COPD (chronic obstructive pulmonary disease) (HCC)    Dizziness    GERD (gastroesophageal reflux disease)    IBS (irritable bowel syndrome)    Renal disorder    renal insufficiency    Patient Active Problem List   Diagnosis Date Noted   Atrial fibrillation with RVR (HCC) 02/04/2021   Chronic cystitis with hematuria 09/24/2019   OAB (overactive bladder) 09/24/2019   Fall 06/06/2018   Hypotension 06/06/2018   UTI (urinary tract infection) 06/06/2018   Elevated troponin 06/06/2018   Degenerative disc disease, cervical 06/06/2018   Normocytic anemia 06/06/2018   AKI (acute kidney injury) (HCC) 06/06/2018   GERD (gastroesophageal reflux disease) 06/06/2018   Atrial flutter (HCC) 06/06/2018   Anxiety 06/06/2018   Dehydration     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     JOINT REPLACEMENT       OB History   No obstetric history on file.     Family History  Problem Relation Age of Onset    Hypertension Mother     Social History   Tobacco Use   Smoking status: Never Smoker   Smokeless tobacco: Never Used  Vaping Use   Vaping Use: Never used  Substance Use Topics   Alcohol use: Never    Comment: occ   Drug use: Never    Home Medications Prior to Admission medications   Medication Sig Start Date End Date Taking? Authorizing Provider  albuterol (PROVENTIL HFA;VENTOLIN HFA) 108 (90 Base) MCG/ACT inhaler Inhale 1-2 puffs into the lungs every 6 (six) hours as needed for wheezing or shortness of breath.   Yes [provider]  diazepam (VALIUM) 5 MG tablet Take 5-10 mg by mouth every 6 (six) hours as needed for anxiety.   Yes [provider]  escitalopram (LEXAPRO) 5 MG tablet Take 5 mg by mouth daily. 11/20/18  Yes [provider]  fluticasone (FLONASE) 50 MCG/ACT nasal spray Place 1 spray into both nostrils daily. 09/25/19  Yes [provider]  HYDROcodone-acetaminophen (NORCO/VICODIN) 5-325 MG tablet Take 1 tablet by mouth 2 (two) times daily.   Yes [provider]  Iron-FA-B Cmp-C-Biot-Probiotic (FUSION PLUS PO) Take 1 capsule by mouth daily.   Yes [provider]  loperamide (IMODIUM) 2 MG capsule Take 2 mg by mouth as needed for diarrhea or loose stools.   Yes [provider]  meclizine (ANTIVERT) 25 MG tablet Take 25 mg  by mouth every morning. 09/25/19  Yes [provider]  nitrofurantoin (MACRODANTIN) 50 MG capsule TAKE ONE CAPSULE BY MOUTH EVERYDAY AT BEDTIME 01/11/21  Yes McKenzie, Mardene Celeste, MD  omeprazole (PRILOSEC) 20 MG capsule Take 20 mg by mouth daily.   Yes [provider]  ondansetron (ZOFRAN) 4 MG tablet Take 4 mg by mouth every 6 (six) hours as needed. for nausea 09/27/18  Yes [provider]  RESTASIS 0.05 % ophthalmic emulsion Place 1 drop into both eyes 2 (two) times daily. 09/16/18  Yes [provider]  Rivaroxaban (XARELTO) 15 MG TABS tablet Take 1 tablet (15 mg  total) by mouth every morning. Patient taking differently: Take 15 mg by mouth every evening. 09/12/18  Yes Strader, Lennart Pall, PA-C  traZODone (DESYREL) 50 MG tablet Take 50 mg by mouth at bedtime.   Yes [provider]  Trospium Chloride 60 MG CP24 Take 1 capsule (60 mg total) by mouth at bedtime. 12/21/20  Yes McKenzie, Mardene Celeste, MD  amoxicillin-clavulanate (AUGMENTIN) 875-125 MG tablet Take 1 tablet by mouth every 12 (twelve) hours. Patient not taking: No sig reported 08/24/20   Malen Gauze, MD  diltiazem (CARDIZEM) 30 MG tablet Take 1 tablet by mouth twice daily Patient not taking: No sig reported 04/20/20   Ellsworth Lennox, PA-C  Multiple Vitamins-Minerals (PRESERVISION AREDS PO) Take 1 tablet by mouth daily.    [provider]  MYRBETRIQ 50 MG TB24 tablet Take 1 tablet (50 mg total) by mouth daily. 11/15/20   McKenzie, Mardene Celeste, MD    Allergies    Levaquin [levofloxacin in d5w], Penicillins, Sulfa antibiotics, Tequin [gatifloxacin], and Latex  Review of Systems   Review of Systems  All other systems reviewed and are negative.  Physical Exam Updated Vital Signs BP (!) 142/93   Pulse (!) 116   Temp 97.6 F (36.4 C) (Oral)   Resp 12   Ht  (1.626 m)   Wt 56.7 kg   SpO2 100%   BMI 21.46 kg/m   Physical Exam Vitals and nursing note reviewed.  Constitutional:      Appearance: Normal appearance. She is well-developed. She is not ill-appearing.     Comments: Elderly, frail  HENT:     Head: Normocephalic and atraumatic.     Right Ear: External ear normal.     Left Ear: External ear normal.  Eyes:     Conjunctiva/sclera: Conjunctivae normal.     Pupils: Pupils are equal, round, and reactive to light.  Neck:     Trachea: Phonation normal.  Cardiovascular:     Rate and Rhythm: Tachycardia present. Rhythm irregular.     Heart sounds: Normal heart sounds.  Pulmonary:     Effort: Pulmonary effort is normal. No respiratory distress.     Breath  sounds: Normal breath sounds. No stridor.  Abdominal:     General: There is no distension.     Palpations: Abdomen is soft.     Tenderness: There is no abdominal tenderness.  Musculoskeletal:        General: Normal range of motion.     Cervical back: Normal range of motion and neck supple.  Skin:    General: Skin is warm and dry.  Neurological:     Mental Status: She is alert and oriented to person, place, and time.     Cranial Nerves: No cranial nerve deficit.     Sensory: No sensory deficit.     Motor: No abnormal muscle  tone.     Coordination: Coordination normal.  Psychiatric:        Mood and Affect: Mood normal.        Behavior: Behavior normal.        Thought Content: Thought content normal.        Judgment: Judgment normal.    ED Results / Procedures / Treatments   Labs (all labs ordered are listed, but only abnormal results are displayed) Labs Reviewed  BASIC METABOLIC PANEL - Abnormal; Notable for the following components:      Result Value   Sodium 134 (*)    Glucose, Bld 105 (*)    BUN 24 (*)    Creatinine, Ser 1.02 (*)    Calcium 8.6 (*)    GFR, Estimated 53 (*)    All other components within normal limits  CBC - Abnormal; Notable for the following components:   Hemoglobin 11.6 (*)    RDW 16.3 (*)    All other components within normal limits  RESP PANEL BY RT-PCR (FLU A&B, COVID) ARPGX2  MAGNESIUM  TROPONIN I (HIGH SENSITIVITY)  TROPONIN I (HIGH SENSITIVITY)    EKG EKG Interpretation  Date/Time:  Friday February 04 2021 17:21:05 EDT Ventricular Rate:  121 PR Interval:    QRS Duration: 122 QT Interval:  352 QTC Calculation: 500 R Axis:   47 Text Interpretation: Atrial fibrillation Left bundle branch block Since last tracing of earlier today Rate slower Otherwise no significant change Confirmed by Mancel Bale [phone]) on 02/04/2021 5:27:42 PM   Radiology DG Chest 2 View  Result Date: 02/04/2021 CLINICAL DATA:  Atrial fibrillation, irregular heart  rate today, history COPD EXAM: CHEST - 2 VIEW COMPARISON:  06/06/2018 FINDINGS: Normal heart size, mediastinal contours, and pulmonary vascularity. Atherosclerotic calcification aorta. Emphysematous and bronchitic changes consistent with COPD. Mild RIGHT basilar atelectasis and tiny bibasilar pleural effusions. No acute infiltrate or pneumothorax. Bones demineralized. IMPRESSION: COPD changes with tiny bibasilar pleural effusions and mild RIGHT basilar atelectasis. Aortic Atherosclerosis (ICD10-I70.0) and Emphysema (ICD10-J43.9). Electronically Signed   By: Ulyses Southward M.D.   On: 02/04/2021 17:12    Procedures .Cardioversion  Date/Time: 02/04/2021 9:20 PM Performed by: Mancel Bale, MD Authorized by: Mancel Bale, MD   Consent:    Consent obtained:  Written   Consent given by:  Patient   Risks discussed:  Cutaneous burn and induced arrhythmia Universal protocol:    Immediately prior to procedure a time out was called: yes   Pre-procedure details:    Cardioversion basis:  Elective   Rhythm:  Atrial flutter   Electrode placement:  Anterior-posterior Patient sedated: Yes. Refer to sedation procedure documentation for details of sedation.  Attempt one:    Cardioversion mode:  Synchronous   Waveform:  Biphasic   Shock (Joules):  120   Shock outcome:  No change in rhythm Attempt two:    Cardioversion mode:  Synchronous   Shock (Joules):  150   Shock outcome:  No change in rhythm Attempt three:    Cardioversion mode:  Synchronous   Shock (Joules):  200   Shock outcome:  No change in rhythm Post-procedure details:    Patient status:  Awake   Patient tolerance of procedure:  Tolerated well, no immediate complications .Sedation  Date/Time: 02/04/2021 9:35 PM Performed by: Mancel Bale, MD Authorized by: Mancel Bale, MD   Consent:    Consent obtained:  Written   Consent given by:  Patient   Risks discussed:  Allergic reaction, inadequate sedation, nausea  and vomiting    Alternatives discussed:  Analgesia without sedation Universal protocol:    Immediately prior to procedure, a time out was called: yes   Indications:    Procedure performed:  Cardioversion Pre-sedation assessment:    Time since last food or drink:  6 hour   ASA classification: class 3 - patient with severe systemic disease     Mouth opening:  3 or more finger widths   Mallampati score:  III - soft palate, base of uvula visible   Neck mobility: normal     Pre-sedation assessments completed and reviewed: airway patency, cardiovascular function, hydration status and mental status     Pre-sedation assessments completed and reviewed: pre-procedure nausea and vomiting status not reviewed and pre-procedure pain level not reviewed   Immediate pre-procedure details:    Reassessment: Patient reassessed immediately prior to procedure     Reviewed: vital signs, relevant labs/tests and NPO status     Verified: bag valve mask available, emergency equipment available, intubation equipment available, IV patency confirmed and oxygen available   Procedure details (see MAR for exact dosages):    Preoxygenation:  Nasal cannula   Sedation:  Etomidate   Intended level of sedation: deep   Intra-procedure monitoring:  Blood pressure monitoring, continuous capnometry, continuous pulse oximetry, cardiac monitor, frequent LOC assessments and frequent vital sign checks   Intra-procedure events: none     Total Provider sedation time (minutes):  20 Post-procedure details:    Post-sedation assessment completed:  02/04/2021 9:36 PM   Attendance: Constant attendance by certified staff until patient recovered     Recovery: Patient returned to pre-procedure baseline     Post-sedation assessments completed and reviewed: airway patency, cardiovascular function, hydration status and mental status     Post-sedation assessments completed and reviewed: post-procedure nausea and vomiting status not reviewed and pain score not  reviewed     Patient is stable for discharge or admission: yes     Procedure completion:  Tolerated well, no immediate complications .Critical Care Performed by: Mancel BaleWentz, Saray Capasso, MD Authorized by: Mancel BaleWentz, Dai Mcadams, MD   Critical care provider statement:    Critical care time (minutes):  50   Critical care start time:  02/04/2021 4:50 PM   Critical care end time:  02/04/2021 9:37 PM   Critical care time was exclusive of:  Separately billable procedures and treating other patients   Critical care was necessary to treat or prevent imminent or life-threatening deterioration of the following conditions:  Cardiac failure   Critical care was time spent personally by me on the following activities:  Blood draw for specimens, development of treatment plan with patient or surrogate, discussions with consultants, evaluation of patient's response to treatment, examination of patient, obtaining history from patient or surrogate, ordering and performing treatments and interventions, ordering and review of laboratory studies, pulse oximetry, re-evaluation of patient's condition, review of old charts and ordering and review of radiographic studies   Medications Ordered in ED Medications  diltiazem (CARDIZEM) 125 mg in dextrose 5% 125 mL (1 mg/mL) infusion (5 mg/hr Intravenous New Bag/Given 02/04/21 2131)  sodium chloride 0.9 % bolus 500 mL (0 mLs Intravenous Stopped 02/04/21 1822)  etomidate (AMIDATE) injection 8.5 mg (8.5 mg Intravenous Given 02/04/21 2055)    ED Course  I have reviewed the triage vital signs and the nursing notes.  Pertinent labs & imaging results that were available during my care of the patient were reviewed by me and considered in my medical decision making (see chart  for details).    MDM Rules/Calculators/A&P                           Patient Vitals for the past 24 hrs:  BP Temp Temp src Pulse Resp SpO2 Height Weight  02/04/21 2132 -- -- -- (!) 116 12 100 % -- --  02/04/21 2116 -- -- --  (!) 130 16 100 % -- --  02/04/21 2115 (!) 142/93 -- -- (!) 127 15 100 % -- --  02/04/21 2114 -- -- -- (!) 129 20 100 % -- --  02/04/21 2113 -- -- -- (!) 131 18 100 % -- --  02/04/21 2112 -- -- -- (!) 127 20 100 % -- --  02/04/21 2111 -- -- -- (!) 123 (!) 23 100 % -- --  02/04/21 2110 (!) 143/78 -- -- (!) 128 20 100 % -- --  02/04/21 2109 -- -- -- (!) 129 18 100 % -- --  02/04/21 2108 -- -- -- (!) 123 19 100 % -- --  02/04/21 2107 -- -- -- (!) 125 20 100 % -- --  02/04/21 2106 -- -- -- (!) 126 19 100 % -- --  02/04/21 2105 (!) 146/91 -- -- (!) 127 19 100 % -- --  02/04/21 2104 -- -- -- (!) 130 19 100 % -- --  02/04/21 2103 -- -- -- (!) 113 19 100 % -- --  02/04/21 2102 -- -- -- (!) 125 18 100 % -- --  02/04/21 2101 (!) 169/95 -- -- (!) 112 20 100 % -- --  02/04/21 2100 -- -- -- (!) 125 20 100 % -- --  02/04/21 2059 -- -- -- (!) 130 (!) 21 100 % -- --  02/04/21 2058 -- -- -- (!) 123 (!) 23 100 % -- --  02/04/21 2057 -- -- -- (!) 120 (!) 31 100 % -- --  02/04/21 2056 -- -- -- (!) 115 (!) 25 100 % -- --  02/04/21 2055 125/74 -- -- (!) 133 (!) 27 100 % -- --  02/04/21 2050 -- -- -- (!) 121 19 100 % -- --  02/04/21 2047 -- -- -- (!) 123 13 100 % -- --  02/04/21 2046 -- -- -- (!) 125 (!) 22 100 % -- --  02/04/21 2044 -- -- -- (!) 123 18 97 % -- --  02/04/21 2043 -- -- -- -- -- -- 5\' 4"  (1.626 m) --  02/04/21 2042 -- -- -- (!) 117 19 99 % -- --  02/04/21 2038 -- -- -- (!) 122 19 99 % -- --  02/04/21 2030 132/78 -- -- (!) 121 18 99 % -- --  02/04/21 2020 -- -- -- -- -- -- -- 56.7 kg  02/04/21 2015 -- -- -- (!) 119 17 99 % -- --  02/04/21 2000 124/80 -- -- (!) 118 15 99 % -- --  02/04/21 1945 -- -- -- (!) 119 20 100 % -- --  02/04/21 1930 118/61 -- -- (!) 123 15 99 % -- --  02/04/21 1915 -- -- -- (!) 125 (!) 22 99 % -- --  02/04/21 1900 (!) 114/91 -- -- (!) 118 (!) 24 98 % -- --  02/04/21 1852 -- -- -- (!) 116 17 99 % -- --  02/04/21 1845 -- -- -- -- (!) 37 -- -- --  02/04/21 1842 -- --  -- (!) 115 19 100 % -- --  02/04/21  1830 117/78 -- -- (!) 122 19 97 % -- --  02/04/21 1815 -- -- -- (!) 122 18 99 % -- --  02/04/21 1800 123/75 -- -- (!) 122 18 98 % -- --  02/04/21 1745 -- -- -- (!) 109 (!) 23 99 % -- --  02/04/21 1740 -- -- -- (!) 119 16 100 % -- --  02/04/21 1730 (!) 144/85 -- -- (!) 118 (!) 26 100 % -- --  02/04/21 1729 123/72 -- -- (!) 118 20 100 % -- --  02/04/21 1725 -- -- -- (!) 118 11 99 % -- --  02/04/21 1715 -- -- -- (!) 129 18 97 % -- --  02/04/21 1700 123/72 -- -- (!) 120 18 99 % -- --  02/04/21 1646 -- -- -- (!) 115 -- 99 % -- --  02/04/21 1645 131/86 -- -- -- -- -- -- --  02/04/21 1605 112/70 97.6 F (36.4 C) Oral (!) 123 18 92 % -- --    9:39 PM Reevaluation with update and discussion. After initial assessment and treatment, an updated evaluation reveals she is recovered her preprocedure baseline.  Findings discussed with patient and daughter, questions. Mancel Bale   Medical Decision Making:  This patient is presenting for evaluation of rapid heart beating, which does require a range of treatment options, and is a complaint that involves a high risk of morbidity and mortality. The differential diagnoses include atrial fibrillation, atrial flutter, sinus tachycardia, acute illness, metabolic disorder. I decided to review old records, and in summary elderly female presenting anticoagulated with rapid heartbeat, and an EKG showing atrial flutter.  I obtained additional historical information from daughter at bedside.  Clinical Laboratory Tests Ordered, included CBC, Metabolic panel and Troponin . Review indicates normal except sodium low, glucose high, BUN high, creatinine high, calcium low, GFR low, hemoglobin low. Radiologic Tests Ordered, included chest x-ray.  I independently Visualized: Radiographic images, which show no acute changes  Cardiac Monitor Tracing which shows rapid atrial flutter   Critical Interventions-clinical evaluation, laboratory  testing, IV fluids, radiography, observation, DC synchronized cardioversion, reassessment, Cardizem started  After These Interventions, the Patient was reevaluated and was found with persistent atrial flutter with rapid ventricular response, requiring hospitalization after failed DC cardioversion.  Patient is already anticoagulated.  Doubt ACS, metabolic disorder or occult infection.  CRITICAL CARE-yes Performed by: Mancel Bale  Nursing Notes Reviewed/ Care Coordinated Applicable Imaging Reviewed Interpretation of Laboratory Data incorporated into ED treatment   9:39 PM-Consult complete with hospitalist. Patient case explained and discussed.  He agrees to admit patient for further evaluation and treatment. Call ended at 9:34 PM    Final Clinical Impression(s) / ED Diagnoses Final diagnoses:  Atrial flutter with rapid ventricular response [facility])    Rx / DC Orders ED Discharge Orders     None        Mancel Bale, MD 02/04/21 2139    Mancel Bale, MD 02/14/21 1755

## 2021-02-04 NOTE — ED Provider Notes (Signed)
Emergency Medicine Provider Triage Evaluation Note  Diana Mata , a 87 y.o. female  was evaluated in triage.  Pt complains of afib. 2 days of nausea and racing heart. She was seen at Docs office and confirmed afib w/ rvr. Feeling nauseated w/ weakness and sob. Sent in. Hx of same- on xarelto.  Review of Systems  Positive: Afib/ RVR Negative: melena  Physical Exam  BP 112/70 (BP Location: Right Arm)   Pulse (!) 123   Temp 97.6 F (36.4 C) (Oral)   Resp 18   SpO2 92%  Gen:   Awake, no distress   Resp:  Normal effort  MSK:   Moves extremities without difficulty  Other:  Tachycardia  Medical Decision Making  Medically screening exam initiated at 4:18 PM.  Appropriate orders placed.  Diana Mata was informed that the remainder of the evaluation will be completed by another provider, this initial triage assessment does not replace that evaluation, and the importance of remaining in the ED until their evaluation is complete.  Pt. W/ afib w/ rvr. Needs a bed   Arthor Captain, PA-C 02/04/21 1627    Mancel Bale, MD 02/05/21 [phone]

## 2021-02-04 NOTE — H&P (Signed)
History and Physical  Diana Mata QPY:[id number] DOB: 1930/09/12 DOA: 02/04/2021  Referring physician: Dr Diana Mata, ED physician PCP: Diana Stabile, MD  Outpatient Specialists:   Patient Coming From: home  Chief Complaint: weakness, rapid heartrate  HPI: Diana Mata is a 87 y.o. female with a history of GERD, COPD, atrial flutter on chronic anticoagulation, IBS UTI.  Patient seen for weakness and racing heart rate for the past 2 days that has been increasing.  She was seen at her PCPs office who confirmed A. fib with RVR, patient was sent to the hospital.  She has a history of A. fib/a flutter and is on Xarelto.  No palliating provoking factors.   Emergency Department Course: IV fluid bolus given without improvement.  Cardioversion attempted without improvement.  Patient started on Cardizem drip with the admitted to stepdown. Troponins are negative.  Review of Systems:   Pt denies any fevers, chills, nausea, vomiting, diarrhea, constipation, abdominal pain, shortness of breath, dyspnea on exertion, orthopnea, cough, wheezing, palpitations, headache, vision changes, lightheadedness, dizziness, melena, rectal bleeding.  Review of systems are otherwise negative  Past Medical History:  Diagnosis Date  . Atrial flutter (HCC)   . Chronic UTI   . COPD (chronic obstructive pulmonary disease) (HCC)   . Dizziness   . GERD (gastroesophageal reflux disease)   . IBS (irritable bowel syndrome)   . Renal disorder    renal insufficiency   Past Surgical History:  Procedure Laterality Date  . ABDOMINAL HYSTERECTOMY    . 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 JOINT REPLACEMENT     Social History:  reports that she has never smoked. She has never used smokeless tobacco. She reports that she does not drink alcohol and does not use drugs. Patient lives at home  Allergies  Allergen Reactions  . Levaquin [Levofloxacin In D5w] Itching    Lower extremities  . Penicillins Nausea Only    Has  patient had a PCN reaction causing immediate rash, facial/tongue/throat swelling, SOB or lightheadedness with hypotension: No Has patient had a PCN reaction causing severe rash involving mucus membranes or skin necrosis: No Has patient had a PCN reaction that required hospitalization: No Has patient had a PCN reaction occurring within the last 10 years: No If all of the above answers are "NO", then may proceed with Cephalosporin use.   . Sulfa Antibiotics Hives  . Tequin [Gatifloxacin] Itching    Lower extremities  . Latex Itching and Rash    Family History  Problem Relation Age of Onset  . Hypertension Mother       Prior to Admission medications   Medication Sig Start Date End Date Taking? Authorizing Provider  albuterol (PROVENTIL HFA;VENTOLIN HFA) 108 (90 Base) MCG/ACT inhaler Inhale 1-2 puffs into the lungs every 6 (six) hours as needed for wheezing or shortness of breath.   Yes [provider]  diazepam (VALIUM) 5 MG tablet Take 5-10 mg by mouth every 6 (six) hours as needed for anxiety.   Yes [provider]  escitalopram (LEXAPRO) 5 MG tablet Take 5 mg by mouth daily. 11/20/18  Yes [provider]  fluticasone (FLONASE) 50 MCG/ACT nasal spray Place 1 spray into both nostrils daily. 09/25/19  Yes [provider]  HYDROcodone-acetaminophen (NORCO/VICODIN) 5-325 MG tablet Take 1 tablet by mouth 2 (two) times daily.   Yes [provider]  Iron-FA-B Cmp-C-Biot-Probiotic (FUSION PLUS PO) Take 1 capsule by mouth daily.   Yes [provider]  loperamide (IMODIUM) 2  MG capsule Take 2 mg by mouth as needed for diarrhea or loose stools.   Yes [provider]  meclizine (ANTIVERT) 25 MG tablet Take 25 mg by mouth every morning. 09/25/19  Yes [provider]  nitrofurantoin (MACRODANTIN) 50 MG capsule TAKE ONE CAPSULE BY MOUTH EVERYDAY AT BEDTIME 01/11/21  Yes Mata, Diana Celeste, MD  omeprazole (PRILOSEC) 20 MG capsule Take 20  mg by mouth daily.   Yes [provider]  ondansetron (ZOFRAN) 4 MG tablet Take 4 mg by mouth every 6 (six) hours as needed. for nausea 09/27/18  Yes [provider]  RESTASIS 0.05 % ophthalmic emulsion Place 1 drop into both eyes 2 (two) times daily. 09/16/18  Yes [provider]  Rivaroxaban (XARELTO) 15 MG TABS tablet Take 1 tablet (15 mg total) by mouth every morning. Patient taking differently: Take 15 mg by mouth every evening. 09/12/18  Yes Mata, Diana Pall, PA-C  traZODone (DESYREL) 50 MG tablet Take 50 mg by mouth at bedtime.   Yes [provider]  Trospium Chloride 60 MG CP24 Take 1 capsule (60 mg total) by mouth at bedtime. 12/21/20  Yes Mata, Diana Celeste, MD  amoxicillin-clavulanate (AUGMENTIN) 875-125 MG tablet Take 1 tablet by mouth every 12 (twelve) hours. Patient not taking: No sig reported 08/24/20   Diana Gauze, MD  diltiazem (CARDIZEM) 30 MG tablet Take 1 tablet by mouth twice daily Patient not taking: No sig reported 04/20/20   Diana Lennox, PA-C  Multiple Vitamins-Minerals (PRESERVISION AREDS PO) Take 1 tablet by mouth daily.    [provider]  MYRBETRIQ 50 MG TB24 tablet Take 1 tablet (50 mg total) by mouth daily. 11/15/20   Diana Gauze, MD    Physical Exam: BP (!) 142/93   Pulse (!) 116   Temp 97.6 F (36.4 C) (Oral)   Resp 12   Ht 5\' 4"  (1.626 m)   Wt 56.7 kg   SpO2 100%   BMI 21.46 kg/m   . General: Elderly female. Awake and alert and oriented x3. No acute cardiopulmonary distress.  HEENT: Normocephalic atraumatic.  Right and left ears normal in appearance.  Pupils equal, round, reactive to light. Extraocular muscles are intact. Sclerae anicteric and noninjected.  Moist mucosal membranes. No mucosal lesions.  . Neck: Neck supple without lymphadenopathy. No carotid bruits. No masses palpated.  . Cardiovascular: Irregularly irregular rate. No murmurs, rubs, gallops auscultated. No JVD.   Marland Kitchen Respiratory: Good respiratory effort with no wheezes, rales, rhonchi. Lungs clear to auscultation bilaterally.  No accessory muscle use. . Abdomen: Soft, nontender, nondistended. Active bowel sounds. No masses or hepatosplenomegaly  . Skin: No rashes, lesions, or ulcerations.  Dry, warm to touch. 2+ dorsalis pedis and radial pulses. . Musculoskeletal: No calf or leg pain. All major joints not erythematous nontender.  No upper or lower joint deformation.  Good ROM.  No contractures  . Psychiatric: Intact judgment and insight. Pleasant and cooperative. . Neurologic: No focal neurological deficits. Strength is 5/5 and symmetric in upper and lower extremities.  Cranial nerves II through XII are grossly intact.           Labs on Admission: I have personally reviewed following labs and imaging studies  CBC: Recent Labs  Lab 02/04/21 1631  WBC 8.8  HGB 11.6*  HCT 37.3  MCV 86.7  PLT 309   Basic Metabolic Panel: Recent Labs  Lab 02/04/21 1624 02/04/21 1631  NA  --  134*  K  --  4.1  CL  --  105  CO2  --  23  GLUCOSE  --  105*  BUN  --  24*  CREATININE  --  1.02*  CALCIUM  --  8.6*  MG 2.1  --    GFR: Estimated Creatinine Clearance: 33.6 mL/min (A) (by C-G formula based on SCr of 1.02 mg/dL (H)). Liver Function Tests: No results for input(s): AST, ALT, ALKPHOS, BILITOT, PROT, ALBUMIN in the last 168 hours. No results for input(s): LIPASE, AMYLASE in the last 168 hours. No results for input(s): AMMONIA in the last 168 hours. Coagulation Profile: No results for input(s): INR, PROTIME in the last 168 hours. Cardiac Enzymes: No results for input(s): CKTOTAL, CKMB, CKMBINDEX, TROPONINI in the last 168 hours. BNP (last 3 results) No results for input(s): PROBNP in the last 8760 hours. HbA1C: No results for input(s): HGBA1C in the last 72 hours. CBG: No results for input(s): GLUCAP in the last 168 hours. Lipid Profile: No results for input(s): CHOL, HDL, LDLCALC, TRIG,  CHOLHDL, LDLDIRECT in the last 72 hours. Thyroid Function Tests: No results for input(s): TSH, T4TOTAL, FREET4, T3FREE, THYROIDAB in the last 72 hours. Anemia Panel: No results for input(s): VITAMINB12, FOLATE, FERRITIN, TIBC, IRON, RETICCTPCT in the last 72 hours. Urine analysis:    Component Value Date/Time   COLORURINE YELLOW 11/27/2018 1535   APPEARANCEUR Hazy (A) 11/16/2020 1342   LABSPEC 1.017 11/27/2018 1535   PHURINE 5.0 11/27/2018 1535   GLUCOSEU Negative 11/16/2020 1342   HGBUR NEGATIVE 11/27/2018 1535   BILIRUBINUR Negative 11/16/2020 1342   KETONESUR NEGATIVE 11/27/2018 1535   PROTEINUR Negative 11/16/2020 1342   PROTEINUR 100 (A) 11/27/2018 1535   UROBILINOGEN negative (A) 09/24/2019 1629   NITRITE Positive (A) 11/16/2020 1342   NITRITE NEGATIVE 11/27/2018 1535   LEUKOCYTESUR 3+ (A) 11/16/2020 1342   LEUKOCYTESUR MODERATE (A) 11/27/2018 1535   Sepsis Labs: @LABRCNTIP (procalcitonin:4,lacticidven:4) )No results found for this or any previous visit (from the past 240 hour(s)).   Radiological Exams on Admission: DG Chest 2 View  Result Date: 02/04/2021 CLINICAL DATA:  Atrial fibrillation, irregular heart rate today, history COPD EXAM: CHEST - 2 VIEW COMPARISON:  06/06/2018 FINDINGS: Normal heart size, mediastinal contours, and pulmonary vascularity. Atherosclerotic calcification aorta. Emphysematous and bronchitic changes consistent with COPD. Mild RIGHT basilar atelectasis and tiny bibasilar pleural effusions. No acute infiltrate or pneumothorax. Bones demineralized. IMPRESSION: COPD changes with tiny bibasilar pleural effusions and mild RIGHT basilar atelectasis. Aortic Atherosclerosis (ICD10-I70.0) and Emphysema (ICD10-J43.9). Electronically Signed   By: 08/06/2018 M.D.   On: 02/04/2021 17:12    EKG: Independently reviewed.  Atrial flutter with rapid ventricular rate.  Old anterior MI. Left bundle branch block.  No acute ST changes.  Assessment/Plan: Active  Problems:   GERD (gastroesophageal reflux disease)   Anxiety   Atrial fibrillation with RVR (HCC)    This patient was discussed with the ED physician, including pertinent vitals, physical exam findings, labs, and imaging.  We also discussed care given by the ED provider.  1. Atrial flutter with RVR a. Admit to stepdown b. Cardizem drip c. Continue Xarelto d. Telemetry monitoring 2. Anxiety a. Continue SSRI, b. Continue Will you as needed 3. GERD a. On PPI  DVT prophylaxis: Continue Xarelto discretion Consultants: None Code Status: DNR confirmed by patient will be back Family Communication: None Disposition Plan: Patient should be able to return home following improvement of heart rate   04/06/2021, DO

## 2021-02-05 DIAGNOSIS — I4891 Unspecified atrial fibrillation: Secondary | ICD-10-CM

## 2021-02-05 DIAGNOSIS — K219 Gastro-esophageal reflux disease without esophagitis: Secondary | ICD-10-CM | POA: Diagnosis not present

## 2021-02-05 DIAGNOSIS — K589 Irritable bowel syndrome without diarrhea: Secondary | ICD-10-CM

## 2021-02-05 DIAGNOSIS — J449 Chronic obstructive pulmonary disease, unspecified: Secondary | ICD-10-CM

## 2021-02-05 LAB — BASIC METABOLIC PANEL
Anion gap: 7 (ref 5–15)
BUN: 20 mg/dL (ref 8–23)
CO2: 23 mmol/L (ref 22–32)
Calcium: 8.5 mg/dL — ABNORMAL LOW (ref 8.9–10.3)
Chloride: 106 mmol/L (ref 98–111)
Creatinine, Ser: 0.85 mg/dL (ref 0.44–1.00)
GFR, Estimated: >60 mL/min (ref >60)
Glucose, Bld: 87 mg/dL (ref 70–99)
Potassium: 3.5 mmol/L (ref 3.5–5.1)
Sodium: 136 mmol/L (ref 135–145)

## 2021-02-05 MED ORDER — ACETAMINOPHEN 325 MG PO TABS: 650.0000 mg | ORAL_TABLET | ORAL | Status: DC | PRN

## 2021-02-05 MED ORDER — TROSPIUM CHLORIDE ER 60 MG PO CP24: 1.0000 | ORAL_CAPSULE | Freq: Every day | ORAL | Status: DC

## 2021-02-05 MED ORDER — LOPERAMIDE HCL 2 MG PO CAPS: 2.0000 mg | ORAL_CAPSULE | ORAL | Status: DC | PRN

## 2021-02-05 MED ORDER — PANTOPRAZOLE SODIUM 40 MG PO TBEC
40.0000 mg | DELAYED_RELEASE_TABLET | Freq: Every day | ORAL | Status: DC
  Administered 2021-02-05: 40 mg via ORAL
  Filled 2021-02-05: qty 1

## 2021-02-05 MED ORDER — ONDANSETRON HCL 4 MG/2ML IJ SOLN: 4.0000 mg | Freq: Four times a day (QID) | INTRAMUSCULAR | Status: DC | PRN

## 2021-02-05 MED ORDER — FLUTICASONE PROPIONATE 50 MCG/ACT NA SUSP
1.0000 | Freq: Every day | NASAL | Status: DC
  Administered 2021-02-05: 1 via NASAL
  Filled 2021-02-05: qty 16

## 2021-02-05 MED ORDER — UMECLIDINIUM BROMIDE 62.5 MCG/INH IN AEPB
1.0000 | INHALATION_SPRAY | Freq: Every day | RESPIRATORY_TRACT | Status: DC
  Administered 2021-02-05: 1 via RESPIRATORY_TRACT
  Filled 2021-02-05: qty 7

## 2021-02-05 MED ORDER — ALBUTEROL SULFATE (2.5 MG/3ML) 0.083% IN NEBU: 2.5000 mg | INHALATION_SOLUTION | Freq: Four times a day (QID) | RESPIRATORY_TRACT | Status: DC | PRN

## 2021-02-05 MED ORDER — DILTIAZEM HCL 30 MG PO TABS: 30.0000 mg | ORAL_TABLET | Freq: Two times a day (BID) | ORAL | Status: DC

## 2021-02-05 MED ORDER — DIAZEPAM 2 MG PO TABS
2.0000 mg | ORAL_TABLET | Freq: Four times a day (QID) | ORAL | Status: DC | PRN
  Administered 2021-02-05: 2 mg via ORAL
  Filled 2021-02-05: qty 1

## 2021-02-05 MED ORDER — CITALOPRAM HYDROBROMIDE 20 MG PO TABS
10.0000 mg | ORAL_TABLET | Freq: Every day | ORAL | Status: DC
  Administered 2021-02-05: 10 mg via ORAL
  Filled 2021-02-05: qty 1

## 2021-02-05 MED ORDER — MECLIZINE HCL 12.5 MG PO TABS
25.0000 mg | ORAL_TABLET | Freq: Every morning | ORAL | Status: DC
  Administered 2021-02-05: 25 mg via ORAL
  Filled 2021-02-05: qty 2

## 2021-02-05 MED ORDER — ONDANSETRON HCL 4 MG PO TABS: 4.0000 mg | ORAL_TABLET | Freq: Three times a day (TID) | ORAL | Status: DC | PRN

## 2021-02-05 MED ORDER — MIRABEGRON ER 25 MG PO TB24
50.0000 mg | ORAL_TABLET | Freq: Every day | ORAL | Status: DC
  Administered 2021-02-05: 50 mg via ORAL
  Filled 2021-02-05: qty 2

## 2021-02-05 MED ORDER — HYDROCODONE-ACETAMINOPHEN 5-325 MG PO TABS
1.0000 | ORAL_TABLET | Freq: Two times a day (BID) | ORAL | Status: DC
  Filled 2021-02-05: qty 1

## 2021-02-05 MED ORDER — NITROFURANTOIN MACROCRYSTAL 50 MG PO CAPS
50.0000 mg | ORAL_CAPSULE | Freq: Every day | ORAL | Status: DC
  Filled 2021-02-05 (×2): qty 1

## 2021-02-05 MED ORDER — DILTIAZEM HCL-DEXTROSE 125-5 MG/125ML-% IV SOLN (PREMIX): 5.0000 mg/h | INTRAVENOUS | Status: DC

## 2021-02-05 MED ORDER — ALBUTEROL SULFATE HFA 108 (90 BASE) MCG/ACT IN AERS: 1.0000 | INHALATION_SPRAY | Freq: Four times a day (QID) | RESPIRATORY_TRACT | Status: DC | PRN

## 2021-02-05 MED ORDER — DILTIAZEM HCL 30 MG PO TABS
30.0000 mg | ORAL_TABLET | Freq: Four times a day (QID) | ORAL | Status: DC
  Administered 2021-02-05 (×2): 30 mg via ORAL
  Filled 2021-02-05 (×2): qty 1

## 2021-02-05 MED ORDER — TRAZODONE HCL 50 MG PO TABS: 50.0000 mg | ORAL_TABLET | Freq: Every day | ORAL | Status: DC

## 2021-02-05 MED ORDER — CYCLOSPORINE 0.05 % OP EMUL
1.0000 [drp] | Freq: Two times a day (BID) | OPHTHALMIC | Status: DC
  Administered 2021-02-05: 1 [drp] via OPHTHALMIC
  Filled 2021-02-05: qty 1

## 2021-02-05 MED ORDER — RIVAROXABAN 15 MG PO TABS: 15.0000 mg | ORAL_TABLET | Freq: Every day | ORAL | Status: DC

## 2021-02-05 NOTE — Evaluation (Signed)
Physical Therapy Evaluation Patient Details Name: Diana Mata MRN: [id number] DOB: 20-Mar-1931 Today's Date: 02/05/2021   History of Present Illness  Diana Mata is a 87 y.o. female with a history of GERD, COPD, atrial flutter on chronic anticoagulation, IBS UTI.  Patient seen for weakness and racing heart rate for the past 2 days that has been increasing.  She was seen at her PCPs office who confirmed A. fib with RVR, patient was sent to the hospital.  She has a history of A. fib/a flutter and is on Xarelto.  No palliating provoking factors.  Clinical Impression  Patient functioning neart baseline for functional mobility and gait demonstrating good return for ambulation in room/hallways without loss of balance, on room air with cardio and RN checking heart and RN reported appropraite levels. Tolerated sitting up at bedside after therapy on room air - nursing staff notified.  Patient discharged to care of nursing for ambulation daily as tolerated for length of stay.    Follow Up Recommendations No PT follow up    Equipment Recommendations  None recommended by PT    Recommendations for Other Services       Precautions / Restrictions Precautions Precautions: None Restrictions Weight Bearing Restrictions: No      Mobility  Bed Mobility Overal bed mobility: Independent                  Transfers Overall transfer level: Independent Equipment used: Rolling walker (2 wheeled)             General transfer comment: independnet, no difficulties  Ambulation/Gait Ambulation/Gait assistance: Modified independent (Device/Increase time);Independent Gait Distance (Feet): 150 Feet Assistive device: Rolling walker (2 wheeled) Gait Pattern/deviations: Decreased stride length Gait velocity: typical   General Gait Details: 50 ft independent, slight sway. 100 ft with RW great speed, mobility, and safety.  Stairs            Wheelchair Mobility    Modified  Rankin (Stroke Patients Only)       Balance Overall balance assessment: Independent                                           Pertinent Vitals/Pain Pain Assessment: No/denies pain    Home Living Family/patient expects to be discharged to:: Private residence   Available Help at Discharge: Family Type of Home: Other(Comment) (townhouse) Home Access: Level entry     Home Layout: One level Home Equipment: Cane - single point      Prior Function Level of Independence: Independent         Comments: community ambulator independently     Higher education careers adviser        Extremity/Trunk Assessment   Upper Extremity Assessment Upper Extremity Assessment: Overall WFL for tasks assessed    Lower Extremity Assessment Lower Extremity Assessment: Overall WFL for tasks assessed    Cervical / Trunk Assessment Cervical / Trunk Assessment: Normal  Communication   Communication: No difficulties  Cognition Arousal/Alertness: Awake/alert Behavior During Therapy: WFL for tasks assessed/performed Overall Cognitive Status: Within Functional Limits for tasks assessed                                        General Comments      Exercises     Assessment/Plan PT eval and  discharged at this time.    PT Assessment Patent does not need any further PT services  PT Problem List         PT Treatment Interventions      PT Goals (Current goals can be found in the Care Plan section)  Acute Rehab PT Goals Patient Stated Goal: return home PT Goal Formulation: With patient Time For Goal Achievement: 02/12/21 Potential to Achieve Goals: Good    Frequency     Barriers to discharge        Co-evaluation               AM-PAC PT "6 Clicks" Mobility  Outcome Measure Help needed turning from your back to your side while in a flat bed without using bedrails?: None Help needed moving from lying on your back to sitting on the side of a flat bed  without using bedrails?: None Help needed moving to and from a bed to a chair (including a wheelchair)?: None Help needed standing up from a chair using your arms (e.g., wheelchair or bedside chair)?: None Help needed to walk in hospital room?: A Little Help needed climbing 3-5 steps with a railing? : A Little 6 Click Score: 22    End of Session   Activity Tolerance: Patient tolerated treatment well Patient left: in bed;with call bell/phone within reach;with nursing/sitter in room;with family/visitor present Nurse Communication: Mobility status PT Visit Diagnosis: Unsteadiness on feet (R26.81);Muscle weakness (generalized) (M62.81)    Time: 1140-1155 PT Time Calculation (min) (ACUTE ONLY): 15 min   Charges:   PT Evaluation $PT Eval Low Complexity: 1 Low           12:26 PM,02/05/21 Esmeralda Links, PT, DPT Physical Therapist at [facility]

## 2021-02-05 NOTE — Progress Notes (Signed)
Walked down entire hall at steady pace earlier and pulse rose to 100 and remained in SR.  IV's removed and DC instuctions reviewed with patient and daughter.  Established with Dr. Wyline Mood and will call him for appt.  Transported by WC to car. Daughter to drive home

## 2021-02-05 NOTE — Discharge Summary (Signed)
Physician Discharge Summary  Diana Mata XNA:[id number] DOB: Jun 24, 1931 DOA: 02/04/2021  PCP: Benita Stabile, MD  Admit date: 02/04/2021 Discharge date: 02/05/2021  Admitted From: Home Disposition: Home  Recommendations for Outpatient Follow-up:  1. Follow up with PCP in 1-2 weeks 2. Please obtain BMP/CBC in one week 3. Follow-up with cardiology, Dr. Wyline Mood as scheduled  Home Health: Equipment/Devices:  Discharge Condition: Stable CODE STATUS: DNR Diet recommendation: Heart healthy  Brief/Interim Summary: 87 year old female with a history of atrial flutter, COPD, GERD, presented to the emergency room with generalized weakness and palpitations for approximately 2 days duration.  She was seen at her primary care physician's office and it was confirmed that she was in atrial fibrillation rapid ventricular response.  She was sent to the emergency room for evaluation.  Upon arrival to the emergency room, she was noted to have heart rate in the 120.  She was initially started on Cardizem infusion.  Fortunately, she spontaneously converted back to sinus rhythm.  She maintained sinus rhythm for the remainder of her hospital course.  The patient did not have any recurrence of symptoms including palpitations, weakness or shortness of breath.  She was able to ambulate without difficulty.  Her care was reviewed with her daughter who assists in her care.  It was reported that patient was previously taking diltiazem 30 mg twice a day, but this was discontinued in approximately January.  She is currently on no rate limiting medications.  It was recommended that she continue on diltiazem 30 mg twice daily for now.  She can also take an extra as needed dose if she develops any palpitations.  She is already anticoagulated with Xarelto.  She has a follow-up appointment scheduled with cardiology.  Patient worked with physical therapy and was recommended.  She was felt stable to discharge home.  Discharge  Diagnoses:  Active Problems:   GERD (gastroesophageal reflux disease)   Anxiety   Atrial fibrillation with RVR (HCC)   COPD (chronic obstructive pulmonary disease) (HCC)   IBS (irritable bowel syndrome)    Discharge Instructions  Discharge Instructions    Diet - low sodium heart healthy   Complete by: As directed    Increase activity slowly   Complete by: As directed      Allergies as of 02/05/2021      Reactions   Levaquin [levofloxacin In D5w] Itching   Lower extremities   Penicillins Nausea Only   Has patient had a PCN reaction causing immediate rash, facial/tongue/throat swelling, SOB or lightheadedness with hypotension: No Has patient had a PCN reaction causing severe rash involving mucus membranes or skin necrosis: No Has patient had a PCN reaction that required hospitalization: No Has patient had a PCN reaction occurring within the last 10 years: No If all of the above answers are "NO", then may proceed with Cephalosporin use.   Sulfa Antibiotics Hives   Tequin [gatifloxacin] Itching   Lower extremities   Latex Itching, Rash      Medication List    STOP taking these medications   amoxicillin-clavulanate 875-125 MG tablet Commonly known as: AUGMENTIN     TAKE these medications   albuterol 108 (90 Base) MCG/ACT inhaler Commonly known as: VENTOLIN HFA Inhale 1-2 puffs into the lungs every 6 (six) hours as needed for wheezing or shortness of breath.   diazepam 5 MG tablet Commonly known as: VALIUM Take 5-10 mg by mouth every 6 (six) hours as needed for anxiety.   diltiazem 30 MG tablet Commonly known  as: CARDIZEM Take 1 tablet by mouth twice daily   escitalopram 5 MG tablet Commonly known as: LEXAPRO Take 5 mg by mouth daily.   fluticasone 50 MCG/ACT nasal spray Commonly known as: FLONASE Place 1 spray into both nostrils daily.   FUSION PLUS PO Take 1 capsule by mouth daily.   HYDROcodone-acetaminophen 5-325 MG tablet Commonly known as:  NORCO/VICODIN Take 1 tablet by mouth 2 (two) times daily.   loperamide 2 MG capsule Commonly known as: IMODIUM Take 2 mg by mouth as needed for diarrhea or loose stools.   meclizine 25 MG tablet Commonly known as: ANTIVERT Take 25 mg by mouth every morning.   Myrbetriq 50 MG Tb24 tablet Generic drug: mirabegron ER Take 1 tablet (50 mg total) by mouth daily.   nitrofurantoin 50 MG capsule Commonly known as: MACRODANTIN TAKE ONE CAPSULE BY MOUTH EVERYDAY AT BEDTIME   omeprazole 20 MG capsule Commonly known as: PRILOSEC Take 20 mg by mouth daily.   ondansetron 4 MG tablet Commonly known as: ZOFRAN Take 4 mg by mouth every 6 (six) hours as needed. for nausea   PRESERVISION AREDS PO Take 1 tablet by mouth daily.   Restasis 0.05 % ophthalmic emulsion Generic drug: cycloSPORINE Place 1 drop into both eyes 2 (two) times daily.   Rivaroxaban 15 MG Tabs tablet Commonly known as: Xarelto Take 1 tablet (15 mg total) by mouth every morning. What changed: when to take this   traZODone 50 MG tablet Commonly known as: DESYREL Take 50 mg by mouth at bedtime.   Trospium Chloride 60 MG Cp24 Take 1 capsule (60 mg total) by mouth at bedtime.       Follow-up Information    Branch, Dorothe PeaJonathan F, MD Follow up.   Specialty: Cardiology Why: follow up as previously scheduled Contact information: [address] [place] [address] [phone]              Allergies  Allergen Reactions  . Levaquin [Levofloxacin In D5w] Itching    Lower extremities  . Penicillins Nausea Only    Has patient had a PCN reaction causing immediate rash, facial/tongue/throat swelling, SOB or lightheadedness with hypotension: No Has patient had a PCN reaction causing severe rash involving mucus membranes or skin necrosis: No Has patient had a PCN reaction that required hospitalization: No Has patient had a PCN reaction occurring within the last 10 years: No If all of the above answers are "NO",  then may proceed with Cephalosporin use.   . Sulfa Antibiotics Hives  . Tequin [Gatifloxacin] Itching    Lower extremities  . Latex Itching and Rash    Consultations:     Procedures/Studies: DG Chest 2 View  Result Date: 02/04/2021 CLINICAL DATA:  Atrial fibrillation, irregular heart rate today, history COPD EXAM: CHEST - 2 VIEW COMPARISON:  06/06/2018 FINDINGS: Normal heart size, mediastinal contours, and pulmonary vascularity. Atherosclerotic calcification aorta. Emphysematous and bronchitic changes consistent with COPD. Mild RIGHT basilar atelectasis and tiny bibasilar pleural effusions. No acute infiltrate or pneumothorax. Bones demineralized. IMPRESSION: COPD changes with tiny bibasilar pleural effusions and mild RIGHT basilar atelectasis. Aortic Atherosclerosis (ICD10-I70.0) and Emphysema (ICD10-J43.9). Electronically Signed   By: Ulyses SouthwardMark  Boles M.D.   On: 02/04/2021 17:12       Subjective: No chest pain, shortness of breath or palpitations  Discharge Exam: Vitals:   02/05/21 0230 02/05/21 0300 02/05/21 0400 02/05/21 0736  BP: (!) 122/51 (!) 119/54 (!) 132/54   Pulse: 77 78 72   Resp: 16  19 18   Temp:   98.1 F (36.7 C)   TempSrc:   Oral   SpO2: 95% 94% 98% 96%  Weight:      Height:        General: Pt is alert, awake, not in acute distress Cardiovascular: RRR, S1/S2 +, no rubs, no gallops Respiratory: CTA bilaterally, no wheezing, no rhonchi Abdominal: Soft, NT, ND, bowel sounds + Extremities: no edema, no cyanosis    The results of significant diagnostics from this hospitalization (including imaging, microbiology, ancillary and laboratory) are listed below for reference.     Microbiology: Recent Results (from the past 240 hour(s))  Resp Panel by RT-PCR (Flu A&B, Covid) Nasopharyngeal Swab     Status: None   Collection Time: 02/04/21  9:33 PM   Specimen: Nasopharyngeal Swab; Nasopharyngeal(NP) swabs in vial transport medium  Result Value Ref Range Status    SARS Coronavirus 2 by RT PCR NEGATIVE NEGATIVE Final    Comment: (NOTE) SARS-CoV-2 target nucleic acids are NOT DETECTED.  The SARS-CoV-2 RNA is generally detectable in upper respiratory specimens during the acute phase of infection. The lowest concentration of SARS-CoV-2 viral copies this assay can detect is 138 copies/mL. A negative result does not preclude SARS-Cov-2 infection and should not be used as the sole basis for treatment or other patient management decisions. A negative result may occur with  improper specimen collection/handling, submission of specimen other than nasopharyngeal swab, presence of viral mutation(s) within the areas targeted by this assay, and inadequate number of viral copies(<138 copies/mL). A negative result must be combined with clinical observations, patient history, and epidemiological information. The expected result is Negative.  Fact Sheet for Patients:  BloggerCourse.com  Fact Sheet for Healthcare Providers:  SeriousBroker.it  This test is no t yet approved or cleared by the [place] FDA and  has been authorized for detection and/or diagnosis of SARS-CoV-2 by FDA under an Emergency Use Authorization (EUA). This EUA will remain  in effect (meaning this test can be used) for the duration of the COVID-19 declaration under Section 564(b)(1) of the Act, 21 U.S.C.section 360bbb-3(b)(1), unless the authorization is terminated  or revoked sooner.       Influenza A by PCR NEGATIVE NEGATIVE Final   Influenza B by PCR NEGATIVE NEGATIVE Final    Comment: (NOTE) The Xpert Xpress SARS-CoV-2/FLU/RSV plus assay is intended as an aid in the diagnosis of influenza from Nasopharyngeal swab specimens and should not be used as a sole basis for treatment. Nasal washings and aspirates are unacceptable for Xpert Xpress SARS-CoV-2/FLU/RSV testing.  Fact Sheet for  Patients: BloggerCourse.com  Fact Sheet for Healthcare Providers: SeriousBroker.it  This test is not yet approved or cleared by the [place] FDA and has been authorized for detection and/or diagnosis of SARS-CoV-2 by FDA under an Emergency Use Authorization (EUA). This EUA will remain in effect (meaning this test can be used) for the duration of the COVID-19 declaration under Section 564(b)(1) of the Act, 21 U.S.C. section 360bbb-3(b)(1), unless the authorization is terminated or revoked.  Performed at [facility], [address], [place], [place] [address]      Labs: BNP (last 3 results) No results for input(s): BNP in the last 8760 hours. Basic Metabolic Panel: Recent Labs  Lab 02/04/21 1624 02/04/21 1631 02/05/21 0837  NA  --  134* 136  K  --  4.1 3.5  CL  --  105 106  CO2  --  23 23  GLUCOSE  --  105*  87  BUN  --  24* 20  CREATININE  --  1.02* 0.85  CALCIUM  --  8.6* 8.5*  MG 2.1  --   --    Liver Function Tests: No results for input(s): AST, ALT, ALKPHOS, BILITOT, PROT, ALBUMIN in the last 168 hours. No results for input(s): LIPASE, AMYLASE in the last 168 hours. No results for input(s): AMMONIA in the last 168 hours. CBC: Recent Labs  Lab 02/04/21 1631  WBC 8.8  HGB 11.6*  HCT 37.3  MCV 86.7  PLT 309   Cardiac Enzymes: No results for input(s): CKTOTAL, CKMB, CKMBINDEX, TROPONINI in the last 168 hours. BNP: Invalid input(s): POCBNP CBG: No results for input(s): GLUCAP in the last 168 hours. D-Dimer No results for input(s): DDIMER in the last 72 hours. Hgb A1c No results for input(s): HGBA1C in the last 72 hours. Lipid Profile No results for input(s): CHOL, HDL, LDLCALC, TRIG, CHOLHDL, LDLDIRECT in the last 72 hours. Thyroid function studies No results for input(s): TSH, T4TOTAL, T3FREE, THYROIDAB in the last 72 hours.  Invalid input(s): FREET3 Anemia work up No results for input(s):  VITAMINB12, FOLATE, FERRITIN, TIBC, IRON, RETICCTPCT in the last 72 hours. Urinalysis    Component Value Date/Time   COLORURINE YELLOW 11/27/2018 1535   APPEARANCEUR Hazy (A) 11/16/2020 1342   LABSPEC 1.017 11/27/2018 1535   PHURINE 5.0 11/27/2018 1535   GLUCOSEU Negative 11/16/2020 1342   HGBUR NEGATIVE 11/27/2018 1535   BILIRUBINUR Negative 11/16/2020 1342   KETONESUR NEGATIVE 11/27/2018 1535   PROTEINUR Negative 11/16/2020 1342   PROTEINUR 100 (A) 11/27/2018 1535   UROBILINOGEN negative (A) 09/24/2019 1629   NITRITE Positive (A) 11/16/2020 1342   NITRITE NEGATIVE 11/27/2018 1535   LEUKOCYTESUR 3+ (A) 11/16/2020 1342   LEUKOCYTESUR MODERATE (A) 11/27/2018 1535   Sepsis Labs Invalid input(s): PROCALCITONIN,  WBC,  LACTICIDVEN Microbiology Recent Results (from the past 240 hour(s))  Resp Panel by RT-PCR (Flu A&B, Covid) Nasopharyngeal Swab     Status: None   Collection Time: 02/04/21  9:33 PM   Specimen: Nasopharyngeal Swab; Nasopharyngeal(NP) swabs in vial transport medium  Result Value Ref Range Status   SARS Coronavirus 2 by RT PCR NEGATIVE NEGATIVE Final    Comment: (NOTE) SARS-CoV-2 target nucleic acids are NOT DETECTED.  The SARS-CoV-2 RNA is generally detectable in upper respiratory specimens during the acute phase of infection. The lowest concentration of SARS-CoV-2 viral copies this assay can detect is 138 copies/mL. A negative result does not preclude SARS-Cov-2 infection and should not be used as the sole basis for treatment or other patient management decisions. A negative result may occur with  improper specimen collection/handling, submission of specimen other than nasopharyngeal swab, presence of viral mutation(s) within the areas targeted by this assay, and inadequate number of viral copies(<138 copies/mL). A negative result must be combined with clinical observations, patient history, and epidemiological information. The expected result is Negative.  Fact  Sheet for Patients:  BloggerCourse.com  Fact Sheet for Healthcare Providers:  SeriousBroker.it  This test is no t yet approved or cleared by the [place] FDA and  has been authorized for detection and/or diagnosis of SARS-CoV-2 by FDA under an Emergency Use Authorization (EUA). This EUA will remain  in effect (meaning this test can be used) for the duration of the COVID-19 declaration under Section 564(b)(1) of the Act, 21 U.S.C.section 360bbb-3(b)(1), unless the authorization is terminated  or revoked sooner.       Influenza A by PCR NEGATIVE NEGATIVE  Final   Influenza B by PCR NEGATIVE NEGATIVE Final    Comment: (NOTE) The Xpert Xpress SARS-CoV-2/FLU/RSV plus assay is intended as an aid in the diagnosis of influenza from Nasopharyngeal swab specimens and should not be used as a sole basis for treatment. Nasal washings and aspirates are unacceptable for Xpert Xpress SARS-CoV-2/FLU/RSV testing.  Fact Sheet for Patients: BloggerCourse.com  Fact Sheet for Healthcare Providers: SeriousBroker.it  This test is not yet approved or cleared by the [place] FDA and has been authorized for detection and/or diagnosis of SARS-CoV-2 by FDA under an Emergency Use Authorization (EUA). This EUA will remain in effect (meaning this test can be used) for the duration of the COVID-19 declaration under Section 564(b)(1) of the Act, 21 U.S.C. section 360bbb-3(b)(1), unless the authorization is terminated or revoked.  Performed at [facility], [address] [place], [place] [address]      Time coordinating discharge:  SIGNED:   Erick Blinks, MD  [facility] 02/05/2021, 2:08 PM   If 7PM-7AM, please contact night-coverage [email or web address]

## 2021-02-05 NOTE — Discharge Instructions (Signed)

## 2021-02-07 ENCOUNTER — Ambulatory Visit: Payer: Medicare Other

## 2021-02-07 ENCOUNTER — Other Ambulatory Visit: Payer: Self-pay

## 2021-02-07 ENCOUNTER — Ambulatory Visit (INDEPENDENT_AMBULATORY_CARE_PROVIDER_SITE_OTHER): Payer: Medicare Other

## 2021-02-07 DIAGNOSIS — N3281 Overactive bladder: Secondary | ICD-10-CM | POA: Diagnosis not present

## 2021-02-07 NOTE — Progress Notes (Signed)
PTNS  Session # 12  Health & Social Factors: none Caffeine: 1-2 cup Alcohol: none Daytime voids #per day: 4 Night-time voids #per night: 1-2 Urgency: yes Incontinence Episodes #per day: all day Ankle used: left Treatment Setting: 2 Feeling/ Response: strong sensation toe reflex at 3 pt could no tolerate Comments:   Performed By: Marchelle Folks RN  Assistant: none  Follow Up: MD F/u 6/22

## 2021-02-23 ENCOUNTER — Other Ambulatory Visit: Payer: Self-pay

## 2021-02-23 ENCOUNTER — Ambulatory Visit (INDEPENDENT_AMBULATORY_CARE_PROVIDER_SITE_OTHER): Payer: Medicare Other | Admitting: Urology

## 2021-02-23 ENCOUNTER — Encounter: Payer: Self-pay | Admitting: Urology

## 2021-02-23 VITALS — BP 115/69 | HR 87

## 2021-02-23 DIAGNOSIS — N3021 Other chronic cystitis with hematuria: Secondary | ICD-10-CM | POA: Diagnosis not present

## 2021-02-23 DIAGNOSIS — N3281 Overactive bladder: Secondary | ICD-10-CM | POA: Diagnosis not present

## 2021-02-23 MED ORDER — NITROFURANTOIN MACROCRYSTAL 50 MG PO CAPS
ORAL_CAPSULE | ORAL | 3 refills | Status: DC
Start: 2021-02-23 — End: 2021-04-27

## 2021-02-23 MED ORDER — GEMTESA 75 MG PO TABS: 1.0000 | ORAL_TABLET | Freq: Every day | ORAL | 0 refills | Status: DC

## 2021-02-23 NOTE — Progress Notes (Signed)
02/23/2021 3:44 PM   Diana Mata 02-11-1931 [id number]  Referring provider: Benita Stabile, MD [address] [place],  [place] [address]  Followup OAB   HPI: [name] is a 87yo here for followup for OAB. She underwent PTNS which mildly improved her incontinence and urinary urgency. She continues to have nocturnal enuresis. She is very unhappy with her OAb symptoms and her incontinence. She has not tried [place].     PMH: Past Medical History:  Diagnosis Date   Atrial flutter (HCC)    Chronic UTI    COPD (chronic obstructive pulmonary disease) (HCC)    Dizziness    GERD (gastroesophageal reflux disease)    IBS (irritable bowel syndrome)    Renal disorder    renal insufficiency    Surgical History: 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     JOINT REPLACEMENT      Home Medications:  Allergies as of 02/23/2021       Reactions   Levaquin [levofloxacin In D5w] Itching   Lower extremities   Penicillins Nausea Only   Has patient had a PCN reaction causing immediate rash, facial/tongue/throat swelling, SOB or lightheadedness with hypotension: No Has patient had a PCN reaction causing severe rash involving mucus membranes or skin necrosis: No Has patient had a PCN reaction that required hospitalization: No Has patient had a PCN reaction occurring within the last 10 years: No If all of the above answers are "NO", then may proceed with Cephalosporin use.   Sulfa Antibiotics Hives   Tequin [gatifloxacin] Itching   Lower extremities   Latex Itching, Rash        Medication List        Accurate as of February 23, 2021  3:44 PM. If you have any questions, ask your nurse or doctor.          albuterol 108 (90 Base) MCG/ACT inhaler Commonly known as: VENTOLIN HFA Inhale 1-2 puffs into the lungs every 6 (six) hours as needed for wheezing or shortness of breath.   diazepam 5 MG  tablet Commonly known as: VALIUM Take 5-10 mg by mouth every 6 (six) hours as needed for anxiety.   diltiazem 30 MG tablet Commonly known as: CARDIZEM Take 1 tablet by mouth twice daily   escitalopram 5 MG tablet Commonly known as: LEXAPRO Take 5 mg by mouth daily.   fluticasone 50 MCG/ACT nasal spray Commonly known as: FLONASE Place 1 spray into both nostrils daily.   FUSION PLUS PO Take 1 capsule by mouth daily.   HYDROcodone-acetaminophen 5-325 MG tablet Commonly known as: NORCO/VICODIN Take 1 tablet by mouth 2 (two) times daily.   loperamide 2 MG capsule Commonly known as: IMODIUM Take 2 mg by mouth as needed for diarrhea or loose stools.   meclizine 25 MG tablet Commonly known as: ANTIVERT Take 25 mg by mouth every morning.   Myrbetriq 50 MG Tb24 tablet Generic drug: mirabegron ER Take 1 tablet (50 mg total) by mouth daily.   nitrofurantoin 50 MG capsule Commonly known as: MACRODANTIN TAKE ONE CAPSULE BY MOUTH EVERYDAY AT BEDTIME   omeprazole 20 MG capsule Commonly known as: PRILOSEC Take 20 mg by mouth daily.   ondansetron 4 MG tablet Commonly known as: ZOFRAN Take 4 mg by mouth every 6 (six) hours as needed. for nausea   PRESERVISION AREDS PO Take 1 tablet by mouth daily.   Restasis 0.05 % ophthalmic  emulsion Generic drug: cycloSPORINE Place 1 drop into both eyes 2 (two) times daily.   Rivaroxaban 15 MG Tabs tablet Commonly known as: Xarelto Take 1 tablet (15 mg total) by mouth every morning. What changed: when to take this   traZODone 50 MG tablet Commonly known as: DESYREL Take 50 mg by mouth at bedtime.   Trospium Chloride 60 MG Cp24 Take 1 capsule (60 mg total) by mouth at bedtime.        Allergies:  Allergies  Allergen Reactions   Levaquin [Levofloxacin In D5w] Itching    Lower extremities   Penicillins Nausea Only    Has patient had a PCN reaction causing immediate rash, facial/tongue/throat swelling, SOB or lightheadedness with  hypotension: No Has patient had a PCN reaction causing severe rash involving mucus membranes or skin necrosis: No Has patient had a PCN reaction that required hospitalization: No Has patient had a PCN reaction occurring within the last 10 years: No If all of the above answers are "NO", then may proceed with Cephalosporin use.    Sulfa Antibiotics Hives   Tequin [Gatifloxacin] Itching    Lower extremities   Latex Itching and Rash    Family History: Family History  Problem Relation Age of Onset   Hypertension Mother     Social History:  reports that she has never smoked. She has never used smokeless tobacco. She reports that she does not drink alcohol and does not use drugs.  ROS: All other review of systems were reviewed and are negative except what is noted above in HPI  Physical Exam: BP 115/69   Pulse 87   Constitutional:  Alert and oriented, No acute distress. HEENT: [place] AT, moist mucus membranes.  Trachea midline, no masses. Cardiovascular: No clubbing, cyanosis, or edema. Respiratory: Normal respiratory effort, no increased work of breathing. GI: Abdomen is soft, nontender, nondistended, no abdominal masses GU: No CVA tenderness.  Lymph: No cervical or inguinal lymphadenopathy. Skin: No rashes, bruises or suspicious lesions. Neurologic: Grossly intact, no focal deficits, moving all 4 extremities. Psychiatric: Normal mood and affect.  Laboratory Data: Lab Results  Component Value Date   WBC 8.8 02/04/2021   HGB 11.6 (L) 02/04/2021   HCT 37.3 02/04/2021   MCV 86.7 02/04/2021   PLT 309 02/04/2021    Lab Results  Component Value Date   CREATININE 0.85 02/05/2021    No results found for: PSA  No results found for: TESTOSTERONE  No results found for: HGBA1C  Urinalysis    Component Value Date/Time   COLORURINE YELLOW 11/27/2018 1535   APPEARANCEUR Hazy (A) 11/16/2020 1342   LABSPEC 1.017 11/27/2018 1535   PHURINE 5.0 11/27/2018 1535   GLUCOSEU Negative  11/16/2020 1342   HGBUR NEGATIVE 11/27/2018 1535   BILIRUBINUR Negative 11/16/2020 1342   KETONESUR NEGATIVE 11/27/2018 1535   PROTEINUR Negative 11/16/2020 1342   PROTEINUR 100 (A) 11/27/2018 1535   UROBILINOGEN negative (A) 09/24/2019 1629   NITRITE Positive (A) 11/16/2020 1342   NITRITE NEGATIVE 11/27/2018 1535   LEUKOCYTESUR 3+ (A) 11/16/2020 1342   LEUKOCYTESUR MODERATE (A) 11/27/2018 1535    Lab Results  Component Value Date   LABMICR See below: 11/16/2020   WBCUA >30 (A) 11/16/2020   LABEPIT >10 (A) 11/16/2020   BACTERIA Many (A) 11/16/2020    Pertinent Imaging:  No results found for this or any previous visit.  No results found for this or any previous visit.  No results found for this or any previous visit.  No  results found for this or any previous visit.  No results found for this or any previous visit.  No results found for this or any previous visit.  No results found for this or any previous visit.  No results found for this or any previous visit.   Assessment & Plan:    1. OAB (overactive bladder) -We will trial Gemtesa 75mg  daily -If she fails gemtesa we discussed intravesical botox versus indwelling foley.  - Urinalysis, Routine w reflex microscopic   No follow-ups on file.  , MD  [facility]

## 2021-02-23 NOTE — Patient Instructions (Signed)

## 2021-02-23 NOTE — Progress Notes (Signed)
Urological Symptom Review  Patient is experiencing the following symptoms: Hard to postpone urination Leakage of urine   Review of Systems  Gastrointestinal (upper)  : Negative for upper GI symptoms  Gastrointestinal (lower) : Negative for lower GI symptoms  Constitutional : Negative for symptoms  Skin: Negative for skin symptoms  Eyes: Negative for eye symptoms  Ear/Nose/Throat : Negative for Ear/Nose/Throat symptoms  Hematologic/Lymphatic: Negative for Hematologic/Lymphatic symptoms  Cardiovascular : Negative for cardiovascular symptoms  Respiratory : Negative for respiratory symptoms  Endocrine: Negative for endocrine symptoms  Musculoskeletal: Negative for musculoskeletal symptoms  Neurological: Negative for neurological symptoms  Psychologic: Negative for psychiatric symptoms  

## 2021-03-15 ENCOUNTER — Ambulatory Visit (INDEPENDENT_AMBULATORY_CARE_PROVIDER_SITE_OTHER): Payer: Medicare Other | Admitting: Cardiology

## 2021-03-15 ENCOUNTER — Other Ambulatory Visit: Payer: Self-pay

## 2021-03-15 ENCOUNTER — Encounter: Payer: Self-pay | Admitting: Cardiology

## 2021-03-15 ENCOUNTER — Encounter (INDEPENDENT_AMBULATORY_CARE_PROVIDER_SITE_OTHER): Payer: Self-pay

## 2021-03-15 VITALS — BP 130/62 | HR 90 | Ht 64.0 in | Wt 123.0 lb

## 2021-03-15 DIAGNOSIS — N3021 Other chronic cystitis with hematuria: Secondary | ICD-10-CM

## 2021-03-15 DIAGNOSIS — I480 Paroxysmal atrial fibrillation: Secondary | ICD-10-CM

## 2021-03-15 DIAGNOSIS — N3281 Overactive bladder: Secondary | ICD-10-CM

## 2021-03-15 MED ORDER — TROSPIUM CHLORIDE ER 60 MG PO CP24: 1.0000 | ORAL_CAPSULE | Freq: Every day | ORAL | 1 refills | Status: DC

## 2021-03-15 NOTE — Patient Instructions (Signed)

## 2021-03-15 NOTE — Progress Notes (Signed)
Clinical Summary Diana Mata is a 87 y.o.female former patient of Dr Purvis Sheffield, this is our first visit together.  PAF - notes mentino prior history of paroxsmal afib/aflutter - recent admission 02/2021 - from notes was controlled on dilt gtt, self converted - it was noted she was no longer taking her home diltiazem 30mg  bid.  - no recent symptoms.  - no bleeding on xarelto.  - home bp's   2. Chronic LBBB   3. COPD   4. CKD 3   Past Medical History:  Diagnosis Date   Atrial flutter (HCC)    Chronic UTI    COPD (chronic obstructive pulmonary disease) (HCC)    Dizziness    GERD (gastroesophageal reflux disease)    IBS (irritable bowel syndrome)    Renal disorder    renal insufficiency     Allergies  Allergen Reactions   Levaquin [Levofloxacin In D5w] Itching    Lower extremities   Penicillins Nausea Only    Has patient had a PCN reaction causing immediate rash, facial/tongue/throat swelling, SOB or lightheadedness with hypotension: No Has patient had a PCN reaction causing severe rash involving mucus membranes or skin necrosis: No Has patient had a PCN reaction that required hospitalization: No Has patient had a PCN reaction occurring within the last 10 years: No If all of the above answers are "NO", then may proceed with Cephalosporin use.    Sulfa Antibiotics Hives   Tequin [Gatifloxacin] Itching    Lower extremities   Latex Itching and Rash     Current Outpatient Medications  Medication Sig Dispense Refill   albuterol (PROVENTIL HFA;VENTOLIN HFA) 108 (90 Base) MCG/ACT inhaler Inhale 1-2 puffs into the lungs every 6 (six) hours as needed for wheezing or shortness of breath.     diazepam (VALIUM) 5 MG tablet Take 5-10 mg by mouth every 6 (six) hours as needed for anxiety.     diltiazem (CARDIZEM) 30 MG tablet Take 1 tablet by mouth twice daily 180 tablet 0   escitalopram (LEXAPRO) 5 MG tablet Take 5 mg by mouth daily.     fluticasone (FLONASE) 50  MCG/ACT nasal spray Place 1 spray into both nostrils daily.     HYDROcodone-acetaminophen (NORCO/VICODIN) 5-325 MG tablet Take 1 tablet by mouth 2 (two) times daily.     Iron-FA-B Cmp-C-Biot-Probiotic (FUSION PLUS PO) Take 1 capsule by mouth daily.     loperamide (IMODIUM) 2 MG capsule Take 2 mg by mouth as needed for diarrhea or loose stools.     meclizine (ANTIVERT) 25 MG tablet Take 25 mg by mouth every morning.     Multiple Vitamins-Minerals (PRESERVISION AREDS PO) Take 1 tablet by mouth daily.     MYRBETRIQ 50 MG TB24 tablet Take 1 tablet (50 mg total) by mouth daily. 30 tablet 11   nitrofurantoin (MACRODANTIN) 50 MG capsule TAKE ONE CAPSULE BY MOUTH EVERYDAY AT BEDTIME 90 capsule 3   omeprazole (PRILOSEC) 20 MG capsule Take 20 mg by mouth daily.     ondansetron (ZOFRAN) 4 MG tablet Take 4 mg by mouth every 6 (six) hours as needed. for nausea     RESTASIS 0.05 % ophthalmic emulsion Place 1 drop into both eyes 2 (two) times daily.     Rivaroxaban (XARELTO) 15 MG TABS tablet Take 1 tablet (15 mg total) by mouth every morning. (Patient taking differently: Take 15 mg by mouth every evening.) 30 tablet 11   traZODone (DESYREL) 50 MG tablet Take 50 mg by mouth  at bedtime.     Trospium Chloride 60 MG CP24 Take 1 capsule (60 mg total) by mouth at bedtime. 30 capsule 1   Vibegron (GEMTESA) 75 MG TABS Take 1 capsule by mouth daily. 30 tablet 0   No current facility-administered medications for this visit.     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    Sulfa Antibiotics Hives   Tequin [Gatifloxacin] Itching    Lower extremities   Latex Itching and Rash      Family History  Problem Relation Age of Onset   Hypertension Mother      Social History Diana Mata reports that she has never smoked. She has never used smokeless tobacco. Ms. Snader reports no history of alcohol use.   Review of Systems CONSTITUTIONAL: No weight loss, fever, chills, weakness or fatigue.  HEENT: Eyes: No visual loss, blurred vision, double vision or yellow sclerae.No hearing loss, sneezing, congestion, runny nose or sore throat.  SKIN: No rash or itching.  CARDIOVASCULAR: per hpi RESPIRATORY: No shortness of breath, cough or sputum.  GASTROINTESTINAL: No anorexia, nausea, vomiting or diarrhea. No abdominal pain or blood.  GENITOURINARY: No burning on urination, no polyuria NEUROLOGICAL: No headache, dizziness, syncope, paralysis, ataxia, numbness or tingling in the extremities. No change in bowel or bladder control.  MUSCULOSKELETAL: No muscle, back pain, joint pain or stiffness.  LYMPHATICS: No enlarged nodes. No history of splenectomy.  PSYCHIATRIC: No history of depression or anxiety.  ENDOCRINOLOGIC: No reports of sweating, cold or heat intolerance. No polyuria or polydipsia.  Marland Kitchen   Physical Examination Today's Vitals   03/15/21 0925  BP: 130/62  Pulse: 90  SpO2: 96%  Weight: 123 lb (55.8 kg)  Height: 5\' 4"  (1.626 m)   Body mass index is 21.11 kg/m.  Gen: resting comfortably, no acute distress HEENT: no scleral icterus, pupils equal round and reactive, no palptable cervical adenopathy,  CV: RRR, no m/r/g, no jvd Resp: Clear to auscultation bilaterally GI: abdomen is soft, non-tender, non-distended, normal bowel  sounds, no hepatosplenomegaly MSK: extremities are warm, no edema.  Skin: warm, no rash Neuro:  no focal deficits Psych: appropriate affect     Assessment and Plan  PAF - doing well since discharge, no recurrent symptoms - continue diltiazem and eliquis.  - appears had some low bp's on dilt before and that's when it was stopped, currentlty tolerating.       , M.D.

## 2021-04-27 ENCOUNTER — Other Ambulatory Visit: Payer: Self-pay

## 2021-04-27 ENCOUNTER — Ambulatory Visit (INDEPENDENT_AMBULATORY_CARE_PROVIDER_SITE_OTHER): Payer: Medicare Other | Admitting: Urology

## 2021-04-27 ENCOUNTER — Encounter: Payer: Self-pay | Admitting: Urology

## 2021-04-27 VITALS — BP 90/55 | HR 87

## 2021-04-27 DIAGNOSIS — N3281 Overactive bladder: Secondary | ICD-10-CM

## 2021-04-27 DIAGNOSIS — N3021 Other chronic cystitis with hematuria: Secondary | ICD-10-CM | POA: Diagnosis not present

## 2021-04-27 MED ORDER — GEMTESA 75 MG PO TABS: 1.0000 | ORAL_TABLET | Freq: Every day | ORAL | 11 refills | Status: DC

## 2021-04-27 MED ORDER — NITROFURANTOIN MACROCRYSTAL 50 MG PO CAPS: ORAL_CAPSULE | ORAL | 3 refills | Status: DC

## 2021-04-27 NOTE — Progress Notes (Signed)
Urological Symptom Review  Patient is experiencing the following symptoms: Frequent urination   Review of Systems  Gastrointestinal (upper)  : Negative for upper GI symptoms  Gastrointestinal (lower) : Negative for lower GI symptoms  Constitutional : Negative for symptoms  Skin: Negative for skin symptoms  Eyes: Negative for eye symptoms  Ear/Nose/Throat : Negative for Ear/Nose/Throat symptoms  Hematologic/Lymphatic: Negative for Hematologic/Lymphatic symptoms  Cardiovascular : Negative for cardiovascular symptoms  Respiratory : Negative for respiratory symptoms  Endocrine: Excessive thirst  Musculoskeletal: Negative for musculoskeletal symptoms  Neurological: Negative for neurological symptoms  Psychologic: Negative for psychiatric symptoms  

## 2021-04-27 NOTE — Progress Notes (Signed)
04/27/2021 1:38 PM   Diana Mata Feb 15, 1931 [id number]  Referring provider: Benita Stabile, MD [address] [place],  [place] [address]  Followup OAB   HPI: [name] is a 87yo here for followup for recurrent UTI and OAB. No UTIs since last visit. She is on macrobid 50mg  qhs. She started gemtesa last visit for urinary urgency, urge incontinence and nocturnal enuresis. Nocturnal enuresis resolved. She has occasional urinary urgency and very rare urge incontinence. 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     JOINT REPLACEMENT      Home Medications:  Allergies as of 04/27/2021       Reactions   Levaquin [levofloxacin In D5w] Itching   Lower extremities   Levofloxacin    Penicillins Nausea Only   Has patient had a PCN reaction causing immediate rash, facial/tongue/throat swelling, SOB or lightheadedness with hypotension: No Has patient had a PCN reaction causing severe rash involving mucus membranes or skin necrosis: No Has patient had a PCN reaction that required hospitalization: No Has patient had a PCN reaction occurring within the last 10 years: No If all of the above answers are "NO", then may proceed with Cephalosporin use.   Sulfa Antibiotics Hives   Tequin [gatifloxacin] Itching   Lower extremities   Latex Itching, Rash        Medication List        Accurate as of April 27, 2021  1:38 PM. If you have any questions, ask your nurse or doctor.          albuterol 108 (90 Base) MCG/ACT inhaler Commonly known as: VENTOLIN HFA Inhale 1-2 puffs into the lungs every 6  (six) hours as needed for wheezing or shortness of breath.   diazepam 5 MG tablet Commonly known as: VALIUM Take 5-10 mg by mouth every 6 (six) hours as needed for anxiety.   diltiazem 30 MG tablet Commonly known as: CARDIZEM Take 1 tablet by mouth twice daily   escitalopram 5 MG tablet Commonly known as: LEXAPRO Take 5 mg by mouth daily.   fluticasone 50 MCG/ACT nasal spray Commonly known as: FLONASE Place 1 spray into both nostrils daily.   FUSION PLUS PO Take 1 capsule by mouth daily.   Gemtesa 75 MG Tabs Generic drug: Vibegron Take 1 capsule by mouth daily.   HYDROcodone-acetaminophen 5-325 MG tablet Commonly known as: NORCO/VICODIN Take 1 tablet by mouth every 4 (four) hours as needed.   loperamide 2 MG capsule Commonly known as: IMODIUM Take 2 mg by mouth as needed for diarrhea or loose stools.   meclizine 25 MG tablet Commonly known as: ANTIVERT Take 25 mg by mouth every morning.   nitrofurantoin 50 MG capsule Commonly known as: MACRODANTIN TAKE ONE CAPSULE BY MOUTH EVERYDAY AT BEDTIME   omeprazole 20 MG capsule Commonly known as: PRILOSEC Take 20 mg by mouth daily.   ondansetron 4 MG tablet Commonly known as: ZOFRAN Take 4 mg by mouth every 6 (six) hours as needed. for nausea   PRESERVISION AREDS PO Take 1  tablet by mouth daily.   Restasis 0.05 % ophthalmic emulsion Generic drug: cycloSPORINE Place 1 drop into both eyes 2 (two) times daily.   Rivaroxaban 15 MG Tabs tablet Commonly known as: Xarelto Take 1 tablet (15 mg total) by mouth every morning. What changed: when to take this   traZODone 50 MG tablet Commonly known as: DESYREL Take 50 mg by mouth at bedtime.   Trospium Chloride 60 MG Cp24 Take 1 capsule (60 mg total) by mouth at bedtime.        Allergies:  Allergies  Allergen Reactions   Levaquin [Levofloxacin In D5w] Itching    Lower extremities   Levofloxacin    Penicillins Nausea Only    Has patient had a PCN reaction  causing immediate rash, facial/tongue/throat swelling, SOB or lightheadedness with hypotension: No Has patient had a PCN reaction causing severe rash involving mucus membranes or skin necrosis: No Has patient had a PCN reaction that required hospitalization: No Has patient had a PCN reaction occurring within the last 10 years: No If all of the above answers are "NO", then may proceed with Cephalosporin use.    Sulfa Antibiotics Hives   Tequin [Gatifloxacin] Itching    Lower extremities   Latex Itching and Rash    Family History: Family History  Problem Relation Age of Onset   Hypertension Mother     Social History:  reports that she has never smoked. She has never used smokeless tobacco. She reports that she does not drink alcohol and does not use drugs.  ROS: All other review of systems were reviewed and are negative except what is noted above in HPI  Physical Exam: BP (!) 90/55   Pulse 87   Constitutional:  Alert and oriented, No acute distress. HEENT: [place] AT, moist mucus membranes.  Trachea midline, no masses. Cardiovascular: No clubbing, cyanosis, or edema. Respiratory: Normal respiratory effort, no increased work of breathing. GI: Abdomen is soft, nontender, nondistended, no abdominal masses GU: No CVA tenderness.  Lymph: No cervical or inguinal lymphadenopathy. Skin: No rashes, bruises or suspicious lesions. Neurologic: Grossly intact, no focal deficits, moving all 4 extremities. Psychiatric: Normal mood and affect.  Laboratory Data: Lab Results  Component Value Date   WBC 8.8 02/04/2021   HGB 11.6 (L) 02/04/2021   HCT 37.3 02/04/2021   MCV 86.7 02/04/2021   PLT 309 02/04/2021    Lab Results  Component Value Date   CREATININE 0.85 02/05/2021    No results found for: PSA  No results found for: TESTOSTERONE  No results found for: HGBA1C  Urinalysis    Component Value Date/Time   COLORURINE YELLOW 11/27/2018 1535   APPEARANCEUR Hazy (A) 11/16/2020 1342    LABSPEC 1.017 11/27/2018 1535   PHURINE 5.0 11/27/2018 1535   GLUCOSEU Negative 11/16/2020 1342   HGBUR NEGATIVE 11/27/2018 1535   BILIRUBINUR Negative 11/16/2020 1342   KETONESUR NEGATIVE 11/27/2018 1535   PROTEINUR Negative 11/16/2020 1342   PROTEINUR 100 (A) 11/27/2018 1535   UROBILINOGEN negative (A) 09/24/2019 1629   NITRITE Positive (A) 11/16/2020 1342   NITRITE NEGATIVE 11/27/2018 1535   LEUKOCYTESUR 3+ (A) 11/16/2020 1342   LEUKOCYTESUR MODERATE (A) 11/27/2018 1535    Lab Results  Component Value Date   LABMICR See below: 11/16/2020   WBCUA >30 (A) 11/16/2020   LABEPIT >10 (A) 11/16/2020   BACTERIA Many (A) 11/16/2020    Pertinent Imaging:  No results found for this or any previous visit.  No results found for this or  any previous visit.  No results found for this or any previous visit.  No results found for this or any previous visit.  No results found for this or any previous visit.  No results found for this or any previous visit.  No results found for this or any previous visit.  No results found for this or any previous visit.   Assessment & Plan:    1. Chronic cystitis with hematuria -Continue macrobid 50mg  qhs  2. OAB (overactive bladder) -Continue gemtesa 75mg  daily - Urinalysis, Routine w reflex microscopic   No follow-ups on file.  , MD  [facility] [place]

## 2021-04-27 NOTE — Patient Instructions (Signed)

## 2021-05-01 ENCOUNTER — Other Ambulatory Visit: Payer: Self-pay | Admitting: Urology

## 2021-05-01 DIAGNOSIS — N3281 Overactive bladder: Secondary | ICD-10-CM

## 2021-05-01 DIAGNOSIS — N3021 Other chronic cystitis with hematuria: Secondary | ICD-10-CM

## 2021-05-04 DIAGNOSIS — E1165 Type 2 diabetes mellitus with hyperglycemia: Secondary | ICD-10-CM | POA: Diagnosis not present

## 2021-05-04 DIAGNOSIS — K219 Gastro-esophageal reflux disease without esophagitis: Secondary | ICD-10-CM | POA: Diagnosis not present

## 2021-05-04 DIAGNOSIS — D519 Vitamin B12 deficiency anemia, unspecified: Secondary | ICD-10-CM | POA: Diagnosis not present

## 2021-05-16 DIAGNOSIS — H10413 Chronic giant papillary conjunctivitis, bilateral: Secondary | ICD-10-CM | POA: Diagnosis not present

## 2021-06-14 DIAGNOSIS — Z23 Encounter for immunization: Secondary | ICD-10-CM | POA: Diagnosis not present

## 2021-07-03 ENCOUNTER — Other Ambulatory Visit: Payer: Self-pay | Admitting: Urology

## 2021-07-03 DIAGNOSIS — N3021 Other chronic cystitis with hematuria: Secondary | ICD-10-CM

## 2021-07-03 DIAGNOSIS — N3281 Overactive bladder: Secondary | ICD-10-CM

## 2021-07-26 DIAGNOSIS — Z20822 Contact with and (suspected) exposure to covid-19: Secondary | ICD-10-CM | POA: Diagnosis not present

## 2021-07-29 DIAGNOSIS — Z23 Encounter for immunization: Secondary | ICD-10-CM | POA: Diagnosis not present

## 2021-08-30 ENCOUNTER — Other Ambulatory Visit: Payer: Self-pay | Admitting: Urology

## 2021-08-30 DIAGNOSIS — N3021 Other chronic cystitis with hematuria: Secondary | ICD-10-CM

## 2021-08-30 DIAGNOSIS — N3281 Overactive bladder: Secondary | ICD-10-CM

## 2021-08-31 DIAGNOSIS — Z20822 Contact with and (suspected) exposure to covid-19: Secondary | ICD-10-CM | POA: Diagnosis not present

## 2021-09-06 DIAGNOSIS — D519 Vitamin B12 deficiency anemia, unspecified: Secondary | ICD-10-CM | POA: Diagnosis not present

## 2021-09-06 DIAGNOSIS — E538 Deficiency of other specified B group vitamins: Secondary | ICD-10-CM | POA: Diagnosis not present

## 2021-10-04 DIAGNOSIS — E1165 Type 2 diabetes mellitus with hyperglycemia: Secondary | ICD-10-CM | POA: Diagnosis not present

## 2021-10-04 DIAGNOSIS — K219 Gastro-esophageal reflux disease without esophagitis: Secondary | ICD-10-CM | POA: Diagnosis not present

## 2021-10-14 DIAGNOSIS — E538 Deficiency of other specified B group vitamins: Secondary | ICD-10-CM | POA: Diagnosis not present

## 2021-10-27 ENCOUNTER — Other Ambulatory Visit: Payer: Self-pay | Admitting: Urology

## 2021-10-27 DIAGNOSIS — N3281 Overactive bladder: Secondary | ICD-10-CM

## 2021-10-27 DIAGNOSIS — N3021 Other chronic cystitis with hematuria: Secondary | ICD-10-CM

## 2021-10-28 ENCOUNTER — Ambulatory Visit: Payer: Medicare Other | Admitting: Urology

## 2021-10-31 ENCOUNTER — Ambulatory Visit: Payer: Medicare Other | Admitting: Urology

## 2021-10-31 DIAGNOSIS — N3021 Other chronic cystitis with hematuria: Secondary | ICD-10-CM

## 2021-11-04 DIAGNOSIS — D519 Vitamin B12 deficiency anemia, unspecified: Secondary | ICD-10-CM | POA: Diagnosis not present

## 2021-11-09 DIAGNOSIS — Z131 Encounter for screening for diabetes mellitus: Secondary | ICD-10-CM | POA: Diagnosis not present

## 2021-11-09 DIAGNOSIS — D649 Anemia, unspecified: Secondary | ICD-10-CM | POA: Diagnosis not present

## 2021-11-09 DIAGNOSIS — Z136 Encounter for screening for cardiovascular disorders: Secondary | ICD-10-CM | POA: Diagnosis not present

## 2021-11-09 DIAGNOSIS — R7301 Impaired fasting glucose: Secondary | ICD-10-CM | POA: Diagnosis not present

## 2021-11-10 ENCOUNTER — Emergency Department (HOSPITAL_COMMUNITY): Payer: No Typology Code available for payment source

## 2021-11-10 ENCOUNTER — Encounter (HOSPITAL_COMMUNITY): Payer: Self-pay | Admitting: *Deleted

## 2021-11-10 ENCOUNTER — Other Ambulatory Visit: Payer: Self-pay

## 2021-11-10 ENCOUNTER — Inpatient Hospital Stay (HOSPITAL_COMMUNITY)
Admission: EM | Admit: 2021-11-10 | Discharge: 2021-11-17 | DRG: 522 | Disposition: A | Discharge status: Skilled Nursing Facility | Payer: No Typology Code available for payment source | Attending: Internal Medicine | Admitting: Internal Medicine

## 2021-11-10 DIAGNOSIS — W0110XA Fall on same level from slipping, tripping and stumbling with subsequent striking against unspecified object, initial encounter: Secondary | ICD-10-CM | POA: Diagnosis present

## 2021-11-10 DIAGNOSIS — Y9301 Activity, walking, marching and hiking: Secondary | ICD-10-CM | POA: Diagnosis present

## 2021-11-10 DIAGNOSIS — N390 Urinary tract infection, site not specified: Secondary | ICD-10-CM | POA: Diagnosis present

## 2021-11-10 DIAGNOSIS — M25552 Pain in left hip: Secondary | ICD-10-CM | POA: Diagnosis not present

## 2021-11-10 DIAGNOSIS — G319 Degenerative disease of nervous system, unspecified: Secondary | ICD-10-CM | POA: Diagnosis not present

## 2021-11-10 DIAGNOSIS — Z882 Allergy status to sulfonamides status: Secondary | ICD-10-CM

## 2021-11-10 DIAGNOSIS — Z79899 Other long term (current) drug therapy: Secondary | ICD-10-CM

## 2021-11-10 DIAGNOSIS — S72002A Fracture of unspecified part of neck of left femur, initial encounter for closed fracture: Secondary | ICD-10-CM | POA: Diagnosis present

## 2021-11-10 DIAGNOSIS — I351 Nonrheumatic aortic (valve) insufficiency: Secondary | ICD-10-CM | POA: Diagnosis present

## 2021-11-10 DIAGNOSIS — K219 Gastro-esophageal reflux disease without esophagitis: Secondary | ICD-10-CM | POA: Diagnosis present

## 2021-11-10 DIAGNOSIS — W19XXXA Unspecified fall, initial encounter: Secondary | ICD-10-CM | POA: Diagnosis not present

## 2021-11-10 DIAGNOSIS — K589 Irritable bowel syndrome without diarrhea: Secondary | ICD-10-CM | POA: Diagnosis present

## 2021-11-10 DIAGNOSIS — Z881 Allergy status to other antibiotic agents status: Secondary | ICD-10-CM

## 2021-11-10 DIAGNOSIS — D62 Acute posthemorrhagic anemia: Secondary | ICD-10-CM

## 2021-11-10 DIAGNOSIS — S72012A Unspecified intracapsular fracture of left femur, initial encounter for closed fracture: Principal | ICD-10-CM | POA: Diagnosis present

## 2021-11-10 DIAGNOSIS — E871 Hypo-osmolality and hyponatremia: Secondary | ICD-10-CM | POA: Diagnosis present

## 2021-11-10 DIAGNOSIS — I700 Atherosclerosis of aorta: Secondary | ICD-10-CM | POA: Diagnosis not present

## 2021-11-10 DIAGNOSIS — Z66 Do not resuscitate: Secondary | ICD-10-CM | POA: Diagnosis present

## 2021-11-10 DIAGNOSIS — I4891 Unspecified atrial fibrillation: Secondary | ICD-10-CM | POA: Diagnosis not present

## 2021-11-10 DIAGNOSIS — S0990XA Unspecified injury of head, initial encounter: Secondary | ICD-10-CM | POA: Diagnosis not present

## 2021-11-10 DIAGNOSIS — Z7901 Long term (current) use of anticoagulants: Secondary | ICD-10-CM

## 2021-11-10 DIAGNOSIS — Z91040 Latex allergy status: Secondary | ICD-10-CM

## 2021-11-10 DIAGNOSIS — I440 Atrioventricular block, first degree: Secondary | ICD-10-CM | POA: Diagnosis present

## 2021-11-10 DIAGNOSIS — I447 Left bundle-branch block, unspecified: Secondary | ICD-10-CM | POA: Diagnosis present

## 2021-11-10 DIAGNOSIS — S0083XA Contusion of other part of head, initial encounter: Secondary | ICD-10-CM | POA: Diagnosis present

## 2021-11-10 DIAGNOSIS — J449 Chronic obstructive pulmonary disease, unspecified: Secondary | ICD-10-CM | POA: Diagnosis present

## 2021-11-10 DIAGNOSIS — Z880 Allergy status to penicillin: Secondary | ICD-10-CM

## 2021-11-10 DIAGNOSIS — Z96641 Presence of right artificial hip joint: Secondary | ICD-10-CM | POA: Diagnosis present

## 2021-11-10 DIAGNOSIS — I6529 Occlusion and stenosis of unspecified carotid artery: Secondary | ICD-10-CM | POA: Diagnosis not present

## 2021-11-10 DIAGNOSIS — N1831 Chronic kidney disease, stage 3a: Secondary | ICD-10-CM

## 2021-11-10 DIAGNOSIS — I4892 Unspecified atrial flutter: Secondary | ICD-10-CM | POA: Diagnosis present

## 2021-11-10 DIAGNOSIS — Y92000 Kitchen of unspecified non-institutional (private) residence as  the place of occurrence of the external cause: Secondary | ICD-10-CM

## 2021-11-10 DIAGNOSIS — Z90710 Acquired absence of both cervix and uterus: Secondary | ICD-10-CM

## 2021-11-10 DIAGNOSIS — Z8249 Family history of ischemic heart disease and other diseases of the circulatory system: Secondary | ICD-10-CM

## 2021-11-10 DIAGNOSIS — Z20822 Contact with and (suspected) exposure to covid-19: Secondary | ICD-10-CM | POA: Diagnosis present

## 2021-11-10 DIAGNOSIS — I4819 Other persistent atrial fibrillation: Secondary | ICD-10-CM | POA: Diagnosis present

## 2021-11-10 DIAGNOSIS — I482 Chronic atrial fibrillation, unspecified: Secondary | ICD-10-CM | POA: Diagnosis not present

## 2021-11-10 DIAGNOSIS — Z01810 Encounter for preprocedural cardiovascular examination: Secondary | ICD-10-CM

## 2021-11-10 DIAGNOSIS — I480 Paroxysmal atrial fibrillation: Secondary | ICD-10-CM

## 2021-11-10 DIAGNOSIS — N3281 Overactive bladder: Secondary | ICD-10-CM

## 2021-11-10 DIAGNOSIS — D72829 Elevated white blood cell count, unspecified: Secondary | ICD-10-CM

## 2021-11-10 DIAGNOSIS — I129 Hypertensive chronic kidney disease with stage 1 through stage 4 chronic kidney disease, or unspecified chronic kidney disease: Secondary | ICD-10-CM | POA: Diagnosis present

## 2021-11-10 LAB — CBC WITH DIFFERENTIAL/PLATELET
Abs Immature Granulocytes: 0.1 10*3/uL — ABNORMAL HIGH (ref 0.00–0.07)
Basophils Absolute: 0.1 10*3/uL (ref 0.0–0.1)
Basophils Relative: 0 %
Eosinophils Absolute: 0.2 10*3/uL (ref 0.0–0.5)
Eosinophils Relative: 1 %
HCT: 37.3 % (ref 36.0–46.0)
Hemoglobin: 12 g/dL (ref 12.0–15.0)
Immature Granulocytes: 1 %
Lymphocytes Relative: 31 %
Lymphs Abs: 4.3 10*3/uL — ABNORMAL HIGH (ref 0.7–4.0)
MCH: 28.1 pg (ref 26.0–34.0)
MCHC: 32.2 g/dL (ref 30.0–36.0)
MCV: 87.4 fL (ref 80.0–100.0)
Monocytes Absolute: 0.9 10*3/uL (ref 0.1–1.0)
Monocytes Relative: 7 %
Neutro Abs: 8.3 10*3/uL — ABNORMAL HIGH (ref 1.7–7.7)
Neutrophils Relative %: 60 %
Platelets: 337 10*3/uL (ref 150–400)
RBC: 4.27 MIL/uL (ref 3.87–5.11)
RDW: 15.6 % — ABNORMAL HIGH (ref 11.5–15.5)
WBC: 13.8 10*3/uL — ABNORMAL HIGH (ref 4.0–10.5)
nRBC: 0 % (ref 0.0–0.2)

## 2021-11-10 LAB — COMPREHENSIVE METABOLIC PANEL
ALT: 20 U/L (ref 0–44)
AST: 23 U/L (ref 15–41)
Albumin: 4 g/dL (ref 3.5–5.0)
Alkaline Phosphatase: 77 U/L (ref 38–126)
Anion gap: 11 (ref 5–15)
BUN: 23 mg/dL (ref 8–23)
CO2: 19 mmol/L — ABNORMAL LOW (ref 22–32)
Calcium: 8.8 mg/dL — ABNORMAL LOW (ref 8.9–10.3)
Chloride: 104 mmol/L (ref 98–111)
Creatinine, Ser: 1.04 mg/dL — ABNORMAL HIGH (ref 0.44–1.00)
GFR, Estimated: 52 mL/min — ABNORMAL LOW (ref >60)
Glucose, Bld: 105 mg/dL — ABNORMAL HIGH (ref 70–99)
Potassium: 3.8 mmol/L (ref 3.5–5.1)
Sodium: 134 mmol/L — ABNORMAL LOW (ref 135–145)
Total Bilirubin: 0.5 mg/dL (ref 0.3–1.2)
Total Protein: 7.3 g/dL (ref 6.5–8.1)

## 2021-11-10 MED ORDER — ONDANSETRON HCL 4 MG/2ML IJ SOLN
4.0000 mg | Freq: Once | INTRAMUSCULAR | Status: AC
  Administered 2021-11-10: 23:00:00 4 mg via INTRAVENOUS
  Filled 2021-11-10: qty 2

## 2021-11-10 MED ORDER — SODIUM CHLORIDE 0.9 % IV SOLN: INTRAVENOUS | Status: DC

## 2021-11-10 MED ORDER — HYDROMORPHONE HCL 1 MG/ML IJ SOLN
0.5000 mg | Freq: Once | INTRAMUSCULAR | Status: AC
  Administered 2021-11-10: 23:00:00 0.5 mg via INTRAVENOUS
  Filled 2021-11-10: qty 1

## 2021-11-10 NOTE — ED Triage Notes (Addendum)
Pt fell at home, now with c/o left hip pain.  Reported that pt in Afib with RVR, pt with hx of Aflutter. ?

## 2021-11-10 NOTE — ED Provider Notes (Signed)
[facility] Provider Note   CSN: [id number] Arrival date & time: 11/10/21  2111     History  Chief Complaint  Patient presents with   Fall   Atrial Fibrillation    Diana RoyaltyJonita Mata is a 88 y.o. female.  Patient with a fall around 830 tonight.  Patient with complaint of left hip pain.  Brought in by EMS EMS gave her morphine in route.  Patient also noted to be in atrial fibrillation with RVR.  However not in RVR here.  She is in an atrial flutter and atrial fib patient has a history of that.  Patient is on the blood thinner Xarelto for that.  And patient is on Cardizem for that as well.  Heart rates controlled here.  Patient's blood pressure good 130/56 oxygen saturations on room air in the upper 90s.  Temp is 98.2.  Patient also states she hit her head when she fell.  No other complaints.  Patient has some bruising to the left side of her face is from from oral surgery that was done recently.  But not related to today's fall.  Past medical history significant for atrial flutter gastroesophageal reflux disease chronic urinary tract infections irritable bowel syndrome and COPD.  Past surgical history significant for abdominal hysterectomy hip joint replacement on the right side many years ago around 2005 [place].  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  Chart review shows that in June patient was admitted here for atrial fibrillation with rapid response.      Home Medications Prior to Admission medications   Medication Sig Start Date End Date Taking? Authorizing Provider  albuterol (PROVENTIL HFA;VENTOLIN HFA) 108 (90 Base) MCG/ACT inhaler Inhale 1-2 puffs into the lungs every 6 (six) hours as needed for wheezing or shortness of breath.    [provider]  diazepam (VALIUM) 5 MG tablet Take 5-10 mg by mouth every 6 (six) hours as needed for anxiety.    [provider]  diltiazem (CARDIZEM) 30 MG tablet Take 1 tablet by mouth twice daily 04/20/20    Iran OuchStrader, GrenadaBrittany M, PA-C  escitalopram (LEXAPRO) 5 MG tablet Take 5 mg by mouth daily. 11/20/18   [provider]  fluticasone (FLONASE) 50 MCG/ACT nasal spray Place 1 spray into both nostrils daily. 09/25/19   [provider]  HYDROcodone-acetaminophen (NORCO/VICODIN) 5-325 MG tablet Take 1 tablet by mouth every 4 (four) hours as needed.    [provider]  Iron-FA-B Cmp-C-Biot-Probiotic (FUSION PLUS PO) Take 1 capsule by mouth daily.    [provider]  loperamide (IMODIUM) 2 MG capsule Take 2 mg by mouth as needed for diarrhea or loose stools.    [provider]  meclizine (ANTIVERT) 25 MG tablet Take 25 mg by mouth every morning. 09/25/19   [provider]  Multiple Vitamins-Minerals (PRESERVISION AREDS PO) Take 1 tablet by mouth daily.    [provider]  nitrofurantoin (MACRODANTIN) 50 MG capsule TAKE ONE CAPSULE BY MOUTH EVERYDAY AT BEDTIME 04/27/21   McKenzie, Mardene CelestePatrick L, MD  omeprazole (PRILOSEC) 20 MG capsule Take 20 mg by mouth daily.    [provider]  ondansetron (ZOFRAN) 4 MG tablet Take 4 mg by mouth every 6 (six) hours as needed. for nausea 09/27/18   [provider]  RESTASIS 0.05 % ophthalmic emulsion Place 1 drop into both eyes 2 (two) times daily. 09/16/18   [provider]  Rivaroxaban (XARELTO) 15 MG TABS tablet Take 1 tablet (15 mg  total) by mouth every morning. Patient taking differently: Take 15 mg by mouth every evening. 09/12/18   Strader, Lennart Pall, PA-C  traZODone (DESYREL) 50 MG tablet Take 50 mg by mouth at bedtime.    [provider]  Trospium Chloride 60 MG CP24 TAKE ONE CAPSULE BY MOUTH EVERYDAY AT BEDTIME 11/03/21   McKenzie, Mardene Celeste, MD  Vibegron (GEMTESA) 75 MG TABS Take 1 capsule by mouth daily. 04/27/21   McKenzie, Mardene Celeste, MD      Allergies    Levaquin [levofloxacin in d5w], Levofloxacin, Penicillins, Sulfa antibiotics, Tequin [gatifloxacin], and Latex    Review  of Systems   Review of Systems  Constitutional:  Negative for chills and fever.  HENT:  Negative for ear pain and sore throat.   Eyes:  Negative for pain and visual disturbance.  Respiratory:  Negative for cough and shortness of breath.   Cardiovascular:  Negative for chest pain and palpitations.  Gastrointestinal:  Negative for abdominal pain and vomiting.  Genitourinary:  Negative for dysuria and hematuria.  Musculoskeletal:  Negative for arthralgias and back pain.  Skin:  Negative for color change and rash.  Neurological:  Negative for seizures and syncope.  Hematological:  Bruises/bleeds easily.  All other systems reviewed and are negative.  Physical Exam Updated Vital Signs BP (!) 133/52    Pulse 85    Temp 98.2 F (36.8 C) (Oral)    Resp 17    Ht 1.626 m (5\' 4" )    SpO2 95%    BMI 21.11 kg/m  Physical Exam Vitals and nursing note reviewed.  Constitutional:      General: She is not in acute distress.    Appearance: Normal appearance. She is well-developed.  HENT:     Head: Normocephalic.     Comments: Bruising that is dark left side of the jaw all chin area.  According to daughter this is old.  From some oral surgery that was done. Eyes:     Conjunctiva/sclera: Conjunctivae normal.     Pupils: Pupils are equal, round, and reactive to light.  Cardiovascular:     Rate and Rhythm: Normal rate and regular rhythm.     Heart sounds: No murmur heard. Pulmonary:     Effort: Pulmonary effort is normal. No respiratory distress.     Breath sounds: Normal breath sounds.  Abdominal:     Palpations: Abdomen is soft.     Tenderness: There is no abdominal tenderness.  Musculoskeletal:        General: Tenderness and deformity present. No swelling.     Cervical back: Normal range of motion and neck supple.     Comments: Tenderness to palpation left hip area.  Left lower extremity is shortened.  Dorsalis pedis pulse is in both feet is 2+.  Sensation intact in both feet.  Good movement of  the toes.  Skin:    General: Skin is warm and dry.     Capillary Refill: Capillary refill takes less than 2 seconds.  Neurological:     General: No focal deficit present.     Mental Status: She is alert and oriented to person, place, and time.     Cranial Nerves: No cranial nerve deficit.     Sensory: No sensory deficit.  Psychiatric:        Mood and Affect: Mood normal.    ED Results / Procedures / Treatments   Labs (all labs ordered are listed, but only abnormal results are displayed) Labs Reviewed  RESP PANEL BY RT-PCR (FLU A&B, COVID) [id number]  COMPREHENSIVE METABOLIC PANEL  CBC WITH DIFFERENTIAL/PLATELET  URINALYSIS, ROUTINE W REFLEX MICROSCOPIC    EKG EKG Interpretation  Date/Time:  Thursday November 10 2021 21:19:54 EST Ventricular Rate:  90 PR Interval:    QRS Duration: 127 QT Interval:  407 QTC Calculation: 498 R Axis:   72 Text Interpretation: Atrial flutter with predominant 3:1 AV block IVCD, consider atypical LBBB Baseline wander in lead(s) V2 Confirmed by Vanetta Mulders [phone]) on 11/10/2021 9:56:15 PM  Radiology No results found.  Procedures Procedures    Medications Ordered in ED Medications  0.9 %  sodium chloride infusion (has no administration in time range)    ED Course/ Medical Decision Making/ A&P                           Medical Decision Making Amount and/or Complexity of Data Reviewed Labs: ordered. Radiology: ordered.  Risk Prescription drug management. Decision regarding hospitalization.   Clinically patient has a left hip fracture.  Will get x-ray of the left hip and pelvis.  We will also get labs in preparation for her going to the operating room.  Patient rhythm is consistent with atrial flutter.  Currently rate controlled.  But patient is on the blood thinner Xarelto.  We will get chest x-ray as well in preparation for surgery.  We will get CT head since she states she hit her head just to make sure there is no brain  injury.  Patient last had her Xarelto today.  Patient's x-ray confirms a subcapital left hip fracture.  Chest x-ray negative head CT negative.  Comprehensive metabolic panel sodium 134 CO2 19 glucose 105 GFR is 52.  CBC mild leukocytosis with a white count of 13.8 hemoglobin 12.0.  Platelets are normal.  Discussed with Dr. Dallas Schimke orthopedics he will see her in consultation.  Recommending hospitalist admission.  Due to the fact that she been on Xarelto will probably not better have surgery till Saturday  Final Clinical Impression(s) / ED Diagnoses Final diagnoses:  Fall, initial encounter  Injury of head, initial encounter    Rx / DC Orders ED Discharge Orders     None         Vanetta Mulders, MD 11/10/21 2345

## 2021-11-10 NOTE — ED Notes (Signed)
EMS gave pt 4mg  and 2 mg of Morphine en route here for pain. ?

## 2021-11-11 DIAGNOSIS — D62 Acute posthemorrhagic anemia: Secondary | ICD-10-CM | POA: Diagnosis not present

## 2021-11-11 DIAGNOSIS — I447 Left bundle-branch block, unspecified: Secondary | ICD-10-CM | POA: Diagnosis not present

## 2021-11-11 DIAGNOSIS — K219 Gastro-esophageal reflux disease without esophagitis: Secondary | ICD-10-CM | POA: Diagnosis not present

## 2021-11-11 DIAGNOSIS — R41841 Cognitive communication deficit: Secondary | ICD-10-CM | POA: Diagnosis not present

## 2021-11-11 DIAGNOSIS — I10 Essential (primary) hypertension: Secondary | ICD-10-CM | POA: Diagnosis not present

## 2021-11-11 DIAGNOSIS — R2681 Unsteadiness on feet: Secondary | ICD-10-CM | POA: Diagnosis not present

## 2021-11-11 DIAGNOSIS — Z01810 Encounter for preprocedural cardiovascular examination: Secondary | ICD-10-CM | POA: Diagnosis not present

## 2021-11-11 DIAGNOSIS — S72002D Fracture of unspecified part of neck of left femur, subsequent encounter for closed fracture with routine healing: Secondary | ICD-10-CM | POA: Diagnosis not present

## 2021-11-11 DIAGNOSIS — S0083XA Contusion of other part of head, initial encounter: Secondary | ICD-10-CM | POA: Diagnosis not present

## 2021-11-11 DIAGNOSIS — Z882 Allergy status to sulfonamides status: Secondary | ICD-10-CM | POA: Diagnosis not present

## 2021-11-11 DIAGNOSIS — Y9301 Activity, walking, marching and hiking: Secondary | ICD-10-CM | POA: Diagnosis present

## 2021-11-11 DIAGNOSIS — I440 Atrioventricular block, first degree: Secondary | ICD-10-CM | POA: Diagnosis not present

## 2021-11-11 DIAGNOSIS — Z90710 Acquired absence of both cervix and uterus: Secondary | ICD-10-CM | POA: Diagnosis not present

## 2021-11-11 DIAGNOSIS — R262 Difficulty in walking, not elsewhere classified: Secondary | ICD-10-CM | POA: Diagnosis not present

## 2021-11-11 DIAGNOSIS — S72002A Fracture of unspecified part of neck of left femur, initial encounter for closed fracture: Secondary | ICD-10-CM | POA: Diagnosis not present

## 2021-11-11 DIAGNOSIS — N390 Urinary tract infection, site not specified: Secondary | ICD-10-CM | POA: Diagnosis not present

## 2021-11-11 DIAGNOSIS — Z880 Allergy status to penicillin: Secondary | ICD-10-CM | POA: Diagnosis not present

## 2021-11-11 DIAGNOSIS — K589 Irritable bowel syndrome without diarrhea: Secondary | ICD-10-CM | POA: Diagnosis not present

## 2021-11-11 DIAGNOSIS — I4892 Unspecified atrial flutter: Secondary | ICD-10-CM | POA: Diagnosis not present

## 2021-11-11 DIAGNOSIS — S72012A Unspecified intracapsular fracture of left femur, initial encounter for closed fracture: Secondary | ICD-10-CM | POA: Diagnosis not present

## 2021-11-11 DIAGNOSIS — E871 Hypo-osmolality and hyponatremia: Secondary | ICD-10-CM | POA: Diagnosis not present

## 2021-11-11 DIAGNOSIS — I4819 Other persistent atrial fibrillation: Secondary | ICD-10-CM | POA: Diagnosis not present

## 2021-11-11 DIAGNOSIS — N3281 Overactive bladder: Secondary | ICD-10-CM | POA: Diagnosis not present

## 2021-11-11 DIAGNOSIS — D649 Anemia, unspecified: Secondary | ICD-10-CM

## 2021-11-11 DIAGNOSIS — Z471 Aftercare following joint replacement surgery: Secondary | ICD-10-CM | POA: Diagnosis not present

## 2021-11-11 DIAGNOSIS — Z66 Do not resuscitate: Secondary | ICD-10-CM | POA: Diagnosis not present

## 2021-11-11 DIAGNOSIS — W19XXXA Unspecified fall, initial encounter: Secondary | ICD-10-CM

## 2021-11-11 DIAGNOSIS — Z8249 Family history of ischemic heart disease and other diseases of the circulatory system: Secondary | ICD-10-CM | POA: Diagnosis not present

## 2021-11-11 DIAGNOSIS — R9431 Abnormal electrocardiogram [ECG] [EKG]: Secondary | ICD-10-CM | POA: Diagnosis not present

## 2021-11-11 DIAGNOSIS — M6281 Muscle weakness (generalized): Secondary | ICD-10-CM | POA: Diagnosis not present

## 2021-11-11 DIAGNOSIS — I480 Paroxysmal atrial fibrillation: Secondary | ICD-10-CM

## 2021-11-11 DIAGNOSIS — Z8679 Personal history of other diseases of the circulatory system: Secondary | ICD-10-CM | POA: Diagnosis not present

## 2021-11-11 DIAGNOSIS — Z96642 Presence of left artificial hip joint: Secondary | ICD-10-CM | POA: Diagnosis not present

## 2021-11-11 DIAGNOSIS — Z741 Need for assistance with personal care: Secondary | ICD-10-CM | POA: Diagnosis not present

## 2021-11-11 DIAGNOSIS — Z9181 History of falling: Secondary | ICD-10-CM | POA: Diagnosis not present

## 2021-11-11 DIAGNOSIS — D72829 Elevated white blood cell count, unspecified: Secondary | ICD-10-CM

## 2021-11-11 DIAGNOSIS — I351 Nonrheumatic aortic (valve) insufficiency: Secondary | ICD-10-CM | POA: Diagnosis not present

## 2021-11-11 DIAGNOSIS — I129 Hypertensive chronic kidney disease with stage 1 through stage 4 chronic kidney disease, or unspecified chronic kidney disease: Secondary | ICD-10-CM | POA: Diagnosis not present

## 2021-11-11 DIAGNOSIS — J449 Chronic obstructive pulmonary disease, unspecified: Secondary | ICD-10-CM | POA: Diagnosis not present

## 2021-11-11 DIAGNOSIS — W0110XA Fall on same level from slipping, tripping and stumbling with subsequent striking against unspecified object, initial encounter: Secondary | ICD-10-CM | POA: Diagnosis present

## 2021-11-11 DIAGNOSIS — N1831 Chronic kidney disease, stage 3a: Secondary | ICD-10-CM | POA: Diagnosis not present

## 2021-11-11 DIAGNOSIS — Y92000 Kitchen of unspecified non-institutional (private) residence as  the place of occurrence of the external cause: Secondary | ICD-10-CM | POA: Diagnosis not present

## 2021-11-11 DIAGNOSIS — Z96641 Presence of right artificial hip joint: Secondary | ICD-10-CM | POA: Diagnosis not present

## 2021-11-11 DIAGNOSIS — Z20822 Contact with and (suspected) exposure to covid-19: Secondary | ICD-10-CM | POA: Diagnosis not present

## 2021-11-11 DIAGNOSIS — M25552 Pain in left hip: Secondary | ICD-10-CM | POA: Diagnosis not present

## 2021-11-11 DIAGNOSIS — I4891 Unspecified atrial fibrillation: Secondary | ICD-10-CM | POA: Diagnosis not present

## 2021-11-11 LAB — CBC
HCT: 31.9 % — ABNORMAL LOW (ref 36.0–46.0)
Hemoglobin: 9.6 g/dL — ABNORMAL LOW (ref 12.0–15.0)
MCH: 26.7 pg (ref 26.0–34.0)
MCHC: 30.1 g/dL (ref 30.0–36.0)
MCV: 88.6 fL (ref 80.0–100.0)
Platelets: 263 10*3/uL (ref 150–400)
RBC: 3.6 MIL/uL — ABNORMAL LOW (ref 3.87–5.11)
RDW: 15.5 % (ref 11.5–15.5)
WBC: 11.4 10*3/uL — ABNORMAL HIGH (ref 4.0–10.5)
nRBC: 0 % (ref 0.0–0.2)

## 2021-11-11 LAB — RESP PANEL BY RT-PCR (FLU A&B, COVID) ARPGX2
Influenza A by PCR: NEGATIVE
Influenza B by PCR: NEGATIVE
SARS Coronavirus 2 by RT PCR: NEGATIVE

## 2021-11-11 LAB — URINALYSIS, ROUTINE W REFLEX MICROSCOPIC
Bilirubin Urine: NEGATIVE
Glucose, UA: NEGATIVE mg/dL
Ketones, ur: 5 mg/dL — AB
Nitrite: NEGATIVE
Protein, ur: NEGATIVE mg/dL
Specific Gravity, Urine: 1.019 (ref 1.005–1.030)
pH: 6 (ref 5.0–8.0)

## 2021-11-11 LAB — COMPREHENSIVE METABOLIC PANEL
ALT: 43 U/L (ref 0–44)
AST: 66 U/L — ABNORMAL HIGH (ref 15–41)
Albumin: 3.2 g/dL — ABNORMAL LOW (ref 3.5–5.0)
Alkaline Phosphatase: 78 U/L (ref 38–126)
Anion gap: 8 (ref 5–15)
BUN: 21 mg/dL (ref 8–23)
CO2: 19 mmol/L — ABNORMAL LOW (ref 22–32)
Calcium: 8.2 mg/dL — ABNORMAL LOW (ref 8.9–10.3)
Chloride: 108 mmol/L (ref 98–111)
Creatinine, Ser: 1 mg/dL (ref 0.44–1.00)
GFR, Estimated: 54 mL/min — ABNORMAL LOW (ref >60)
Glucose, Bld: 99 mg/dL (ref 70–99)
Potassium: 3.8 mmol/L (ref 3.5–5.1)
Sodium: 135 mmol/L (ref 135–145)
Total Bilirubin: 0.5 mg/dL (ref 0.3–1.2)
Total Protein: 5.8 g/dL — ABNORMAL LOW (ref 6.5–8.1)

## 2021-11-11 LAB — SURGICAL PCR SCREEN
MRSA, PCR: POSITIVE — AB
Staphylococcus aureus: POSITIVE — AB

## 2021-11-11 LAB — MAGNESIUM: Magnesium: 2 mg/dL (ref 1.7–2.4)

## 2021-11-11 LAB — PHOSPHORUS: Phosphorus: 3.5 mg/dL (ref 2.5–4.6)

## 2021-11-11 MED ORDER — CEFAZOLIN SODIUM-DEXTROSE 2-4 GM/100ML-% IV SOLN: 2.0000 g | INTRAVENOUS | Status: DC

## 2021-11-11 MED ORDER — MUPIROCIN 2 % EX OINT
1.0000 "application " | TOPICAL_OINTMENT | Freq: Two times a day (BID) | CUTANEOUS | Status: AC
  Administered 2021-11-11 – 2021-11-16 (×9): 1 via NASAL
  Filled 2021-11-11: qty 22

## 2021-11-11 MED ORDER — ENSURE ENLIVE PO LIQD
237.0000 mL | Freq: Two times a day (BID) | ORAL | Status: DC
  Administered 2021-11-11 – 2021-11-17 (×8): 237 mL via ORAL

## 2021-11-11 MED ORDER — CHLORHEXIDINE GLUCONATE CLOTH 2 % EX PADS
6.0000 | MEDICATED_PAD | Freq: Every day | CUTANEOUS | Status: AC
  Administered 2021-11-12: 6 via TOPICAL

## 2021-11-11 MED ORDER — FENTANYL CITRATE PF 50 MCG/ML IJ SOSY
25.0000 ug | PREFILLED_SYRINGE | INTRAMUSCULAR | Status: DC | PRN
  Administered 2021-11-11 – 2021-11-13 (×7): 25 ug via INTRAVENOUS
  Filled 2021-11-11 (×8): qty 1

## 2021-11-11 MED ORDER — TRANEXAMIC ACID-NACL 1000-0.7 MG/100ML-% IV SOLN
1000.0000 mg | INTRAVENOUS | Status: AC
  Administered 2021-11-12 (×2): 1000 mg via INTRAVENOUS
  Filled 2021-11-11: qty 100

## 2021-11-11 MED ORDER — CHLORHEXIDINE GLUCONATE CLOTH 2 % EX PADS
6.0000 | MEDICATED_PAD | Freq: Every day | CUTANEOUS | Status: AC
Start: 2021-11-11 — End: 2021-11-11
  Administered 2021-11-11: 6 via TOPICAL

## 2021-11-11 MED ORDER — TRANEXAMIC ACID-NACL 1000-0.7 MG/100ML-% IV SOLN: 1000.0000 mg | INTRAVENOUS | Status: DC

## 2021-11-11 MED ORDER — DILTIAZEM HCL 30 MG PO TABS
30.0000 mg | ORAL_TABLET | Freq: Two times a day (BID) | ORAL | Status: DC
  Administered 2021-11-11 (×2): 30 mg via ORAL
  Filled 2021-11-11 (×2): qty 1

## 2021-11-11 MED ORDER — HYDROCODONE-ACETAMINOPHEN 5-325 MG PO TABS
1.0000 | ORAL_TABLET | Freq: Four times a day (QID) | ORAL | Status: DC | PRN
  Administered 2021-11-11 – 2021-11-13 (×6): 1 via ORAL
  Filled 2021-11-11 (×7): qty 1

## 2021-11-11 MED ORDER — CEFAZOLIN SODIUM-DEXTROSE 2-4 GM/100ML-% IV SOLN
2.0000 g | INTRAVENOUS | Status: AC
Start: 2021-11-12 — End: 2021-11-12
  Administered 2021-11-12: 2 g via INTRAVENOUS
  Filled 2021-11-11: qty 100

## 2021-11-11 MED ORDER — PANTOPRAZOLE SODIUM 40 MG PO TBEC
40.0000 mg | DELAYED_RELEASE_TABLET | Freq: Every day | ORAL | Status: DC
Start: 2021-11-11 — End: 2021-11-17
  Administered 2021-11-11 – 2021-11-17 (×6): 40 mg via ORAL
  Filled 2021-11-11 (×6): qty 1

## 2021-11-11 MED ORDER — METOPROLOL TARTRATE 5 MG/5ML IV SOLN
5.0000 mg | Freq: Once | INTRAVENOUS | Status: AC
  Administered 2021-11-11: 5 mg via INTRAVENOUS
  Filled 2021-11-11: qty 5

## 2021-11-11 NOTE — Assessment & Plan Note (Signed)
Continue fall precaution and neurochecks Consider PT/OT eval and treat status post surgical repair of femoral neck fracture

## 2021-11-11 NOTE — Assessment & Plan Note (Addendum)
Currently in sinus rhythm Having more paroxsyms of afib RVR Intermittent doses lopressor IV--last dose on 3/13 1429 Increase diltiazem to q 6 hours; distal inf HK 11/13/21 Echo EF 60-65%, trivival MR, AI, mod TR Restart Xarelto in am if Hgb remains stable Transition to cardizem cd 3/14

## 2021-11-11 NOTE — H&P (Signed)
History and Physical    Patient: Diana RoyaltyJonita Cheek-Eurich WUJ:[id number] DOB: 1931/01/22 DOA: 11/10/2021 DOS: the patient was seen and examined on 11/11/2021 PCP: Benita StabileHall, John Z, MD  Patient coming from: Home  Chief Complaint:  Chief Complaint  Patient presents with   Fall   Atrial Fibrillation   HPI: Diana RoyaltyJonita Cheek-Eurich is a 88 y.o. female with medical history significant of  atrial flutter, COPD, atrial fibrillation, GERD who presented to the emergency department due to left hip pain.  Patient states that she lives alone and said that she sustained a fall at home and that she hit her head (unable to tell me how he fell), she activated EMS who noted her to be in A-fib with RVR which resolved prior to arrival to the ED.  She denies chest pain, shortness of breath, headache, nausea, vomiting. Patient was admitted to this facility between 02/04/2021 to 02/05/2021 due to A-fib with RVR.  ED course: In the emergency department, she was hemodynamically stable, BP was soft at 102/42, but other vital signs were within normal range.  Work-up in the ED showed normal CBC except for leukocytosis, mild hyponatremia.  Influenza A, B, SARS coronavirus 2 was negative. CT head without contrast showed no CT evidence for acute intracranial abnormality Chest x-ray showed no active disease Left hip x-ray showed left subcapital femoral neck fracture She was treated with Dilaudid and Zofran, IV hydration was provided. Dr.Cairns (orthopedic surgeon) was consulted and will follow up with patient in the morning per ED physician.  Patient's surgery may have to wait till Saturday due to patient being on Xarelto with last dose being yesterday (3/9)  Review of Systems: As mentioned in the history of present illness. All other systems reviewed and are negative.   Past Medical History:  Diagnosis Date   Atrial flutter (HCC)    Chronic UTI    COPD (chronic obstructive pulmonary disease) (HCC)    Dizziness    GERD  (gastroesophageal reflux disease)    IBS (irritable bowel syndrome)    Renal disorder    renal insufficiency   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     JOINT REPLACEMENT     Social History:  reports that she has never smoked. She has never used smokeless tobacco. She reports that she does not drink alcohol and does not use drugs.  Allergies  Allergen Reactions   Levaquin [Levofloxacin In D5w] Itching    Lower extremities   Levofloxacin    Penicillins Nausea Only    Has patient had a PCN reaction causing immediate rash, facial/tongue/throat swelling, SOB or lightheadedness with hypotension: No Has patient had a PCN reaction causing severe rash involving mucus membranes or skin necrosis: No Has patient had a PCN reaction that required hospitalization: No Has patient had a PCN reaction occurring within the last 10 years: No If all of the above answers are "NO", then may proceed with Cephalosporin use.    Sulfa Antibiotics Hives   Tequin [Gatifloxacin] Itching    Lower extremities   Latex Itching and Rash    Family History  Problem Relation Age of Onset   Hypertension Mother     Prior to Admission medications   Medication Sig Start Date End Date Taking? Authorizing Provider  albuterol (PROVENTIL HFA;VENTOLIN HFA) 108 (90 Base) MCG/ACT inhaler Inhale 1-2 puffs into the lungs every 6 (six) hours as needed for wheezing or shortness of breath.  [provider]  diazepam (VALIUM) 5 MG tablet Take 5-10 mg by mouth every 6 (six) hours as needed for anxiety.    [provider]  diltiazem (CARDIZEM) 30 MG tablet Take 1 tablet by mouth twice daily 04/20/20   [place] Ouch, [place] M, PA-C  escitalopram (LEXAPRO) 5 MG tablet Take 5 mg by mouth daily. 11/20/18   [provider]  fluticasone (FLONASE) 50 MCG/ACT nasal spray Place 1 spray into both nostrils daily. 09/25/19   [provider]   HYDROcodone-acetaminophen (NORCO/VICODIN) 5-325 MG tablet Take 1 tablet by mouth every 4 (four) hours as needed.    [provider]  Iron-FA-B Cmp-C-Biot-Probiotic (FUSION PLUS PO) Take 1 capsule by mouth daily.    [provider]  loperamide (IMODIUM) 2 MG capsule Take 2 mg by mouth as needed for diarrhea or loose stools.    [provider]  meclizine (ANTIVERT) 25 MG tablet Take 25 mg by mouth every morning. 09/25/19   [provider]  Multiple Vitamins-Minerals (PRESERVISION AREDS PO) Take 1 tablet by mouth daily.    [provider]  nitrofurantoin (MACRODANTIN) 50 MG capsule TAKE ONE CAPSULE BY MOUTH EVERYDAY AT BEDTIME 04/27/21   McKenzie, Mardene Celeste, MD  omeprazole (PRILOSEC) 20 MG capsule Take 20 mg by mouth daily.    [provider]  ondansetron (ZOFRAN) 4 MG tablet Take 4 mg by mouth every 6 (six) hours as needed. for nausea 09/27/18   [provider]  RESTASIS 0.05 % ophthalmic emulsion Place 1 drop into both eyes 2 (two) times daily. 09/16/18   [provider]  Rivaroxaban (XARELTO) 15 MG TABS tablet Take 1 tablet (15 mg total) by mouth every morning. Patient taking differently: Take 15 mg by mouth every evening. 09/12/18   Strader, Lennart Pall, PA-C  traZODone (DESYREL) 50 MG tablet Take 50 mg by mouth at bedtime.    [provider]  Trospium Chloride 60 MG CP24 TAKE ONE CAPSULE BY MOUTH EVERYDAY AT BEDTIME 11/03/21   McKenzie, Mardene Celeste, MD  Vibegron (GEMTESA) 75 MG TABS Take 1 capsule by mouth daily. 04/27/21   Malen Gauze, MD    Physical Exam: Vitals:   11/11/21 0100 11/11/21 0130 11/11/21 0200 11/11/21 0230  BP: (!) 108/48 (!) 129/47 (!) 116/45   Pulse: 80 85 78   Resp: 18 14 18 14   Temp:      TempSrc:      SpO2: 95% 93% 96%   Height:       General: Elderly female. Awake and alert and oriented x3. Not in any acute distress.  HEENT: NCAT.  PERRLA. EOMI. Sclerae anicteric.  Moist mucosal  membranes. Neck: Neck supple without lymphadenopathy. No carotid bruits. No masses palpated.  Cardiovascular: Regular rate with normal S1-S2 sounds. No murmurs, rubs or gallops auscultated. No JVD.  Respiratory: Clear breath sounds.  No accessory muscle use. Abdomen: Soft, nontender, nondistended. Active bowel sounds. No masses or hepatosplenomegaly  Skin: No rashes, lesions, or ulcerations.  Dry, warm to touch. Musculoskeletal: Tender to palpation of left hip.  Shortened left hip with external rotation.  Restricted ROM of RLE due to pain.  2+ dorsalis pedis and radial pulses.   Psychiatric:  Mood appropriate to current condition. Neurologic: No focal neurological deficits. CN II - XII grossly intact.  Data Reviewed: EKG personally reviewed showed atrial flutter at a rate of 90 bpm with QTc 498 ms  Assessment and Plan: * Closed displaced fracture of left femoral neck (HCC) Left  hip x-ray showed left subcapital femoral neck fracture Continue Norco 5-325mg  p.o. every 6 hours as needed for pain Continue fall precaution and neurochecks Consider PT/OT eval and treat status post surgical repair of femoral neck fracture Orthopedic surgeon was already consulted by ED physician I will follow-up with patient in the morning  Fall Continue fall precaution and neurochecks Consider PT/OT eval and treat status post surgical repair of femoral neck fracture  Prolonged QT interval QTc Avoid QT prolonging drugs Magnesium level will be checked Repeat EKG in the morning   Leukocytosis WBC 13.8, this may be reactive  History of atrial fibrillation Continue home Cardizem Xarelto will be held in the hospital for possible surgical repair of femoral neck fracture  GERD (gastroesophageal reflux disease) Continue Protonix    Advance Care Planning:   Code Status: Prior   Consults: Orthopedic surgery  Family Communication: None at bedside  Severity of Illness: The appropriate patient status  for this patient is INPATIENT. Inpatient status is judged to be reasonable and necessary in order to provide the required intensity of service to ensure the patient's safety. The patient's presenting symptoms, physical exam findings, and initial radiographic and laboratory data in the context of their chronic comorbidities is felt to place them at high risk for further clinical deterioration. Furthermore, it is not anticipated that the patient will be medically stable for discharge from the hospital within 2 midnights of admission.   * I certify that at the point of admission it is my clinical judgment that the patient will require inpatient hospital care spanning beyond 2 midnights from the point of admission due to high intensity of service, high risk for further deterioration and high frequency of surveillance required.*  Author: Frankey Shown, DO 11/11/2021 3:02 AM  For on call review [email or web address].

## 2021-11-11 NOTE — Assessment & Plan Note (Signed)
QTc ?Avoid QT prolonging drugs ?Magnesium level will be checked ?Repeat EKG in the morning ? ?

## 2021-11-11 NOTE — Assessment & Plan Note (Signed)
Continue gemtesa with therapeutic interchange ?

## 2021-11-11 NOTE — Consult Note (Addendum)
Cardiology Consultation:   Patient ID: Diana Mata MRN: [id number]; DOB: 02-07-1931  Admit date: 11/10/2021 Date of Consult: 11/11/2021  PCP:  Celene Squibb, MD   [facility] Cardiologist:  Carlyle Dolly, MD        Patient Profile:   Diana Mata is a 88 y.o. female with a hx of paroxysmal atrial fibrillation/flutter (on Xarelto), chronic LBBB, HTN, COPD and Stage 3 CKD who is being seen 11/11/2021 for the evaluation of preoperative cardiac clearance for left hip hemiarthroplasty at the request of Dr. Carles Collet.  History of Present Illness:   [name] was last examined by Dr. Harl Bowie in 03/2021 following a recent admission for atrial fibrillation with RVR and had spontaneously converted back to normal sinus rhythm with IV Cardizem. She was maintaining normal sinus rhythm at the time of her visit and was continued on short-acting Cardizem 30 mg twice daily and Xarelto for anticoagulation.   She presented to [facility] ED on 11/10/2021 reporting left hip pain after suffering a fall. EMS reported that she was in atrial fibrillation with RVR upon their arrival but rates were controlled while in the ED.   Initial labs showed WBC 13.8, Hgb 12.0, platelets 337, Na+ 134, K+ 3.8 and creatinine 1.04. Mg 2.0.  Negative for COVID and influenza. CXR showed no active disease. CT Head showed chronic small vessel ischemic changes but no acute intracranial abnormalities. Was noted to have a left subcapital femoral neck fracture. EKG showed a computer generated read of atrial flutter but appears most consistent with sinus rhythm with 1st degree AV block and artifact with known LBBB. Telemetry most consistent with sinus rhythm.   Orthopedic surgery was consulted and recommended left hip arthroplasty with plans for surgery on 11/13/2021 given her last dose of Xarelto was on 11/10/2021.  At the time of this encounter, she will awaken and answer questions but does fall back asleep.  Apologizes and we reviewed this is likely due to her pain medication. A&Ox3 at this time. She is unable to recall the specific events surrounding her fall. Says she lives by herself but family members check on her routinely. Performs ADL's independently and goes to the grocery store by herself.  Does not use a walker or cane at baseline. She denies any recent chest pain or palpitations. No recent orthopnea, PND or pitting edema. Her biggest concern at this time is her hip pain.    Past Medical History:  Diagnosis Date   Atrial flutter (HCC)    Chronic UTI    COPD (chronic obstructive pulmonary disease) (HCC)    Dizziness    GERD (gastroesophageal reflux disease)    IBS (irritable bowel syndrome)    Renal disorder    renal insufficiency    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     JOINT REPLACEMENT       Home Medications:  Prior to Admission medications   Medication Sig Start Date End Date Taking? Authorizing Provider  albuterol (PROVENTIL HFA;VENTOLIN HFA) 108 (90 Base) MCG/ACT inhaler Inhale 1-2 puffs into the lungs every 6 (six) hours as needed for wheezing or shortness of breath.   Yes [provider]  cyanocobalamin (,VITAMIN B-12,) 1000 MCG/ML injection Inject into the muscle. 11/08/21  Yes [provider]  diazepam (VALIUM) 5 MG tablet Take 5-10 mg by mouth every 6 (six) hours as needed for anxiety.   Yes [provider]  diltiazem (CARDIZEM)  30 MG tablet Take 1 tablet by mouth twice daily Patient taking differently: Take 30 mg by mouth 2 (two) times daily. 04/20/20  Yes Strader, [place], PA-C  escitalopram (LEXAPRO) 5 MG tablet Take 5 mg by mouth daily. 11/20/18  Yes [provider]  fluticasone (FLONASE) 50 MCG/ACT nasal spray Place 1 spray into both nostrils daily. 09/25/19  Yes [provider]  HYDROcodone-acetaminophen (NORCO/VICODIN) 5-325 MG tablet Take 1 tablet by  mouth every 4 (four) hours as needed for moderate pain.   Yes [provider]  loperamide (IMODIUM) 2 MG capsule Take 2 mg by mouth as needed for diarrhea or loose stools.   Yes [provider]  Multiple Vitamins-Minerals (PRESERVISION AREDS PO) Take 1 tablet by mouth in the morning and at bedtime.   Yes [provider]  nitrofurantoin (MACRODANTIN) 50 MG capsule TAKE ONE CAPSULE BY MOUTH EVERYDAY AT BEDTIME Patient taking differently: Take 50 mg by mouth at bedtime. 04/27/21  Yes McKenzie, Candee Furbish, MD  omeprazole (PRILOSEC) 20 MG capsule Take 20 mg by mouth daily.   Yes [provider]  ondansetron (ZOFRAN) 4 MG tablet Take 4 mg by mouth every 6 (six) hours as needed. for nausea 09/27/18  Yes [provider]  RESTASIS 0.05 % ophthalmic emulsion Place 1 drop into both eyes 2 (two) times daily. 09/16/18  Yes [provider]  Rivaroxaban (XARELTO) 15 MG TABS tablet Take 1 tablet (15 mg total) by mouth every morning. Patient taking differently: Take 15 mg by mouth every evening. 09/12/18  Yes Strader, Fransisco Hertz, PA-C  traZODone (DESYREL) 50 MG tablet Take 50 mg by mouth at bedtime.   Yes [provider]  Trospium Chloride 60 MG CP24 TAKE ONE CAPSULE BY MOUTH EVERYDAY AT BEDTIME Patient taking differently: Take 60 mg by mouth at bedtime. 11/03/21  Yes McKenzie, Candee Furbish, MD  Vibegron (GEMTESA) 75 MG TABS Take 1 capsule by mouth daily. 04/27/21  Yes McKenzie, Candee Furbish, MD    Inpatient Medications: Scheduled Meds:  diltiazem  30 mg Oral BID   feeding supplement  237 mL Oral BID BM   pantoprazole  40 mg Oral Daily   Continuous Infusions:  sodium chloride 75 mL/hr at 11/11/21 0348   [START ON 11/13/2021]  ceFAZolin (ANCEF) IV     [START ON 11/13/2021] tranexamic acid     PRN Meds: fentaNYL (SUBLIMAZE) injection, HYDROcodone-acetaminophen  Allergies:    Allergies  Allergen Reactions   Levaquin [Levofloxacin In D5w] Itching    Lower  extremities   Levofloxacin    Penicillins Nausea Only    Has patient had a PCN reaction causing immediate rash, facial/tongue/throat swelling, SOB or lightheadedness with hypotension: No Has patient had a PCN reaction causing severe rash involving mucus membranes or skin necrosis: No Has patient had a PCN reaction that required hospitalization: No Has patient had a PCN reaction occurring within the last 10 years: No If all of the above answers are "NO", then may proceed with Cephalosporin use.    Sulfa Antibiotics Hives   Tequin [Gatifloxacin] Itching    Lower extremities   Latex Itching and Rash    Social History:   Social History   Tobacco Use   Smoking status: Never   Smokeless tobacco: Never  Substance Use Topics   Alcohol use: Never    Comment: occ     Family History:    Family History  Problem Relation Age of Onset   Hypertension Mother  ROS:  Please see the history of present illness.  All other ROS reviewed and negative.     Physical Exam/Data:   Vitals:   11/11/21 0230 11/11/21 0312 11/11/21 0326 11/11/21 0500  BP:  (!) 149/58  (!) 101/47  Pulse:  92  82  Resp: 14 15  17   Temp:  98.2 F (36.8 C)  98.2 F (36.8 C)  TempSrc:  Oral  Oral  SpO2:  100%  100%  Weight:   54 kg   Height:        Intake/Output Summary (Last 24 hours) at 11/11/2021 1023 Last data filed at 11/11/2021 0400 Gross per 24 hour  Intake 245.51 ml  Output --  Net 245.51 ml   Last 3 Weights 11/11/2021 03/15/2021 02/04/2021  Weight (lbs) 119 lb 0.8 oz 123 lb 125 lb  Weight (kg) 54 kg 55.792 kg 56.7 kg     Body mass index is 20.43 kg/m.  General:  Pleasant elderly female appearing in no acute distress. HEENT: Ecchymosis noted.  Neck: no JVD Vascular: No carotid bruits; Distal pulses 2+ bilaterally Cardiac:  normal S1, S2; RRR; 2/6 SEM along RUSB.  Lungs:  clear to auscultation bilaterally, no wheezing, rhonchi or rales  Abd: soft, nontender, no hepatomegaly  Ext: no pitting  edema. Musculoskeletal:  No deformities. Skin: warm and dry  Neuro:  CNs 2-12 intact, no focal abnormalities noted Psych:  Normal affect   EKG:  The EKG was personally reviewed and demonstrates: Normal sinus rhythm with 1st degree AV block and artifact with known LBBB.  Telemetry:  Telemetry was personally reviewed and demonstrates: NSR, HR in 70's to 80's.   Relevant CV Studies:  Echocardiogram: 06/07/2018 Study Conclusions   - Left ventricle: The cavity size was normal. Wall thickness was    increased in a pattern of mild LVH. Systolic function was normal.    The estimated ejection fraction was in the range of 60% to 65%.    Wall motion was normal; there were no regional wall motion    abnormalities. Left ventricular diastolic function parameters    were normal.  - Aortic valve: Mildly calcified annulus. Trileaflet; normal    thickness leaflets. There was mild regurgitation. Valve area    (VTI): 2.38 cm^2. Valve area (Vmax): 2.1 cm^2.  - Mitral valve: There was mild regurgitation.  - Atrial septum: No defect or patent foramen ovale was identified.  - Pulmonary arteries: PA peak pressure: 36 mm Hg (S).  - Technically adequate study.   Laboratory Data:  High Sensitivity Troponin:  No results for input(s): TROPONINIHS in the last 720 hours.   Chemistry Recent Labs  Lab 11/10/21 2119 11/11/21 0445  NA 134* 135  K 3.8 3.8  CL 104 108  CO2 19* 19*  GLUCOSE 105* 99  BUN 23 21  CREATININE 1.04* 1.00  CALCIUM 8.8* 8.2*  MG  --  2.0  GFRNONAA 52* 54*  ANIONGAP 11 8    Recent Labs  Lab 11/10/21 2119 11/11/21 0445  PROT 7.3 5.8*  ALBUMIN 4.0 3.2*  AST 23 66*  ALT 20 43  ALKPHOS 77 78  BILITOT 0.5 0.5    Hematology Recent Labs  Lab 11/10/21 2119 11/11/21 0445  WBC 13.8* 11.4*  RBC 4.27 3.60*  HGB 12.0 9.6*  HCT 37.3 31.9*  MCV 87.4 88.6  MCH 28.1 26.7  MCHC 32.2 30.1  RDW 15.6* 15.5  PLT 337 263    Radiology/Studies:  DG Chest 1 View  Result Date:  11/10/2021 CLINICAL DATA:  Known left hip fracture, initial encounter EXAM: CHEST  1 VIEW COMPARISON:  02/04/2021 FINDINGS: Cardiac shadow is within normal limits. Aortic calcifications are seen. Lungs are well aerated bilaterally. No acute infiltrate is seen. No bony abnormality is noted. IMPRESSION: No active disease. Electronically Signed   By: Inez Catalina M.D.   On: 11/10/2021 23:23   CT Head Wo Contrast  Result Date: 11/10/2021 CLINICAL DATA:  Head trauma fall EXAM: CT HEAD WITHOUT CONTRAST TECHNIQUE: Contiguous axial images were obtained from the base of the skull through the vertex without intravenous contrast. RADIATION DOSE REDUCTION: This exam was performed according to the departmental dose-optimization program which includes automated exposure control, adjustment of the mA and/or kV according to patient size and/or use of iterative reconstruction technique. COMPARISON:  CT 06/30/2018, FINDINGS: Brain: No acute territorial infarction,hemorrhage or intracranial mass. Moderate atrophy. Moderate chronic small vessel ischemic changes of the white matter. Stable ventricle size. Vascular: No hyperdense vessels.  Carotid vascular calcification. Skull: No fracture. 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 Sinuses/Orbits: No acute finding. Other: None IMPRESSION: 1. No CT evidence for acute intracranial abnormality. 2. Atrophy and chronic small vessel ischemic changes of the white matter. Electronically Signed   By: Donavan Foil M.D.   On: 11/10/2021 23:28   DG Hip Unilat W or Wo Pelvis 2-3 Views Left  Result Date: 11/10/2021 CLINICAL DATA:  Recent fall with left hip pain, initial encounter EXAM: DG HIP (WITH OR WITHOUT PELVIS) 3V LEFT COMPARISON:  None. FINDINGS: Pelvic ring is intact. Subcapital femoral neck fracture is noted on the left with impaction and angulation at the fracture site. Right hip replacement is noted.  No soft tissue abnormality is seen. IMPRESSION: Left subcapital femoral neck fracture. Electronically Signed   By: Inez Catalina M.D.   On: 11/10/2021 23:23     Assessment and Plan:   1. Preoperative Cardiac Clearance for Left Hip Hemiarthroplasty - Presented with a fall and found to have a left subcapital femoral neck fracture. Cardiology consulted for preoperative cardiac clearance as it was felt that she was in atrial flutter upon admission but by review of her EKG and telemetry, she has been in normal sinus rhythm. Prior to her fall, she was active at baseline for her age and denies any recent anginal symptoms. No known history of CAD or CHF. She does have a murmur on examination and had a calcified annulus of her aortic valve by echocardiogram in 2019 and would recommend obtaining a follow-up echocardiogram as an outpatient but this does not need to be performed prior to surgery. Her RCRI risk is overall low at 0.4% risk of a major cardiac event. She does not require further cardiac testing at this time and would be of acceptable risk to proceed from a cardiac perspective.  2. Paroxysmal Atrial Fibrillation/Flutter - She has a history of this during prior admissions but is in normal sinus rhythm this admission. Continue to follow on telemetry. Will continue short-acting Cardizem 30 mg twice daily in the perioperative setting.  Xarelto is held for her upcoming surgery.  3. LBBB - Chronic by review of prior EKG's. No plans for further ischemic testing at this time.  4. HTN - Her BP was stable at 109/50 on most recent check. Continue short-acting Cardizem 30 mg twice daily.  5. Anemia - Hgb at 12.0 on admission, at 9.6 today. No reports of active bleeding. Continue to follow  in the post-operative setting.    For questions or updates, please contact [place] Please consult [email or web address] for contact info under    Signed, Erma Heritage, PA-C  11/11/2021 10:23 AM    Attending  note:  Patient seen and examined.  I reviewed her records and discussed the case with Ms. Delano Metz, I agree with her above findings.  Cardiology consulted for preoperative evaluation prior to left hip hemiarthroplasty over the weekend.  Patient has a history of paroxysmal to persistent atrial fibrillation managed with Cardizem and Xarelto as an outpatient.  She had a recent mechanical fall with resulting left hip fracture.  On examination she is somnolent due to pain medications.  Afebrile, heart rate in the 70s in sinus rhythm by telemetry which I personally reviewed.  Blood pressure 109/50.  Lungs are clear without labored breathing.  Cardiac exam with RRR and 2/6 basal systolic murmur.  Pertinent lab work includes potassium 3.8, BUN 21, creatinine 1.0, AST 66, ALT 43, hemoglobin 9.6, platelets 263.  I personally reviewed her ECG today which shows sinus rhythm with left bundle branch block.  Chest x-ray reports no acute process.  Her last echocardiogram was in 2019 at which point LVEF was 60 to 65% and she had a sclerotic aortic valve without stenosis, mild aortic regurgitation.  At baseline she indicates performance of all ADLs, no obvious angina symptoms, NYHA class II dyspnea, activities meeting 4 METS.  Patient should be able to proceed with left hip hemiarthroplasty under general anesthesia at acceptable cardiovascular risk.  RCRI perioperative cardiac risk calculator indicates low risk, class I, 0.4% chance of major adverse cardiac event.  Xarelto is on hold at this time.  Would ultimately update her echocardiogram, but this should not hold up surgery.  Satira Sark, M.D., F.A.C.C.

## 2021-11-11 NOTE — Progress Notes (Signed)
PROGRESS NOTE  Diana Mata [id number] DOB: 19-Oct-1930 DOA: 11/10/2021 PCP: Celene Squibb, MD  Brief History:  88 year old female with a history of paroxysmal atrial fibrillation, COPD, GERD, IBS, left bundle branch block, CKD stage III presenting with mechanical fall and left hip pain.  The patient was walking to her kitchen when she tripped and fell onto her left side.  She did not lose consciousness.  Ultimately, she was able to activate EMS.  At baseline, the patient is independent with all her ADLs and lives alone.  The patient had been in her usual state of health prior to her mechanical fall.  She denies fevers, chills, headache, chest pain, shortness of breath, coughing, hemoptysis, nausea, vomiting, diarrhea, abdominal pain, dysuria.   In the ED, the patient was afebrile hemodynamically stable with oxygen saturation 100% 2 L.  BMP showed sodium 134, potassium 3.8, bicarbonate 19, serum creatinine 1.04.  WBC 13.8, hemoglobin 12.0, platelets 237,000.  X-ray of the left hip showed a left subcapital femoral neck fracture.  Orthopedics was consulted and plans for operative repair on 11/13/2021.     Assessment and Plan: * Closed displaced fracture of left femoral neck (HCC) Left hip x-ray showed left subcapital femoral neck fracture Continue Norco 5-325mg  p.o. every 6 hours as needed for pain Continue fall precaution and neurochecks Consider PT/OT eval and treat status post surgical repair of femoral neck fracture Case discussed with orthopedic surgery, Dr. Amedeo Kinsman Last dose Xarelto on 11/10/21 at [place] fall precaution and neurochecks Consider PT/OT eval and treat status post surgical repair of femoral neck fracture  Leukocytosis Secondary to stress demargination  Paroxysmal atrial fibrillation (HCC) Currently in sinus rhythm Holding Xarelto in preparation for surgery--last dose on 11/10/2021 at 10 AM Continue home dose diltiazem  Chronic kidney disease,  stage 3a ([place]) Baseline creatinine 0.8-1.1  Overactive bladder Continue gemtesa with therapeutic interchange  GERD (gastroesophageal reflux disease) Continue Protonix         Status is: Inpatient Remains inpatient appropriate because: severity of illness requiring operative management    Family Communication:   no Family at bedside  Consultants:  ortho--[place]  Code Status:  DNR  DVT Prophylaxis:  Xarelto on hold   Procedures: As Listed in Progress Note Above  Antibiotics: None     Total time spent 50 minutes.  Greater than 50% spent face to face counseling and coordinating care.   Subjective: Patient complains of left hip pain.  Patient denies fevers, chills, headache, chest pain, dyspnea, nausea, vomiting, diarrhea, abdominal pain, dysuria, hematuria, hematochezia, and melena.   Objective: Vitals:   11/11/21 0230 11/11/21 0312 11/11/21 0326 11/11/21 0500  BP:  (!) 149/58  (!) 101/47  Pulse:  92  82  Resp: 14 15  17   Temp:  98.2 F (36.8 C)  98.2 F (36.8 C)  TempSrc:  Oral  Oral  SpO2:  100%  100%  Weight:   54 kg   Height:        Intake/Output Summary (Last 24 hours) at 11/11/2021 0824 Last data filed at 11/11/2021 0400 Gross per 24 hour  Intake 245.51 ml  Output --  Net 245.51 ml   Weight change:  Exam:  General:  Pt is alert, follows commands appropriately, not in acute distress HEENT: No icterus, No thrush, No neck mass, [place]/AT Cardiovascular: RRR, S1/S2, no rubs, no gallops Respiratory: bibasilar crackles. No wheeze Abdomen: Soft/+BS, non tender, non distended, no guarding  Extremities: No edema, No lymphangitis, No petechiae, No rashes, no synovitis   Data Reviewed: I have personally reviewed following labs and imaging studies Basic Metabolic Panel: Recent Labs  Lab 11/10/21 2119 11/11/21 0445  NA 134* 135  K 3.8 3.8  CL 104 108  CO2 19* 19*  GLUCOSE 105* 99  BUN 23 21  CREATININE 1.04* 1.00  CALCIUM 8.8* 8.2*  MG  --   2.0  PHOS  --  3.5   Liver Function Tests: Recent Labs  Lab 11/10/21 2119 11/11/21 0445  AST 23 66*  ALT 20 43  ALKPHOS 77 78  BILITOT 0.5 0.5  PROT 7.3 5.8*  ALBUMIN 4.0 3.2*   No results for input(s): LIPASE, AMYLASE in the last 168 hours. No results for input(s): AMMONIA in the last 168 hours. Coagulation Profile: No results for input(s): INR, PROTIME in the last 168 hours. CBC: Recent Labs  Lab 11/10/21 2119 11/11/21 0445  WBC 13.8* 11.4*  NEUTROABS 8.3*  --   HGB 12.0 9.6*  HCT 37.3 31.9*  MCV 87.4 88.6  PLT 337 263   Cardiac Enzymes: No results for input(s): CKTOTAL, CKMB, CKMBINDEX, TROPONINI in the last 168 hours. BNP: Invalid input(s): POCBNP CBG: No results for input(s): GLUCAP in the last 168 hours. HbA1C: No results for input(s): HGBA1C in the last 72 hours. Urine analysis:    Component Value Date/Time   COLORURINE YELLOW 11/27/2018 1535   APPEARANCEUR Hazy (A) 11/16/2020 1342   LABSPEC 1.017 11/27/2018 1535   PHURINE 5.0 11/27/2018 1535   GLUCOSEU Negative 11/16/2020 1342   HGBUR NEGATIVE 11/27/2018 1535   BILIRUBINUR Negative 11/16/2020 1342   [place] 11/27/2018 1535   PROTEINUR Negative 11/16/2020 1342   PROTEINUR 100 (A) 11/27/2018 1535   UROBILINOGEN negative (A) 09/24/2019 1629   NITRITE Positive (A) 11/16/2020 1342   NITRITE NEGATIVE 11/27/2018 1535   LEUKOCYTESUR 3+ (A) 11/16/2020 1342   LEUKOCYTESUR MODERATE (A) 11/27/2018 1535   Sepsis Labs: @LABRCNTIP (procalcitonin:4,lacticidven:4) ) Recent Results (from the past 240 hour(s))  Resp Panel by RT-PCR (Flu A&B, Covid) Nasopharyngeal Swab     Status: None   Collection Time: 11/10/21 11:19 PM   Specimen: Nasopharyngeal Swab; Nasopharyngeal(NP) swabs in vial transport medium  Result Value Ref Range Status   SARS Coronavirus 2 by RT PCR NEGATIVE NEGATIVE Final    Comment: (NOTE) SARS-CoV-2 target nucleic acids are NOT DETECTED.  The SARS-CoV-2 RNA is generally detectable  in upper respiratory specimens during the acute phase of infection. The lowest concentration of SARS-CoV-2 viral copies this assay can detect is 138 copies/mL. A negative result does not preclude SARS-Cov-2 infection and should not be used as the sole basis for treatment or other patient management decisions. A negative result may occur with  improper specimen collection/handling, submission of specimen other than nasopharyngeal swab, presence of viral mutation(s) within the areas targeted by this assay, and inadequate number of viral copies(<138 copies/mL). A negative result must be combined with clinical observations, patient history, and epidemiological information. The expected result is Negative.  Fact Sheet for Patients:  EntrepreneurPulse.com.au  Fact Sheet for Healthcare Providers:  IncredibleEmployment.be  This test is no t yet approved or cleared by the [place] FDA and  has been authorized for detection and/or diagnosis of SARS-CoV-2 by FDA under an Emergency Use Authorization (EUA). This EUA will remain  in effect (meaning this test can be used) for the duration of the COVID-19 declaration under Section 564(b)(1) of the Act, 21 U.S.C.section 360bbb-3(b)(1), unless  the authorization is terminated  or revoked sooner.       Influenza A by PCR NEGATIVE NEGATIVE Final   Influenza B by PCR NEGATIVE NEGATIVE Final    Comment: (NOTE) The Xpert Xpress SARS-CoV-2/FLU/RSV plus assay is intended as an aid in the diagnosis of influenza from Nasopharyngeal swab specimens and should not be used as a sole basis for treatment. Nasal washings and aspirates are unacceptable for Xpert Xpress SARS-CoV-2/FLU/RSV testing.  Fact Sheet for Patients: EntrepreneurPulse.com.au  Fact Sheet for Healthcare Providers: IncredibleEmployment.be  This test is not yet approved or cleared by the [place] FDA and has  been authorized for detection and/or diagnosis of SARS-CoV-2 by FDA under an Emergency Use Authorization (EUA). This EUA will remain in effect (meaning this test can be used) for the duration of the COVID-19 declaration under Section 564(b)(1) of the Act, 21 U.S.C. section 360bbb-3(b)(1), unless the authorization is terminated or revoked.  Performed at [facility], [address], [place], [place] [address]      Scheduled Meds:  diltiazem  30 mg Oral BID   feeding supplement  237 mL Oral BID BM   pantoprazole  40 mg Oral Daily   Continuous Infusions:  sodium chloride 75 mL/hr at 11/11/21 0348    Procedures/Studies: DG Chest 1 View  Result Date: 11/10/2021 CLINICAL DATA:  Known left hip fracture, initial encounter EXAM: CHEST  1 VIEW COMPARISON:  02/04/2021 FINDINGS: Cardiac shadow is within normal limits. Aortic calcifications are seen. Lungs are well aerated bilaterally. No acute infiltrate is seen. No bony abnormality is noted. IMPRESSION: No active disease. Electronically Signed   By: Inez Catalina M.D.   On: 11/10/2021 23:23   CT Head Wo Contrast  Result Date: 11/10/2021 CLINICAL DATA:  Head trauma fall EXAM: CT HEAD WITHOUT CONTRAST TECHNIQUE: Contiguous axial images were obtained from the base of the skull through the vertex without intravenous contrast. RADIATION DOSE REDUCTION: This exam was performed according to the departmental dose-optimization program which includes automated exposure control, adjustment of the mA and/or kV according to patient size and/or use of iterative reconstruction technique. COMPARISON:  CT 06/30/2018, FINDINGS: Brain: No acute territorial infarction,hemorrhage or intracranial mass. Moderate atrophy. Moderate chronic small vessel ischemic changes of the white matter. Stable ventricle size. Vascular: No hyperdense vessels.  Carotid vascular calcification. Skull: No fracture. Ground-glass density at the central skull base with mild expansion suggestive of  fibrous dysplasia. Left temporal craniectomy. Sinuses/Orbits: No acute finding. Other: None IMPRESSION: 1. No CT evidence for acute intracranial abnormality. 2. Atrophy and chronic small vessel ischemic changes of the white matter. Electronically Signed   By: Donavan Foil M.D.   On: 11/10/2021 23:28   DG Hip Unilat W or Wo Pelvis 2-3 Views Left  Result Date: 11/10/2021 CLINICAL DATA:  Recent fall with left hip pain, initial encounter EXAM: DG HIP (WITH OR WITHOUT PELVIS) 3V LEFT COMPARISON:  None. FINDINGS: Pelvic ring is intact. Subcapital femoral neck fracture is noted on the left with impaction and angulation at the fracture site. Right hip replacement is noted. No soft tissue abnormality is seen. IMPRESSION: Left subcapital femoral neck fracture. Electronically Signed   By: Inez Catalina M.D.   On: 11/10/2021 23:23    Orson Eva, DO  [facility]  If 7PM-7AM, please contact night-coverage [email or web address] Password [email or web address] 11/11/2021, 8:24 AM   LOS: 0 days

## 2021-11-11 NOTE — Assessment & Plan Note (Addendum)
Left hip x-ray showed left subcapital femoral neck fracture Appreciate ortho, Dr. Amedeo Kinsman Last dose Xarelto on 11/10/21 at 10AM 3/11--Left hemiarthroplasty Start PT/OT>>SNF D/c fentanyl IV Continue Norco 5/325>>increase to q 4 Restart Xarelto in am if Hgb stable

## 2021-11-11 NOTE — Progress Notes (Addendum)
Initial Nutrition Assessment ? ?DOCUMENTATION CODES:  ? ?  ? ?INTERVENTION:  ?Ensure Enlive BID  ? ?Liberalize diet: Regular due to poor meal intake and risk for malnutrition  ? ?NUTRITION DIAGNOSIS:  ? ?Inadequate oral intake related to hip fracture, decreased appetite as evidenced by meal completion < 50%. ? ? ?GOAL:  ?Patient will meet greater than or equal to 90% of their needs ? ? ?MONITOR:  ?PO intake, Supplement acceptance, Labs, Skin, Weight trends, I & O's ? ?REASON FOR ASSESSMENT:  ? ?Malnutrition Screening Tool ?  ? ?ASSESSMENT: Patient had a mechanical fall and fractured her left hip. Scheduled for surgery tomorrow.  ? ?PMH: IBS, Chronic UTI, COPD, Renal disorder.  ? ?Patient ate 25% of breakfast. Lunch is here but she has not started eating. Patient drowsy and will open her eyes only briefly. Able to feed herself at baseline.  ? ?Weight history stable between 54-57 kg the past 18 months.  ? ? Labs reviewed: Sodium / potassium , BUN /Creat. -wnl. Hemoglobin  9.4 (L).  ? ?IVF- NS @ 75 ml/hr.  ? ?NUTRITION - FOCUSED PHYSICAL EXAM: ?NFPE conducted findings are moderate orbital, mild thoracic fat depletion, moderate temporal, dorsal and mild clavicle muscle depletion and mild edema.  ? ? ?Diet Order:   ?Diet Order   ? ?       ?  Diet Heart Room service appropriate? Yes; Fluid consistency: Thin  Diet effective now       ?  ? ?  ?  ? ?  ? ? ?EDUCATION NEEDS:  ?Not appropriate for education at this time ? ?Skin:  Skin Assessment: Reviewed RN Assessment ? ?Last BM:  3/9 ? ?Height:  ? ?Ht Readings from Last 1 Encounters:  ?11/10/21 5\' 4"  (1.626 m)  ? ? ?Weight:  ? ?Wt Readings from Last 1 Encounters:  ?11/11/21 54 kg  ? ? ?Ideal Body Weight:   55 kg ? ?BMI:  Body mass index is 20.43 kg/m?. ? ?Estimated Nutritional Needs:  ? ?Kcal:  1600-1800 ? ?Protein:  70-75 gr ? ?Fluid:  1600 ml daily ? ? ?01/11/22 MS,RD,CSG,LDN ?Contact: AMION ?

## 2021-11-11 NOTE — Assessment & Plan Note (Addendum)
Baseline creatinine 0.8-1.1 stable

## 2021-11-11 NOTE — ED Notes (Signed)
Pt hearing aids (two) placed in biohazard bag and put in pt shoe in her belongings bag- Latina, RN receiving nurse on 300 was made aware of this ?

## 2021-11-11 NOTE — NC FL2 (Signed)
?[place] MEDICAID FL2 LEVEL OF CARE SCREENING TOOL  ?  ? ?IDENTIFICATION  ?Patient Name: ?Diana Mata Birthdate: [birth year] Sex: female Admission Date (Current Location): ?11/10/2021  ?[place] and [place] Number: ? [place] and Address:  ?[place] [address], [place] ?     Provider Number: ?[id number]  ?Attending Physician Name and Address:  ?Orson Eva, MD ? Relative Name and Phone Number:  ?  ?   ?Current Level of Care: ?Hospital Recommended Level of Care: ?[place] Prior Approval Number: ?  ? ?Date Approved/Denied: ?  PASRR Number: ?[id number] ? ?Discharge Plan: ?SNF ?  ? ?Current Diagnoses: ?Patient Active Problem List  ? Diagnosis Date Noted  ? Closed displaced fracture of left femoral neck ([place]) 11/11/2021  ? Leukocytosis 11/11/2021  ? Paroxysmal atrial fibrillation ([place]) 11/11/2021  ? Chronic kidney disease, stage 3a ([place]) 11/11/2021  ? Atrial fibrillation with RVR ([place]) 02/04/2021  ? COPD (chronic obstructive pulmonary disease) ([place])   ? IBS (irritable bowel syndrome)   ? Chronic UTI   ? Chronic cystitis with hematuria 09/24/2019  ? Overactive bladder 09/24/2019  ? Fall 06/06/2018  ? Hypotension 06/06/2018  ? UTI (urinary tract infection) 06/06/2018  ? Elevated troponin 06/06/2018  ? Degenerative disc disease, cervical 06/06/2018  ? Normocytic anemia 06/06/2018  ? AKI (acute kidney injury) ([place]) 06/06/2018  ? GERD (gastroesophageal reflux disease) 06/06/2018  ? Atrial flutter ([place]) 06/06/2018  ? Anxiety 06/06/2018  ? Dehydration   ? ? ?Orientation RESPIRATION BLADDER Height & Weight   ?  ?Self, Time, Situation, Place ?   Incontinent Weight: 119 lb 0.8 oz (54 kg) ?Height:  5\' 4"  (162.6 cm)  ?BEHAVIORAL SYMPTOMS/MOOD NEUROLOGICAL BOWEL NUTRITION STATUS  ?    Continent Diet (see dc summary)  ?AMBULATORY STATUS COMMUNICATION OF NEEDS Skin   ?Extensive Assist Verbally Surgical wounds ?  ?  ?  ?    ?     ?     ? ? ?Personal Care Assistance  Level of Assistance  ?Bathing, Feeding, Dressing Bathing Assistance: Limited assistance ?Feeding assistance: Independent ?Dressing Assistance: Limited assistance ?   ? ?Functional Limitations Info  ?Sight, Hearing, Speech Sight Info: Adequate ?Hearing Info: Impaired ?Speech Info: Adequate  ? ? ?SPECIAL CARE FACTORS FREQUENCY  ?PT (By licensed PT), OT (By licensed OT)   ?  ?PT Frequency: 5x week ?OT Frequency: 3x week ?  ?  ?  ?   ? ? ?Contractures Contractures Info: Not present  ? ? ?Additional Factors Info  ?Code Status, Allergies Code Status Info: DNR ?Allergies Info: Levaquin, Levofloxacin, Penicillins, Sulfa Antibiotics, Tequin, Latex ?  ?  ?  ?   ? ?Current Medications (11/11/2021):  This is the current hospital active medication list ?Current Facility-Administered Medications  ?Medication Dose Route Frequency Provider Last Rate Last Admin  ? 0.9 %  sodium chloride infusion   Intravenous Continuous Adefeso, Oladapo, DO 75 mL/hr at 11/11/21 0348 New Bag at 11/11/21 0348  ? [START ON 11/13/2021] ceFAZolin (ANCEF) IVPB 2g/100 mL premix  2 g Intravenous On Call to [place], MD      ? diltiazem (CARDIZEM) tablet 30 mg  30 mg Oral BID Adefeso, Oladapo, DO   30 mg at 11/11/21 0912  ? feeding supplement (ENSURE ENLIVE / ENSURE PLUS) liquid 237 mL  237 mL Oral BID BM Adefeso, Oladapo, DO   237 mL at 11/11/21 0921  ? fentaNYL (SUBLIMAZE) injection 25 mcg  25 mcg  Intravenous Q2H PRN Orson Eva, MD   25 mcg at 11/11/21 [id number]  ? HYDROcodone-acetaminophen (NORCO/VICODIN) 5-325 MG per tablet 1 tablet  1 tablet Oral Q6H PRN Adefeso, Oladapo, DO   1 tablet at 11/11/21 0345  ? pantoprazole (PROTONIX) EC tablet 40 mg  40 mg Oral Daily Adefeso, Oladapo, DO   40 mg at 11/11/21 0913  ? [START ON 11/13/2021] tranexamic acid (CYKLOKAPRON) IVPB 1,000 mg  1,000 mg Intravenous To OR Mordecai Rasmussen, MD      ? ? ? ?Discharge Medications: ?Please see discharge summary for a list of discharge medications. ? ?Relevant Imaging  Results: ? ?Relevant Lab Results: ? ? ?Additional Information ?SSN: [id number] ? ?Shade Flood, LCSW ? ? ? ? ?

## 2021-11-11 NOTE — Consult Note (Addendum)
ORTHOPAEDIC CONSULTATION  REQUESTING PHYSICIAN: Tat, Shanon Brow, MD  ASSESSMENT AND PLAN: 88 y.o. female with the following: Left Hip Displaced femoral neck fracture  This patient requires inpatient admission to the hospitalist, to include preoperative clearance and perioperative medical management  - Weight Bearing Status/Activity: NWB Left lower extremity  - Additional recommended labs/tests: Preop Labs: CBC, BMP, PT/INR, Chest XR, and EKG  -VTE Prophylaxis: Please hold prior to OR; to resume POD#1 at the discretion of the primary team  - Pain control: Recommend PO pain medications PRN; judicious use of narcotics  - Follow-up plan: F/u 10-14 days postop  -Procedures: Plan for OR once patient has been medically optimized  Plan for Left Hip hemiarthroplasty   Patient taking Xarelto for A-fib/flutter, last dose 3/9. Surgery will be delayed until 3/12.  Please keep patient NPO evening prior to surgery.  Will ensure patient is medically stable prior to surgery.  She may require cardiac clearance before surgery.    Addendum - patient's daughter stated that the last dose of Xarelto was 3/8, in the evening, not the am of 3/9.  As a result, we can proceed with surgery the morning of 3/11.  Case has been confirmed.  Please keep the patient NPO after midnight the night before surgery.   Chief Complaint: Left hip pain  HPI: Diana Mata is a 88 y.o. female who presented to the ED for evaluation after sustaining a mechanical fall.  She is unsure what caused her to fall.  She did hit her head.  She has some bruising on her chin.  She is complaining of left hip pain.  She lives at home, alone.  She does not use a walker all of the time.  Pain with any movement.  No numbness or tingling.      Past Medical History:  Diagnosis Date   Atrial flutter (HCC)    Chronic UTI    COPD (chronic obstructive pulmonary disease) (HCC)    Dizziness    GERD (gastroesophageal reflux disease)    IBS  (irritable bowel syndrome)    Renal disorder    renal insufficiency   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     JOINT REPLACEMENT     Social History   Socioeconomic History   Marital status: Widowed    Spouse name: Not on file   Number of children: 3   Years of education: Not on file   Highest education level: Bachelor's degree (e.g., BA, AB, BS)  Occupational History   Occupation: Retired  Tobacco Use   Smoking status: Never   Smokeless tobacco: Never  Vaping Use   Vaping Use: Never used  Substance and Sexual Activity   Alcohol use: Never    Comment: occ   Drug use: Never   Sexual activity: Not on file  Other Topics Concern   Not on file  Social History Narrative   Patient moved to [place] from [place] in August 2019, to be near family   02/03/19 lives alone , 1 mile from daughter, Claiborne Billings   Social Determinants of Health   Financial Resource Strain: Not on [employer] Insecurity: Not on file  Transportation Needs: Not on file  Physical Activity: Not on file  Stress: Not on file  Social Connections: Not on file   Family History  Problem Relation Age of Onset   Hypertension Mother    Allergies  Allergen Reactions  Levaquin [Levofloxacin In D5w] Itching    Lower extremities   Levofloxacin    Penicillins Nausea Only    Has patient had a PCN reaction causing immediate rash, facial/tongue/throat swelling, SOB or lightheadedness with hypotension: No Has patient had a PCN reaction causing severe rash involving mucus membranes or skin necrosis: No Has patient had a PCN reaction that required hospitalization: No Has patient had a PCN reaction occurring within the last 10 years: No If all of the above answers are "NO", then may proceed with Cephalosporin use.    Sulfa Antibiotics Hives   Tequin [Gatifloxacin] Itching    Lower extremities   Latex Itching and Rash   Prior to Admission  medications   Medication Sig Start Date End Date Taking? Authorizing Provider  albuterol (PROVENTIL HFA;VENTOLIN HFA) 108 (90 Base) MCG/ACT inhaler Inhale 1-2 puffs into the lungs every 6 (six) hours as needed for wheezing or shortness of breath.   Yes [provider]  cyanocobalamin (,VITAMIN B-12,) 1000 MCG/ML injection Inject into the muscle. 11/08/21  Yes [provider]  diazepam (VALIUM) 5 MG tablet Take 5-10 mg by mouth every 6 (six) hours as needed for anxiety.   Yes [provider]  diltiazem (CARDIZEM) 30 MG tablet Take 1 tablet by mouth twice daily Patient taking differently: Take 30 mg by mouth 2 (two) times daily. 04/20/20  Yes Strader, [place], PA-C  escitalopram (LEXAPRO) 5 MG tablet Take 5 mg by mouth daily. 11/20/18  Yes [provider]  fluticasone (FLONASE) 50 MCG/ACT nasal spray Place 1 spray into both nostrils daily. 09/25/19  Yes [provider]  HYDROcodone-acetaminophen (NORCO/VICODIN) 5-325 MG tablet Take 1 tablet by mouth every 4 (four) hours as needed for moderate pain.   Yes [provider]  loperamide (IMODIUM) 2 MG capsule Take 2 mg by mouth as needed for diarrhea or loose stools.   Yes [provider]  Multiple Vitamins-Minerals (PRESERVISION AREDS PO) Take 1 tablet by mouth in the morning and at bedtime.   Yes [provider]  nitrofurantoin (MACRODANTIN) 50 MG capsule TAKE ONE CAPSULE BY MOUTH EVERYDAY AT BEDTIME Patient taking differently: Take 50 mg by mouth at bedtime. 04/27/21  Yes McKenzie, Candee Furbish, MD  omeprazole (PRILOSEC) 20 MG capsule Take 20 mg by mouth daily.   Yes [provider]  ondansetron (ZOFRAN) 4 MG tablet Take 4 mg by mouth every 6 (six) hours as needed. for nausea 09/27/18  Yes [provider]  RESTASIS 0.05 % ophthalmic emulsion Place 1 drop into both eyes 2 (two) times daily. 09/16/18  Yes [provider]  Rivaroxaban (XARELTO) 15 MG TABS tablet Take  1 tablet (15 mg total) by mouth every morning. Patient taking differently: Take 15 mg by mouth every evening. 09/12/18  Yes Strader, Fransisco Hertz, PA-C  traZODone (DESYREL) 50 MG tablet Take 50 mg by mouth at bedtime.   Yes [provider]  Trospium Chloride 60 MG CP24 TAKE ONE CAPSULE BY MOUTH EVERYDAY AT BEDTIME Patient taking differently: Take 60 mg by mouth at bedtime. 11/03/21  Yes McKenzie, Candee Furbish, MD  Vibegron (GEMTESA) 75 MG TABS Take 1 capsule by mouth daily. 04/27/21  Yes McKenzie, Candee Furbish, MD   DG Chest 1 View  Result Date: 11/10/2021 CLINICAL DATA:  Known left hip fracture, initial encounter EXAM: CHEST  1 VIEW COMPARISON:  02/04/2021 FINDINGS: Cardiac shadow is within normal limits. Aortic calcifications are seen. Lungs are well aerated bilaterally. No acute infiltrate is seen. No  bony abnormality is noted. IMPRESSION: No active disease. Electronically Signed   By: Inez Catalina M.D.   On: 11/10/2021 23:23   CT Head Wo Contrast  Result Date: 11/10/2021 CLINICAL DATA:  Head trauma fall EXAM: CT HEAD WITHOUT CONTRAST TECHNIQUE: Contiguous axial images were obtained from the base of the skull through the vertex without intravenous contrast. RADIATION DOSE REDUCTION: This exam was performed according to the departmental dose-optimization program which includes automated exposure control, adjustment of the mA and/or kV according to patient size and/or use of iterative reconstruction technique. COMPARISON:  CT 06/30/2018, FINDINGS: Brain: No acute territorial infarction,hemorrhage or intracranial mass. Moderate atrophy. Moderate chronic small vessel ischemic changes of the white matter. Stable ventricle size. Vascular: No hyperdense vessels.  Carotid vascular calcification. Skull: No fracture. 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 Sinuses/Orbits: No acute finding. Other: None IMPRESSION: 1. No CT evidence  for acute intracranial abnormality. 2. Atrophy and chronic small vessel ischemic changes of the white matter. Electronically Signed   By: Donavan Foil M.D.   On: 11/10/2021 23:28   DG Hip Unilat W or Wo Pelvis 2-3 Views Left  Result Date: 11/10/2021 CLINICAL DATA:  Recent fall with left hip pain, initial encounter EXAM: DG HIP (WITH OR WITHOUT PELVIS) 3V LEFT COMPARISON:  None. FINDINGS: Pelvic ring is intact. Subcapital femoral neck fracture is noted on the left with impaction and angulation at the fracture site. Right hip replacement is noted. No soft tissue abnormality is seen. IMPRESSION: Left subcapital femoral neck fracture. Electronically Signed   By: Inez Catalina M.D.   On: 11/10/2021 23:23     Family History Reviewed and non-contributory, no pertinent history of problems with bleeding or anesthesia    Review of Systems No fevers or chills No numbness or tingling No chest pain No shortness of breath No bowel or bladder dysfunction No GI distress No headaches No loss of consciousness No hallucinations    OBJECTIVE  Vitals:Patient Vitals for the past 8 hrs:  BP Temp Temp src Pulse Resp SpO2 Weight  11/11/21 0500 (!) 101/47 98.2 F (36.8 C) Oral 82 17 100 % --  11/11/21 0326 -- -- -- -- -- -- 54 kg  11/11/21 0312 (!) 149/58 98.2 F (36.8 C) Oral 92 15 100 % --  11/11/21 0230 -- -- -- -- 14 -- --  11/11/21 0200 (!) 116/45 -- -- 78 18 96 % --  11/11/21 0130 (!) 129/47 -- -- 85 14 93 % --  11/11/21 0100 (!) 108/48 -- -- 80 18 95 % --   General: Alert, no acute distress Cardiovascular: Extremities are warm Respiratory: No cyanosis, no use of accessory musculature Skin: No lesions in the area of chief complaint  Neurologic: Sensation intact distally  Psychiatric: Patient is competent for consent with normal mood and affect Lymphatic: No swelling obvious and reported other than the area involved in the exam below Extremities  LLE: Extremity held in a fixed position.  ROM  deferred due to known fracture.  Sensation is intact distally in the sural, saphenous, DP, SP, and plantar nerve distribution. 2+ DP pulse.  Toes are WWP.  Active motion intact in the TA/EHL/GS. RLE: Sensation is intact distally in the sural, saphenous, DP, SP, and plantar nerve distribution. 2+ DP pulse.  Toes are WWP.  Active motion intact in the TA/EHL/GS. Tolerates gentle ROM of the hip.  No pain with axial loading.     Test  Results Imaging XR of the Left hip demonstrates a Displaced femoral neck fracture.  Labs cbc Recent Labs    11/10/21 2119 11/11/21 0445  WBC 13.8* 11.4*  HGB 12.0 9.6*  HCT 37.3 31.9*  PLT 337 263    Labs inflam No results for input(s): CRP in the last 72 hours.  Invalid input(s): ESR  Labs coag No results for input(s): INR, PTT in the last 72 hours.  Invalid input(s): PT  Recent Labs    11/10/21 2119 11/11/21 0445  NA 134* 135  K 3.8 3.8  CL 104 108  CO2 19* 19*  GLUCOSE 105* 99  BUN 23 21  CREATININE 1.04* 1.00  CALCIUM 8.8* 8.2*

## 2021-11-11 NOTE — Assessment & Plan Note (Addendum)
Secondary to stress demargination --no fever; hemodynamically stable -- Resolved

## 2021-11-11 NOTE — Progress Notes (Signed)
Tele called and had converted to afib.  BP  148/114 and pulse up to 160's .  Contacted Dr. Arbutus Leas and he ordered metoprolol.  Consent signed by daughter for surgery tomorrow.  MRSA and urine collected ?

## 2021-11-11 NOTE — Hospital Course (Addendum)
88 year old female with a history of paroxysmal atrial fibrillation, COPD, GERD, IBS, left bundle branch block, CKD stage III presenting with mechanical fall and left hip pain.  The patient was walking to her kitchen when she tripped and fell onto her left side.  She did not lose consciousness.  Ultimately, she was able to activate EMS.  At baseline, the patient is independent with all her ADLs and lives alone.  The patient had been in her usual state of health prior to her mechanical fall.  She denies fevers, chills, headache, chest pain, shortness of breath, coughing, hemoptysis, nausea, vomiting, diarrhea, abdominal pain, dysuria.   In the ED, the patient was afebrile hemodynamically stable with oxygen saturation 100% 2 L.  BMP showed sodium 134, potassium 3.8, bicarbonate 19, serum creatinine 1.04.  WBC 13.8, hemoglobin 12.0, platelets 237,000.  X-ray of the left hip showed a left subcapital femoral neck fracture.  Orthopedics was consulted and plans for operative repair on 11/13/2021. During evening 3/10-3/11, pt had paroxysms of PAF with RVR and given one dose IV lopressor.   Pt was asymptomatic.  Back in sinus rhythm at time of my eval 11/12/21 am.  11/13/21--continued to have paroxysms of Afib RVR;  increased cardizem to 30 mg q6 hr.  Poor po intake.  A little confused this morning.  States pain is controlled.  Denies cp, sob, n/v/d.

## 2021-11-11 NOTE — TOC Initial Note (Signed)
Transition of Care (TOC) - Initial/Assessment Note  ? ? ?Patient Details  ?Name: Diana Mata ?MRN: [id number] ?Date of Birth: [birth year] ? ?Transition of Care (TOC) CM/SW Contact:    ?Elliot Gault, LCSW ?Phone Number: ?11/11/2021, 11:25 AM ? ?Clinical Narrative:                 ? ?Pt admitted from home with a hip fracture. Surgery scheduled for tomorrow. Pt not alert due to pain medications. Spoke with pt's daughter who anticipates pt will need SNF rehab at dc. Updated daughter on CMS provider options and in-network contracts. Will refer as requested.  ? ?TOC will follow. ? ?Expected Discharge Plan: Skilled Nursing Facility ?Barriers to Discharge: Continued Medical Work up ? ? ?Patient Goals and CMS Choice ?Patient states their goals for this hospitalization and ongoing recovery are:: rehab ?CMS Medicare.gov Compare Post Acute Care list provided to:: Patient Represenative (must comment) ?Choice offered to / list presented to : Adult Children ? ?Expected Discharge Plan and Services ?Expected Discharge Plan: Skilled Nursing Facility ?In-house Referral: Clinical Social Work ?  ?Post Acute Care Choice: Skilled Nursing Facility ?Living arrangements for the past 2 months: Single Family Home ?                ?  ?  ?  ?  ?  ?  ?  ?  ?  ?  ? ?Prior Living Arrangements/Services ?Living arrangements for the past 2 months: Single Family Home ?Lives with:: Self ?Patient language and need for interpreter reviewed:: Yes ?Do you feel safe going back to the place where you live?: Yes      ?Need for Family Participation in Patient Care: No (Comment) ?Care giver support system in place?: No (comment) ?Current home services: DME ?Criminal Activity/Legal Involvement Pertinent to Current Situation/Hospitalization: No - Comment as needed ? ?Activities of Daily Living ?Home Assistive Devices/Equipment: None ?ADL Screening (condition at time of admission) ?Patient's cognitive ability adequate to safely complete daily activities?:  Yes ?Is the patient deaf or have difficulty hearing?: Yes ?Does the patient have difficulty seeing, even when wearing glasses/contacts?: No ?Does the patient have difficulty concentrating, remembering, or making decisions?: No ?Patient able to express need for assistance with ADLs?: Yes ?Does the patient have difficulty dressing or bathing?: Yes ?Independently performs ADLs?: No ?Communication: Independent ?Dressing (OT): Needs assistance ?Is this a change from baseline?: Change from baseline, expected to last >3 days ?Grooming: Needs assistance ?Is this a change from baseline?: Change from baseline, expected to last >3 days ?Feeding: Independent ?Bathing: Needs assistance ?Is this a change from baseline?: Change from baseline, expected to last >3 days ?Toileting: Needs assistance ?Is this a change from baseline?: Change from baseline, expected to last >3days ?In/Out Bed: Needs assistance ?Is this a change from baseline?: Change from baseline, expected to last >3 days ?Walks in Home: Independent ?Does the patient have difficulty walking or climbing stairs?: Yes ?Weakness of Legs: Both ?Weakness of Arms/Hands: None ? ?Permission Sought/Granted ?Permission sought to share information with : Magazine features editor ?Permission granted to share information with : Yes, Verbal Permission Granted ?   ? Permission granted to share info w AGENCY: sn[facility] ?   ?   ? ?Emotional Assessment ?  ?  ?  ?Orientation: : Oriented to Self, Oriented to Situation ?Alcohol / Substance Use: Not Applicable ?Psych Involvement: No (comment) ? ?Admission diagnosis:  Fall [W19.XXXA] ?Closed fracture of left hip, initial encounter (HCC) [S72.002A] ?Injury of head, initial encounter [S09.90XA] ?Fall, initial encounter [  W19.XXXA] ?Closed displaced fracture of left femoral neck (HCC) [S72.002A] ?Patient Active Problem List  ? Diagnosis Date Noted  ? Closed displaced fracture of left femoral neck (HCC) 11/11/2021  ? Leukocytosis 11/11/2021  ?  Paroxysmal atrial fibrillation (HCC) 11/11/2021  ? Chronic kidney disease, stage 3a (HCC) 11/11/2021  ? Atrial fibrillation with RVR (HCC) 02/04/2021  ? COPD (chronic obstructive pulmonary disease) (HCC)   ? IBS (irritable bowel syndrome)   ? Chronic UTI   ? Chronic cystitis with hematuria 09/24/2019  ? Overactive bladder 09/24/2019  ? Fall 06/06/2018  ? Hypotension 06/06/2018  ? UTI (urinary tract infection) 06/06/2018  ? Elevated troponin 06/06/2018  ? Degenerative disc disease, cervical 06/06/2018  ? Normocytic anemia 06/06/2018  ? AKI (acute kidney injury) (HCC) 06/06/2018  ? GERD (gastroesophageal reflux disease) 06/06/2018  ? Atrial flutter (HCC) 06/06/2018  ? Anxiety 06/06/2018  ? Dehydration   ? ?PCP:  Benita Stabile, MD ?Pharmacy:   ?[facility] - [place], [place] - [address] ?[address] ?[place] [address] ?Phone: [phone] Fax: [phone] ? ? ? ? ?Social Determinants of Health (SDOH) Interventions ?  ? ?Readmission Risk Interventions ?No flowsheet data found. ? ? ?

## 2021-11-11 NOTE — Assessment & Plan Note (Signed)
Continue Protonix °

## 2021-11-11 NOTE — Assessment & Plan Note (Signed)
Continue home Cardizem ?Xarelto will be held in the hospital for possible surgical repair of femoral neck fracture ?

## 2021-11-12 ENCOUNTER — Other Ambulatory Visit: Payer: Self-pay

## 2021-11-12 ENCOUNTER — Inpatient Hospital Stay (HOSPITAL_COMMUNITY): Payer: No Typology Code available for payment source | Admitting: Anesthesiology

## 2021-11-12 ENCOUNTER — Inpatient Hospital Stay (HOSPITAL_COMMUNITY): Payer: No Typology Code available for payment source

## 2021-11-12 ENCOUNTER — Encounter (HOSPITAL_COMMUNITY)
Admission: EM | Disposition: A | Discharge status: Skilled Nursing Facility | Payer: Self-pay | Source: Home / Self Care | Attending: Internal Medicine

## 2021-11-12 DIAGNOSIS — S0083XA Contusion of other part of head, initial encounter: Secondary | ICD-10-CM | POA: Diagnosis not present

## 2021-11-12 DIAGNOSIS — Z20822 Contact with and (suspected) exposure to covid-19: Secondary | ICD-10-CM | POA: Diagnosis not present

## 2021-11-12 DIAGNOSIS — Z96642 Presence of left artificial hip joint: Secondary | ICD-10-CM | POA: Diagnosis not present

## 2021-11-12 DIAGNOSIS — E871 Hypo-osmolality and hyponatremia: Secondary | ICD-10-CM | POA: Diagnosis not present

## 2021-11-12 DIAGNOSIS — N1831 Chronic kidney disease, stage 3a: Secondary | ICD-10-CM

## 2021-11-12 DIAGNOSIS — I480 Paroxysmal atrial fibrillation: Secondary | ICD-10-CM | POA: Diagnosis not present

## 2021-11-12 DIAGNOSIS — I129 Hypertensive chronic kidney disease with stage 1 through stage 4 chronic kidney disease, or unspecified chronic kidney disease: Secondary | ICD-10-CM | POA: Diagnosis not present

## 2021-11-12 DIAGNOSIS — N3281 Overactive bladder: Secondary | ICD-10-CM | POA: Diagnosis not present

## 2021-11-12 DIAGNOSIS — Z471 Aftercare following joint replacement surgery: Secondary | ICD-10-CM | POA: Diagnosis not present

## 2021-11-12 DIAGNOSIS — S72012A Unspecified intracapsular fracture of left femur, initial encounter for closed fracture: Secondary | ICD-10-CM | POA: Diagnosis not present

## 2021-11-12 DIAGNOSIS — I351 Nonrheumatic aortic (valve) insufficiency: Secondary | ICD-10-CM | POA: Diagnosis not present

## 2021-11-12 DIAGNOSIS — Z66 Do not resuscitate: Secondary | ICD-10-CM | POA: Diagnosis not present

## 2021-11-12 DIAGNOSIS — I4819 Other persistent atrial fibrillation: Secondary | ICD-10-CM | POA: Diagnosis not present

## 2021-11-12 DIAGNOSIS — S72002A Fracture of unspecified part of neck of left femur, initial encounter for closed fracture: Secondary | ICD-10-CM | POA: Diagnosis not present

## 2021-11-12 DIAGNOSIS — D72829 Elevated white blood cell count, unspecified: Secondary | ICD-10-CM | POA: Diagnosis not present

## 2021-11-12 DIAGNOSIS — D62 Acute posthemorrhagic anemia: Secondary | ICD-10-CM | POA: Diagnosis not present

## 2021-11-12 DIAGNOSIS — J449 Chronic obstructive pulmonary disease, unspecified: Secondary | ICD-10-CM | POA: Diagnosis not present

## 2021-11-12 LAB — HEMOGLOBIN AND HEMATOCRIT, BLOOD
HCT: 29.3 % — ABNORMAL LOW (ref 36.0–46.0)
Hemoglobin: 9.1 g/dL — ABNORMAL LOW (ref 12.0–15.0)

## 2021-11-12 LAB — ABO/RH: ABO/RH(D): A POS

## 2021-11-12 SURGERY — HEMIARTHROPLASTY, HIP, DIRECT ANTERIOR APPROACH, FOR FRACTURE
Anesthesia: General | Site: Hip | Laterality: Left

## 2021-11-12 MED ORDER — SUGAMMADEX SODIUM 200 MG/2ML IV SOLN
INTRAVENOUS | Status: DC | PRN
  Administered 2021-11-12: 200 mg via INTRAVENOUS

## 2021-11-12 MED ORDER — FENTANYL CITRATE (PF) 100 MCG/2ML IJ SOLN
INTRAMUSCULAR | Status: DC | PRN
  Administered 2021-11-12: 100 ug via INTRAVENOUS
  Administered 2021-11-12 (×3): 50 ug via INTRAVENOUS

## 2021-11-12 MED ORDER — VANCOMYCIN HCL 1000 MG IV SOLR
INTRAVENOUS | Status: AC
  Filled 2021-11-12: qty 20

## 2021-11-12 MED ORDER — FENTANYL CITRATE PF 50 MCG/ML IJ SOSY: 25.0000 ug | PREFILLED_SYRINGE | INTRAMUSCULAR | Status: DC | PRN

## 2021-11-12 MED ORDER — ONDANSETRON HCL 4 MG/2ML IJ SOLN: 4.0000 mg | Freq: Once | INTRAMUSCULAR | Status: DC | PRN

## 2021-11-12 MED ORDER — 0.9 % SODIUM CHLORIDE (POUR BTL) OPTIME
TOPICAL | Status: DC | PRN
  Administered 2021-11-12: 1000 mL

## 2021-11-12 MED ORDER — FENTANYL CITRATE (PF) 250 MCG/5ML IJ SOLN
INTRAMUSCULAR | Status: AC
  Filled 2021-11-12: qty 5

## 2021-11-12 MED ORDER — LACTATED RINGERS IV SOLN
INTRAVENOUS | Status: DC | PRN
Start: 2021-11-12 — End: 2021-11-12

## 2021-11-12 MED ORDER — ORAL CARE MOUTH RINSE: 15.0000 mL | Freq: Once | OROMUCOSAL | Status: DC

## 2021-11-12 MED ORDER — METOPROLOL TARTRATE 5 MG/5ML IV SOLN
2.5000 mg | Freq: Once | INTRAVENOUS | Status: AC
  Administered 2021-11-12: 2.5 mg via INTRAVENOUS
  Filled 2021-11-12: qty 5

## 2021-11-12 MED ORDER — CEFAZOLIN SODIUM-DEXTROSE 2-4 GM/100ML-% IV SOLN
2.0000 g | Freq: Three times a day (TID) | INTRAVENOUS | Status: AC
Start: 2021-11-12 — End: 2021-11-13
  Administered 2021-11-12 – 2021-11-13 (×3): 2 g via INTRAVENOUS
  Filled 2021-11-12 (×3): qty 100

## 2021-11-12 MED ORDER — SODIUM CHLORIDE 0.9 % IR SOLN
Status: DC | PRN
  Administered 2021-11-12: 3000 mL

## 2021-11-12 MED ORDER — ROCURONIUM BROMIDE 100 MG/10ML IV SOLN
INTRAVENOUS | Status: DC | PRN
  Administered 2021-11-12: 50 mg via INTRAVENOUS
  Administered 2021-11-12: 10 mg via INTRAVENOUS

## 2021-11-12 MED ORDER — BUPIVACAINE-EPINEPHRINE (PF) 0.5% -1:200000 IJ SOLN
INTRAMUSCULAR | Status: AC
Start: 2021-11-12 — End: ?
  Filled 2021-11-12: qty 30

## 2021-11-12 MED ORDER — STERILE WATER FOR IRRIGATION IR SOLN
Status: DC | PRN
  Administered 2021-11-12 (×2): 1000 mL

## 2021-11-12 MED ORDER — VANCOMYCIN HCL 1 G IV SOLR
INTRAVENOUS | Status: DC | PRN
  Administered 2021-11-12: 1000 mg

## 2021-11-12 MED ORDER — CHLORHEXIDINE GLUCONATE 0.12 % MT SOLN: 15.0000 mL | Freq: Once | OROMUCOSAL | Status: DC

## 2021-11-12 MED ORDER — SODIUM CHLORIDE FLUSH 0.9 % IV SOLN
INTRAVENOUS | Status: AC
Start: 2021-11-12 — End: ?
  Filled 2021-11-12: qty 10

## 2021-11-12 MED ORDER — ONDANSETRON HCL 4 MG/2ML IJ SOLN
INTRAMUSCULAR | Status: DC | PRN
  Administered 2021-11-12: 4 mg via INTRAVENOUS

## 2021-11-12 MED ORDER — LACTATED RINGERS IV SOLN: INTRAVENOUS | Status: DC

## 2021-11-12 MED ORDER — PROPOFOL 10 MG/ML IV BOLUS
INTRAVENOUS | Status: DC | PRN
  Administered 2021-11-12: 80 mg via INTRAVENOUS

## 2021-11-12 MED ORDER — BUPIVACAINE-EPINEPHRINE (PF) 0.5% -1:200000 IJ SOLN
INTRAMUSCULAR | Status: DC | PRN
  Administered 2021-11-12: 30 mL via PERINEURAL

## 2021-11-12 MED ORDER — PROPOFOL 10 MG/ML IV BOLUS
INTRAVENOUS | Status: AC
  Filled 2021-11-12: qty 20

## 2021-11-12 MED ORDER — DILTIAZEM HCL 30 MG PO TABS
30.0000 mg | ORAL_TABLET | Freq: Four times a day (QID) | ORAL | Status: DC
  Administered 2021-11-12 – 2021-11-14 (×6): 30 mg via ORAL
  Filled 2021-11-12 (×6): qty 1

## 2021-11-12 SURGICAL SUPPLY — 53 items
APL PRP STRL LF DISP 70% ISPRP (MISCELLANEOUS) ×1
BALL HIP ARTICU 28 +5 (Hips) IMPLANT
BIPOLAR PROS AML 46 (Hips) ×2 IMPLANT
BIT DRILL 2.8X128 (BIT) ×2 IMPLANT
BLADE SAGITTAL 25.0X1.27X90 (BLADE) ×2 IMPLANT
CHLORAPREP W/TINT 26 (MISCELLANEOUS) ×2 IMPLANT
CLOTH BEACON ORANGE TIMEOUT ST (SAFETY) ×2 IMPLANT
COVER LIGHT HANDLE STERIS (MISCELLANEOUS) ×4 IMPLANT
DECANTER SPIKE VIAL GLASS SM (MISCELLANEOUS) ×2 IMPLANT
DRAPE HIP W/POCKET STRL (MISCELLANEOUS) ×2 IMPLANT
DRAPE SURG 17X23 STRL (DRAPES) ×2 IMPLANT
DRAPE U-SHAPE 47X51 STRL (DRAPES) ×2 IMPLANT
DRSG AQUACEL AG ADV 3.5X10 (GAUZE/BANDAGES/DRESSINGS) ×1 IMPLANT
DRSG MEPILEX SACRM 8.7X9.8 (GAUZE/BANDAGES/DRESSINGS) ×2 IMPLANT
ELECT REM PT RETURN 9FT ADLT (ELECTROSURGICAL) ×2
ELECTRODE REM PT RTRN 9FT ADLT (ELECTROSURGICAL) ×1 IMPLANT
GLOVE SRG 8 PF TXTR STRL LF DI (GLOVE) ×1 IMPLANT
GLOVE SURG POLYISO LF SZ8 (GLOVE) ×8 IMPLANT
GLOVE SURG UNDER POLY LF SZ7 (GLOVE) ×7 IMPLANT
GLOVE SURG UNDER POLY LF SZ8 (GLOVE) ×2
GOWN STRL REUS W/ TWL XL LVL3 (GOWN DISPOSABLE) ×1 IMPLANT
GOWN STRL REUS W/TWL LRG LVL3 (GOWN DISPOSABLE) ×4 IMPLANT
GOWN STRL REUS W/TWL XL LVL3 (GOWN DISPOSABLE) ×2
HANDPIECE INTERPULSE COAX TIP (DISPOSABLE) ×2
HEAD BIPOLAR PROS AML 46 (Hips) IMPLANT
HIP BALL ARTICU 28 +5 (Hips) ×2 IMPLANT
INST SET MAJOR BONE (KITS) ×2 IMPLANT
IV NS IRRIG 3000ML ARTHROMATIC (IV SOLUTION) ×2 IMPLANT
KIT BLADEGUARD II DBL (SET/KITS/TRAYS/PACK) ×2 IMPLANT
KIT TURNOVER KIT A (KITS) ×2 IMPLANT
MANIFOLD NEPTUNE II (INSTRUMENTS) ×2 IMPLANT
MARKER SKIN DUAL TIP RULER LAB (MISCELLANEOUS) ×2 IMPLANT
NDL HYPO 18GX1.5 BLUNT FILL (NEEDLE) ×1 IMPLANT
NDL HYPO 21X1.5 SAFETY (NEEDLE) ×1 IMPLANT
NEEDLE HYPO 18GX1.5 BLUNT FILL (NEEDLE) ×2 IMPLANT
NEEDLE HYPO 21X1.5 SAFETY (NEEDLE) ×2 IMPLANT
NS IRRIG 1000ML POUR BTL (IV SOLUTION) ×2 IMPLANT
PACK SURGICAL SETUP 50X90 (CUSTOM PROCEDURE TRAY) ×2 IMPLANT
PACK TOTAL JOINT (CUSTOM PROCEDURE TRAY) ×2 IMPLANT
PAD ARMBOARD 7.5X6 YLW CONV (MISCELLANEOUS) ×2 IMPLANT
SET BASIN LINEN APH (SET/KITS/TRAYS/PACK) ×2 IMPLANT
SET HNDPC FAN SPRY TIP SCT (DISPOSABLE) ×1 IMPLANT
SLEEVE CABLE 2MM VT (Orthopedic Implant) ×1 IMPLANT
STAPLER VISISTAT (STAPLE) ×2 IMPLANT
STEM SUMMIT BASIC PRESSFIT SZ5 (Hips) ×1 IMPLANT
SUT MNCRL AB 4-0 PS2 18 (SUTURE) ×4 IMPLANT
SUT MON AB 2-0 CT1 36 (SUTURE) ×2 IMPLANT
SUT VIC AB 1 CT1 27 (SUTURE) ×8
SUT VIC AB 1 CT1 27XBRD ANTBC (SUTURE) ×4 IMPLANT
SYR 30ML LL (SYRINGE) ×4 IMPLANT
SYR BULB IRRIG 60ML STRL (SYRINGE) ×4 IMPLANT
WATER STERILE IRR 1000ML POUR (IV SOLUTION) ×4 IMPLANT
YANKAUER SUCT 12FT TUBE ARGYLE (SUCTIONS) ×2 IMPLANT

## 2021-11-12 NOTE — Anesthesia Preprocedure Evaluation (Signed)
Anesthesia Evaluation  ?Patient identified by MRN, date of birth, ID band ?Patient awake ? ? ? ?Reviewed: ?Allergy & Precautions, H&P , NPO status , Patient's Chart, lab work & pertinent test results, reviewed documented beta blocker date and time  ? ?Airway ?Mallampati: II ? ?TM Distance: >3 FB ?Neck ROM: full ? ? ? Dental ?no notable dental hx. ? ?  ?Pulmonary ?COPD,  ?  ?Pulmonary exam normal ?breath sounds clear to auscultation ? ? ? ? ? ? Cardiovascular ?Exercise Tolerance: Good ?negative cardio ROS ? ? ?Rhythm:regular Rate:Normal ? ? ?  ?Neuro/Psych ?negative neurological ROS ? negative psych ROS  ? GI/Hepatic ?Neg liver ROS, GERD  Medicated,  ?Endo/Other  ?negative endocrine ROS ? Renal/GU ?CRF and ARFRenal disease  ?negative genitourinary ?  ?Musculoskeletal ? ? Abdominal ?  ?Peds ? Hematology ? ?(+) Blood dyscrasia, anemia ,   ?Anesthesia Other Findings ? ? Reproductive/Obstetrics ?negative OB ROS ? ?  ? ? ? ? ? ? ? ? ? ? ? ? ? ?  ?  ? ? ? ? ? ? ? ? ?Anesthesia Physical ?Anesthesia Plan ? ?ASA: 3 ? ?Anesthesia Plan: General and General ETT  ? ?Post-op Pain Management:   ? ?Induction:  ? ?PONV Risk Score and Plan: Ondansetron ? ?Airway Management Planned:  ? ?Additional Equipment:  ? ?Intra-op Plan:  ? ?Post-operative Plan:  ? ?Informed Consent: I have reviewed the patients History and Physical, chart, labs and discussed the procedure including the risks, benefits and alternatives for the proposed anesthesia with the patient or authorized representative who has indicated his/her understanding and acceptance.  ? ? ? ?Dental Advisory Given ? ?Plan Discussed with: CRNA ? ?Anesthesia Plan Comments:   ? ? ? ? ? ? ?Anesthesia Quick Evaluation ? ?

## 2021-11-12 NOTE — Progress Notes (Signed)
? ?  ORTHOPAEDIC PROGRESS NOTE ? ?Scheduled for Left Hip hemiarthroplasty ? ?DOS: 11/12/2021 ? ?SUBJECTIVE: ?No issues over night.  Still complains of pain in her left hip.  She is thirsty.  NPO since midnight.  ? ?OBJECTIVE: ?PE: ? ?Alert and oriented.  No acute distress ? ?Hip abduction pillow in place ?ROM L hip deferred ?Toes are warm and well perfused. ?2+ DP pulse ?Active motion of EHL/TA ? ?Vitals:  ? 11/11/21 2031 11/12/21 0318  ?BP: (!) 116/48 117/70  ?Pulse: 82 96  ?Resp: 18 18  ?Temp: 98.1 ?F (36.7 ?C) 98.8 ?F (37.1 ?C)  ?SpO2: 99% 96%  ? ?CBC Latest Ref Rng & Units 11/11/2021 11/10/2021 02/04/2021  ?WBC 4.0 - 10.5 K/uL 11.4(H) 13.8(H) 8.8  ?Hemoglobin 12.0 - 15.0 g/dL 2.0(N) 47.0 11.6(L)  ?Hematocrit 36.0 - 46.0 % 31.9(L) 37.3 37.3  ?Platelets 150 - 400 K/uL 263 337 309  ? ? ? ?ASSESSMENT: ?Diana Mata is a 88 y.o. female doing well.  Ready for OR.  NPO since midnight.  Last dose of Xarelto 11/09/21, PM. ? ?PLAN: ?Weightbearing: NWB LLE ?Insicional and dressing care: Reinforce dressings as needed; none currently ?Orthopedic device(s): None ?VTE prophylaxis: Xarelto, ok to resume POD#1 ?Pain control: PO pain medications as needed; judicious use of narcotics ?Follow - up plan: 2 weeks ? ? ?Contact information:   ? ? ?Angelisa Winthrop A. Dallas Schimke, MD MS ?[facility] ?[address] ?[place],  [place]  [address] ?Phone: [phone] ?Fax: [phone] ?  ? ?

## 2021-11-12 NOTE — Transfer of Care (Signed)
Immediate Anesthesia Transfer of Care Note ? ?Patient: Diana Mata ? ?Procedure(s) Performed: ARTHROPLASTY BIPOLAR HIP (HEMIARTHROPLASTY) (Left: Hip) ? ?Patient Location: PACU ? ?Anesthesia Type:General ? ?Level of Consciousness: awake, alert  and oriented ? ?Airway & Oxygen Therapy: Patient Spontanous Breathing ? ?Post-op Assessment: Report given to RN and Post -op Vital signs reviewed and stable ? ?Post vital signs: Reviewed and stable ? ?Last Vitals:  ?Vitals Value Taken Time  ?BP 124/35 11/12/21 1028  ?Temp    ?Pulse 91 11/12/21 1029  ?Resp 17 11/12/21 1029  ?SpO2 93 % 11/12/21 1029  ?Vitals shown include unvalidated device data. ? ?Last Pain:  ?Vitals:  ? 11/12/21 0534  ?TempSrc:   ?PainSc: 6   ?   ? ?Patients Stated Pain Goal: 0 (11/12/21 0505) ? ?Complications: No notable events documented. ?

## 2021-11-12 NOTE — Anesthesia Postprocedure Evaluation (Signed)
Anesthesia Post Note ? ?Patient: Diana Mata ? ?Procedure(s) Performed: ARTHROPLASTY BIPOLAR HIP (HEMIARTHROPLASTY) (Left: Hip) ? ?Patient location during evaluation: PACU ?Anesthesia Type: General ?Level of consciousness: awake and alert ?Pain management: pain level controlled ?Vital Signs Assessment: post-procedure vital signs reviewed and stable ?Respiratory status: spontaneous breathing, nonlabored ventilation, respiratory function stable and patient connected to nasal cannula oxygen ?Cardiovascular status: blood pressure returned to baseline and stable ?Postop Assessment: no apparent nausea or vomiting ?Anesthetic complications: no ? ? ?No notable events documented. ? ? ?Last Vitals:  ?Vitals:  ? 11/12/21 1029 11/12/21 1030  ?BP:  (!) 124/35  ?Pulse: 87 91  ?Resp: 13 17  ?Temp: 36.7 ?C   ?SpO2:    ?  ?Last Pain:  ?Vitals:  ? 11/12/21 0534  ?TempSrc:   ?PainSc: 6   ? ? ?  ?  ?  ?  ?  ?  ? ?Louann Sjogren ? ? ? ? ?

## 2021-11-12 NOTE — Progress Notes (Signed)
?   11/12/21 1951  ?Vitals  ?Temp 98.6 ?F (37 ?C)  ?Temp Source Oral  ?BP 116/60  ?MAP (mmHg) 77  ?BP Location Left Arm  ?BP Method Automatic  ?Patient Position (if appropriate) Lying  ?Pulse Rate (!) 132  ?Resp 18  ?MEWS COLOR  ?MEWS Score Color Yellow  ?Oxygen Therapy  ?SpO2 93 %  ?O2 Device Room Air  ?Pain Assessment  ?Pain Scale 0-10  ?Pain Score 4  ?Pain Type Surgical pain  ?Pain Location Hip  ?Pain Orientation Left  ?Pain Descriptors / Indicators Aching  ?Pain Intervention(s) Rest  ?Multiple Pain Sites No  ?MEWS Score  ?MEWS Temp 0  ?MEWS Systolic 0  ?MEWS Pulse 3  ?MEWS RR 0  ?MEWS LOC 0  ?MEWS Score 3  ? ?Patient denies need for pain medication at this time. Denies any shortness of breath or feeling like her heart is racing. Patient's heart rate ranging from 120's-140's, afib on the monitor. Patients daughter and Dr. Josephine Cables at bedside at this time.  ?

## 2021-11-12 NOTE — Anesthesia Procedure Notes (Signed)
Procedure Name: Intubation ?Date/Time: 11/12/2021 1:58 AM ?Performed by: Louann Sjogren, MD ?Pre-anesthesia Checklist: Patient identified, Emergency Drugs available, Suction available and Patient being monitored ?Patient Re-evaluated:Patient Re-evaluated prior to induction ?Oxygen Delivery Method: Circle system utilized ?Preoxygenation: Pre-oxygenation with 100% oxygen ?Induction Type: IV induction ?Ventilation: Mask ventilation without difficulty ?Laryngoscope Size: Mac and 3 ?Grade View: Grade II ?Tube type: Oral ?Tube size: 7.0 mm ?Number of attempts: 1 ?Airway Equipment and Method: Stylet and Oral airway ?Placement Confirmation: ETT inserted through vocal cords under direct vision, positive ETCO2 and breath sounds checked- equal and bilateral ?Tube secured with: Tape ?Dental Injury: Teeth and Oropharynx as per pre-operative assessment  ? ? ? ? ?

## 2021-11-12 NOTE — Progress Notes (Signed)
Tele called patient was in afib HR 140s. Patient stable and prn pain medication administered for let hip pain. MD notified of HR 140s ordered EKG 12 lead. MD on unit to read EKG showed ST HR 130, no new orders given at this time. Charge nurse aware. Will contiune to monitor.  ?

## 2021-11-12 NOTE — Progress Notes (Signed)
?   11/12/21 2256  ?Vitals  ?Temp 99.1 ?F (37.3 ?C)  ?Temp Source Oral  ?BP (!) 117/49  ?MAP (mmHg) 69  ?BP Location Left Arm  ?BP Method Automatic  ?Patient Position (if appropriate) Lying  ?Pulse Rate (!) 101  ?Resp 18  ?MEWS COLOR  ?MEWS Score Color Green  ?Oxygen Therapy  ?SpO2 95 %  ?O2 Device Nasal Cannula  ?O2 Flow Rate (L/min) 2 L/min  ?MEWS Score  ?MEWS Temp 0  ?MEWS Systolic 0  ?MEWS Pulse 1  ?MEWS RR 0  ?MEWS LOC 0  ?MEWS Score 1  ? ?Patient's heart rate ranging from 120's to 150's on telemetry monitor. Patient's peak heart rate at rest of 170 bpm. Upon walking into room, patient resting in bed with eyes closed. Notified Dr. Josephine Cables. Upon checking vital signs, patients heart rate now back down and sustaining in the low 100's-110's at this time. Instructed by Dr. Josephine Cables to monitor for now.  ?

## 2021-11-12 NOTE — Op Note (Signed)
Orthopaedic Surgery Operative Note (CSN: [id number]) ? ?Diana Mata  1931-06-07 ?Date of Surgery: 11/12/2021 ? ? ?Diagnoses:  ?Displaced left femoral neck fracture ? ?Procedure: ?Left hip hemiarthroplasty ?  ?Operative Finding ?Successful completion of the planned procedure.  Placement of left hip hemiarthroplasty.  Hip was stable to 90 degrees of flexion, 45 degrees of internal rotation.  Able to achieve full extension.  Small vertical fracture when placing the stem, subsequently placed a cerclage wire and she will remain TTWB for 6 weeks.  ? ? ?Post-Op Diagnosis: Same ?Surgeons:Primary: Mordecai Rasmussen, MD ?Assistants: Marquita Palms ?Location: [facility] OR ROOM 4 ?Anesthesia: General with local anesthesia ?Antibiotics: Ancef 2 g with local vancomycin powder 1 g at the surgical site ?Tourniquet time: N/A ?Estimated Blood Loss: 250 cc ?Complications: None ?Specimens: None ? ?Implants: ?Implant Name Type Inv. Item Serial No. Manufacturer Lot No. LRB No. Used Action  ?SLEEVE CABLE 2MM VT - [id number] Orthopedic Implant SLEEVE CABLE 2MM VT  [employer] [id number] Left 1 Implanted  ?STEM SUMMIT BASIC PRESSFIT SZ5 - [id number] Hips STEM SUMMIT BASIC PRESSFIT SZ5  [employer] [id number] Left 1 Implanted  ?HIP BALL ARTICU 28 +5 - [id number] Hips HIP BALL ARTICU 28 +5  [employer] [id number] Left 1 Implanted  ?HIP BALL ARTICU 28 +5 - [id number] Hips HIP BALL ARTICU 28 +5  [employer] [id number] Left 1 Implanted  ? ? ?Indications for Surgery:   ?Diana Mata is a 88 y.o. female who sustained a displaced Left femoral neck fracture.  In order to restore previous form and function, I recommended a hip hemiarthroplasty.  Risks and benefits of operative and nonoperative management were discussed prior to surgery with the patient and informed consent form was completed.  Specific risks including infection, need for additional surgery, bleeding, damage to surrounding structures, dislocation, nonunion of the  implants and more severe complications associated with anesthesia.  The patient elected to proceed.  ? ? ?Procedure:   ?The patient was identified properly. Informed consent was obtained and the surgical site was marked. The patient was taken to the OR where general anesthesia was induced.  The patient was positioned lateral with her left leg facing up.  The left leg was prepped and draped in the usual sterile fashion.  Timeout was performed before the beginning of the case.  Ancef dosing was confirmed prior to incision.  Patient received 1 g of TXA before the start of the case ? ?We started by making an incision centered over the greater trochanter with a scalpel. We used the scalpel to continue to dissect to the fascia.  Electrocautery was used to help with hemostasis.  A cobb elevator was used to clear the IT band and then it was pierced with Bovie electrocautery. Mayo scissors were used to cut the fascia in a longitudinal fashion. The gluteus maximus fibers were bluntly split in line with their fibers.  ? ?The femur was slowly internally rotated, putting tension on the posterior structures. The short external rotators were identified and tagged with #1 vicryl.  Bovie electrocautery was used to dissect the short external rotators off of the insertion onto the femur.  Next, we identified the capsule and made a T capsulotomy.  The capsule was then tagged with #1 Vicryl sutures.   Next, we visualized the femoral neck fracture. We identified the sciatic nerve by palpation and verified that it was not in danger during the dissection.   ? ?We then made a neck cut approximately 1 cm above the lesser  trochanter with a saw. We removed the femoral head. We used the box cutter to cut away some of the greater trochanter for ease of insertion of the stem. We then used the canal finder to locate the femoral canal.  ? ?Using the angle of the femoral neck as our guide for version, we broached sequentially. We trialed components  and found appropriate fit and stability.  The patient's bone was stable enough to get an excellent pressfit with the stem.  We placed an appropriately sized head on the stem.  The hip was reduced and we noted full extension of the hip. The hip was stable at 90 degrees of flexion, and 45 degrees of internal rotation.  Leg lengths were approximately equal.   When we attempted to dislocate the hip to remove the trials, a small vertical fracture was identified.  The stem was no longer rotationally stable.  As a result, we placed a cerclage wire around the femur, just proximal to the lesser trochanter to restore the hoop stress around the implant.  The implanted stem was then positioned flush against the calcar, and the cerclage was tensioned to secure the implant in place.  At this point, the stem was flush against the calcar and stable within the canal.  The wire was crimped and truncated.   ? ?We then removed the trials as well as the broach. We ensured there were no foreign bodies or bone within the acetabulum. We copiously irrigated the wound.   ? ?An appropriately sized head was placed on to the stem and the hip was reduced. Again, we noted it was stable in the previously mentioned manipulations.  ? ?The wound was copiously irrigated again.  We place 1 g of Vancomycin powder within the hip.  Next,  we repaired the posterior capsule.  The short external rotators were repaired to the greater trochanter through bone tunnels. We closed the fascia of the iliotibial band and gluteus maximus with running and intterupted Vicryl sutures. We then closed Scarpa's fascia with Vicryl sutures. Skin was closed with 2-0 monocryl and staples and an Aquacel dressing was placed.  A hib abduction pillow was secured.  Patient was awoken taken to PACU in stable condition. ? ? ?Post-operative plan:  ?The patient will be TTWB on the operative extremity. ?Hip abduction pillow in place at all times when in bed ?Posterior hip  precautions ?PT/OT    ?DVT prophylaxis per primary team, no orthopedic contraindications.   Resume Xarelto POD#1 ?Pain control with PRN pain medication preferring oral medicines.   ?Follow up plan will be scheduled in approximately 14 days for incision check and XR. ? ?

## 2021-11-12 NOTE — Progress Notes (Signed)
?   11/12/21 2054  ?Vitals  ?Temp 98.9 ?F (37.2 ?C)  ?Temp Source Oral  ?BP (!) 110/51  ?MAP (mmHg) 68  ?BP Location Left Arm  ?BP Method Automatic  ?Patient Position (if appropriate) Lying  ?Pulse Rate 99  ?Pulse Rate Source Monitor  ?ECG Heart Rate (!) 105  ?Resp 16  ?MEWS COLOR  ?MEWS Score Color Green  ?Oxygen Therapy  ?SpO2 (!) 88 %  ?O2 Device Room Air  ?MEWS Score  ?MEWS Temp 0  ?MEWS Systolic 0  ?MEWS Pulse 1  ?MEWS RR 0  ?MEWS LOC 0  ?MEWS Score 1  ? ? Instructed by Dr. Thomes Dinning to give scheduled Cardizem 30 mg PO dose now. Dose administered. Patient's heart rate 105 at this time. Patient placed back on 2L [place] due to patients O2 sat being 88% on room air. Patients O2 sat now 96% on 2L [place].  ?

## 2021-11-12 NOTE — Progress Notes (Addendum)
PROGRESS NOTE  Diana Mata [id number] DOB: 1931-02-15 DOA: 11/10/2021 PCP: Celene Squibb, MD  Brief History:  88 year old female with a history of paroxysmal atrial fibrillation, COPD, GERD, IBS, left bundle branch block, CKD stage III presenting with mechanical fall and left hip pain.  The patient was walking to her kitchen when she tripped and fell onto her left side.  She did not lose consciousness.  Ultimately, she was able to activate EMS.  At baseline, the patient is independent with all her ADLs and lives alone.  The patient had been in her usual state of health prior to her mechanical fall.  She denies fevers, chills, headache, chest pain, shortness of breath, coughing, hemoptysis, nausea, vomiting, diarrhea, abdominal pain, dysuria.   In the ED, the patient was afebrile hemodynamically stable with oxygen saturation 100% 2 L.  BMP showed sodium 134, potassium 3.8, bicarbonate 19, serum creatinine 1.04.  WBC 13.8, hemoglobin 12.0, platelets 237,000.  X-ray of the left hip showed a left subcapital femoral neck fracture.  Orthopedics was consulted and plans for operative repair on 11/13/2021. During evening 3/10-3/11, pt had paroxysms of PAF with RVR and given one dose IV lopressor.   Pt was asymptomatic.  Back in sinus rhythm at time of my eval 11/12/21 am.    Assessment and Plan: * Closed displaced fracture of left femoral neck (HCC) Left hip x-ray showed left subcapital femoral neck fracture Continue Norco 5-325mg  p.o. every 6 hours as needed for pain Continue fall precaution and neurochecks Consider PT/OT eval and treat status post surgical repair of femoral neck fracture Case discussed with orthopedic surgery, Dr. Amedeo Kinsman Last dose Xarelto on 11/10/21 at 10AM  PAF (paroxysmal atrial fibrillation) (HCC) Currently in sinus rhythm 3/10-3/11 evening pt had paroxysms of RVR Rate improved with IV lopressor Back in sinus at time of my eval 11/12/21 early AM, BP  114/56 Transition temporarily to IV lopressor while npo Holding Xarelto in preparation for surgery--last dose on 11/10/2021 at 10 AM RCRI cardiac index--remains low risk for ortho surgery  Chronic kidney disease, stage 3a (HCC) Baseline creatinine 0.8-1.1  Leukocytosis Secondary to stress demargination  Fall Continue fall precaution and neurochecks Consider PT/OT eval and treat status post surgical repair of femoral neck fracture  Overactive bladder Continue gemtesa with therapeutic interchange  GERD (gastroesophageal reflux disease) Continue Protonix    Status is: Inpatient Remains inpatient appropriate because: severity of illness requiring operative management       Family Communication:   daughter updated 3/11   Consultants:  ortho--[place]   Code Status:  DNR   DVT Prophylaxis:  Xarelto on hold     Procedures: As Listed in Progress Note Above   Antibiotics: None     Subjective: Pt had some paroxysms afib RVR overnight.  Denies cp, sob, n/v/d, abd pain, f/c.  Objective: Vitals:   11/11/21 1346 11/11/21 1815 11/11/21 2031 11/12/21 0318  BP: (!) 112/49 (!) 148/114 (!) 116/48 117/70  Pulse: 76 81 82 96  Resp: 18 20 18 18   Temp: 98.4 F (36.9 C) 99.2 F (37.3 C) 98.1 F (36.7 C) 98.8 F (37.1 C)  TempSrc: Oral  Oral Oral  SpO2: 100% 99% 99% 96%  Weight:      Height:        Intake/Output Summary (Last 24 hours) at 11/12/2021 0707 Last data filed at 11/11/2021 1700 Gross per 24 hour  Intake 360 ml  Output --  Net 360 ml  Weight change:  Exam:  General:  Pt is alert, follows commands appropriately, not in acute distress HEENT: No icterus, No thrush, No neck mass, [place]/AT Cardiovascular: RRR, S1/S2, no rubs, no gallops Respiratory: fine bibasilar rales. No wheeze Abdomen: Soft/+BS, non tender, non distended, no guarding Extremities: No edema, No lymphangitis, No petechiae, No rashes, no synovitis   Data Reviewed: I have personally reviewed  following labs and imaging studies Basic Metabolic Panel: Recent Labs  Lab 11/10/21 2119 11/11/21 0445  NA 134* 135  K 3.8 3.8  CL 104 108  CO2 19* 19*  GLUCOSE 105* 99  BUN 23 21  CREATININE 1.04* 1.00  CALCIUM 8.8* 8.2*  MG  --  2.0  PHOS  --  3.5   Liver Function Tests: Recent Labs  Lab 11/10/21 2119 11/11/21 0445  AST 23 66*  ALT 20 43  ALKPHOS 77 78  BILITOT 0.5 0.5  PROT 7.3 5.8*  ALBUMIN 4.0 3.2*   No results for input(s): LIPASE, AMYLASE in the last 168 hours. No results for input(s): AMMONIA in the last 168 hours. Coagulation Profile: No results for input(s): INR, PROTIME in the last 168 hours. CBC: Recent Labs  Lab 11/10/21 2119 11/11/21 0445  WBC 13.8* 11.4*  NEUTROABS 8.3*  --   HGB 12.0 9.6*  HCT 37.3 31.9*  MCV 87.4 88.6  PLT 337 263   Cardiac Enzymes: No results for input(s): CKTOTAL, CKMB, CKMBINDEX, TROPONINI in the last 168 hours. BNP: Invalid input(s): POCBNP CBG: No results for input(s): GLUCAP in the last 168 hours. HbA1C: No results for input(s): HGBA1C in the last 72 hours. Urine analysis:    Component Value Date/Time   COLORURINE YELLOW 11/11/2021 1755   APPEARANCEUR HAZY (A) 11/11/2021 1755   APPEARANCEUR Hazy (A) 11/16/2020 1342   LABSPEC 1.019 11/11/2021 1755   PHURINE 6.0 11/11/2021 1755   GLUCOSEU NEGATIVE 11/11/2021 1755   HGBUR SMALL (A) 11/11/2021 1755   BILIRUBINUR NEGATIVE 11/11/2021 1755   BILIRUBINUR Negative 11/16/2020 1342   KETONESUR 5 (A) 11/11/2021 1755   PROTEINUR NEGATIVE 11/11/2021 1755   UROBILINOGEN negative (A) 09/24/2019 1629   NITRITE NEGATIVE 11/11/2021 1755   LEUKOCYTESUR LARGE (A) 11/11/2021 1755   Sepsis Labs: @LABRCNTIP (procalcitonin:4,lacticidven:4) ) Recent Results (from the past 240 hour(s))  Resp Panel by RT-PCR (Flu A&B, Covid) Nasopharyngeal Swab     Status: None   Collection Time: 11/10/21 11:19 PM   Specimen: Nasopharyngeal Swab; Nasopharyngeal(NP) swabs in vial transport medium   Result Value Ref Range Status   SARS Coronavirus 2 by RT PCR NEGATIVE NEGATIVE Final    Comment: (NOTE) SARS-CoV-2 target nucleic acids are NOT DETECTED.  The SARS-CoV-2 RNA is generally detectable in upper respiratory specimens during the acute phase of infection. The lowest concentration of SARS-CoV-2 viral copies this assay can detect is 138 copies/mL. A negative result does not preclude SARS-Cov-2 infection and should not be used as the sole basis for treatment or other patient management decisions. A negative result may occur with  improper specimen collection/handling, submission of specimen other than nasopharyngeal swab, presence of viral mutation(s) within the areas targeted by this assay, and inadequate number of viral copies(<138 copies/mL). A negative result must be combined with clinical observations, patient history, and epidemiological information. The expected result is Negative.  Fact Sheet for Patients:  EntrepreneurPulse.com.au  Fact Sheet for Healthcare Providers:  IncredibleEmployment.be  This test is no t yet approved or cleared by the [place] FDA and  has been authorized for detection and/or  diagnosis of SARS-CoV-2 by FDA under an Emergency Use Authorization (EUA). This EUA will remain  in effect (meaning this test can be used) for the duration of the COVID-19 declaration under Section 564(b)(1) of the Act, 21 U.S.C.section 360bbb-3(b)(1), unless the authorization is terminated  or revoked sooner.       Influenza A by PCR NEGATIVE NEGATIVE Final   Influenza B by PCR NEGATIVE NEGATIVE Final    Comment: (NOTE) The Xpert Xpress SARS-CoV-2/FLU/RSV plus assay is intended as an aid in the diagnosis of influenza from Nasopharyngeal swab specimens and should not be used as a sole basis for treatment. Nasal washings and aspirates are unacceptable for Xpert Xpress SARS-CoV-2/FLU/RSV testing.  Fact Sheet for  Patients: EntrepreneurPulse.com.au  Fact Sheet for Healthcare Providers: IncredibleEmployment.be  This test is not yet approved or cleared by the [place] FDA and has been authorized for detection and/or diagnosis of SARS-CoV-2 by FDA under an Emergency Use Authorization (EUA). This EUA will remain in effect (meaning this test can be used) for the duration of the COVID-19 declaration under Section 564(b)(1) of the Act, 21 U.S.C. section 360bbb-3(b)(1), unless the authorization is terminated or revoked.  Performed at [facility], [address] [place], [place] [address]   Surgical PCR screen     Status: Abnormal   Collection Time: 11/11/21  4:30 PM   Specimen: Nasal Mucosa; Nasal Swab  Result Value Ref Range Status   MRSA, PCR POSITIVE (A) NEGATIVE Final    Comment: RESULT CALLED TO, READ BACK BY AND VERIFIED WITH: THOMAS,C @ 2012 ON 11/11/21 BY JUW    Staphylococcus aureus POSITIVE (A) NEGATIVE Final    Comment: (NOTE) The Xpert SA Assay (FDA approved for NASAL specimens in patients 52 years of age and older), is one component of a comprehensive surveillance program. It is not intended to diagnose infection nor to guide or monitor treatment. Performed at [facility], [address], [place], [place] [address]      Scheduled Meds:  diltiazem  30 mg Oral BID   feeding supplement  237 mL Oral BID BM   metoprolol tartrate  2.5 mg Intravenous Once   mupirocin ointment  1 application. Nasal BID   pantoprazole  40 mg Oral Daily   Continuous Infusions:  sodium chloride 75 mL/hr at 11/11/21 1359    ceFAZolin (ANCEF) IV     tranexamic acid      Procedures/Studies: DG Chest 1 View  Result Date: 11/10/2021 CLINICAL DATA:  Known left hip fracture, initial encounter EXAM: CHEST  1 VIEW COMPARISON:  02/04/2021 FINDINGS: Cardiac shadow is within normal limits. Aortic calcifications are seen. Lungs are well aerated bilaterally. No acute  infiltrate is seen. No bony abnormality is noted. IMPRESSION: No active disease. Electronically Signed   By: Inez Catalina M.D.   On: 11/10/2021 23:23   CT Head Wo Contrast  Result Date: 11/10/2021 CLINICAL DATA:  Head trauma fall EXAM: CT HEAD WITHOUT CONTRAST TECHNIQUE: Contiguous axial images were obtained from the base of the skull through the vertex without intravenous contrast. RADIATION DOSE REDUCTION: This exam was performed according to the departmental dose-optimization program which includes automated exposure control, adjustment of the mA and/or kV according to patient size and/or use of iterative reconstruction technique. COMPARISON:  CT 06/30/2018, FINDINGS: Brain: No acute territorial infarction,hemorrhage or intracranial mass. Moderate atrophy. Moderate chronic small vessel ischemic changes of the white matter. Stable ventricle size. Vascular: No hyperdense vessels.  Carotid vascular calcification. Skull: No fracture. 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 Sinuses/Orbits: No acute finding. Other: None IMPRESSION: 1. No CT evidence for acute intracranial abnormality. 2. Atrophy and chronic small vessel ischemic changes of the white matter. Electronically Signed   By: Donavan Foil M.D.   On: 11/10/2021 23:28   DG Hip Unilat W or Wo Pelvis 2-3 Views Left  Result Date: 11/10/2021 CLINICAL DATA:  Recent fall with left hip pain, initial encounter EXAM: DG HIP (WITH OR WITHOUT PELVIS) 3V LEFT COMPARISON:  None. FINDINGS: Pelvic ring is intact. Subcapital femoral neck fracture is noted on the left with impaction and angulation at the fracture site. Right hip replacement is noted. No soft tissue abnormality is seen. IMPRESSION: Left subcapital femoral neck fracture. Electronically Signed   By: Inez Catalina M.D.   On: 11/10/2021 23:23    Orson Eva, DO  [facility]  If 7PM-7AM, please contact  night-coverage [email or web address] Password [email or web address] 11/12/2021, 7:07 AM   LOS: 1 day

## 2021-11-12 NOTE — Progress Notes (Signed)
This Clinical research associate received call from tele stating pt heart rate was 150. This Clinical research associate took a set of vitals. Vitals noted in flowsheet. MD aware. One time dose for Lopressor given. RN, Fleet Contras administered this for pt. Vitals checked 30 mins later by this writer, pulse still maintaining 140's. MD aware again, orders to inform night shift MD as well as possible transfer. Night shift MD aware as well as oncoming nurse. ?

## 2021-11-13 ENCOUNTER — Inpatient Hospital Stay (HOSPITAL_COMMUNITY): Payer: No Typology Code available for payment source

## 2021-11-13 DIAGNOSIS — I4891 Unspecified atrial fibrillation: Secondary | ICD-10-CM

## 2021-11-13 DIAGNOSIS — N3281 Overactive bladder: Secondary | ICD-10-CM | POA: Diagnosis not present

## 2021-11-13 DIAGNOSIS — N1831 Chronic kidney disease, stage 3a: Secondary | ICD-10-CM | POA: Diagnosis not present

## 2021-11-13 DIAGNOSIS — D62 Acute posthemorrhagic anemia: Secondary | ICD-10-CM

## 2021-11-13 DIAGNOSIS — S72002A Fracture of unspecified part of neck of left femur, initial encounter for closed fracture: Secondary | ICD-10-CM | POA: Diagnosis not present

## 2021-11-13 LAB — HEMOGLOBIN AND HEMATOCRIT, BLOOD
HCT: 21.6 % — ABNORMAL LOW (ref 36.0–46.0)
Hemoglobin: 6.9 g/dL — CL (ref 12.0–15.0)

## 2021-11-13 LAB — CBC
HCT: 24.1 % — ABNORMAL LOW (ref 36.0–46.0)
Hemoglobin: 7.5 g/dL — ABNORMAL LOW (ref 12.0–15.0)
MCH: 27.4 pg (ref 26.0–34.0)
MCHC: 31.1 g/dL (ref 30.0–36.0)
MCV: 88 fL (ref 80.0–100.0)
Platelets: 220 10*3/uL (ref 150–400)
RBC: 2.74 MIL/uL — ABNORMAL LOW (ref 3.87–5.11)
RDW: 15.6 % — ABNORMAL HIGH (ref 11.5–15.5)
WBC: 19.2 10*3/uL — ABNORMAL HIGH (ref 4.0–10.5)
nRBC: 0 % (ref 0.0–0.2)

## 2021-11-13 LAB — IRON AND TIBC
Iron: 8 ug/dL — ABNORMAL LOW (ref 28–170)
Saturation Ratios: 5 % — ABNORMAL LOW (ref 10.4–31.8)
TIBC: 170 ug/dL — ABNORMAL LOW (ref 250–450)
UIBC: 162 ug/dL

## 2021-11-13 LAB — FERRITIN: Ferritin: 107 ng/mL (ref 11–307)

## 2021-11-13 LAB — ECHOCARDIOGRAM COMPLETE
Area-P 1/2: 4.77 cm2
Calc EF: 62.3 %
Height: 64 in
S' Lateral: 2.1 cm
Single Plane A2C EF: 67.3 %
Single Plane A4C EF: 57.1 %
Weight: 1904.77 oz

## 2021-11-13 LAB — BASIC METABOLIC PANEL
Anion gap: 9 (ref 5–15)
BUN: 22 mg/dL (ref 8–23)
CO2: 17 mmol/L — ABNORMAL LOW (ref 22–32)
Calcium: 7.7 mg/dL — ABNORMAL LOW (ref 8.9–10.3)
Chloride: 109 mmol/L (ref 98–111)
Creatinine, Ser: 1.04 mg/dL — ABNORMAL HIGH (ref 0.44–1.00)
GFR, Estimated: 52 mL/min — ABNORMAL LOW (ref >60)
Glucose, Bld: 133 mg/dL — ABNORMAL HIGH (ref 70–99)
Potassium: 3.7 mmol/L (ref 3.5–5.1)
Sodium: 135 mmol/L (ref 135–145)

## 2021-11-13 LAB — VITAMIN B12: Vitamin B-12: 1326 pg/mL — ABNORMAL HIGH (ref 180–914)

## 2021-11-13 LAB — FOLATE: Folate: 9.6 ng/mL (ref >5.9)

## 2021-11-13 LAB — MAGNESIUM: Magnesium: 1.8 mg/dL (ref 1.7–2.4)

## 2021-11-13 LAB — PREPARE RBC (CROSSMATCH)

## 2021-11-13 MED ORDER — HYDROCODONE-ACETAMINOPHEN 5-325 MG PO TABS
1.0000 | ORAL_TABLET | ORAL | Status: DC | PRN
  Administered 2021-11-13 – 2021-11-17 (×5): 1 via ORAL
  Filled 2021-11-13 (×5): qty 1

## 2021-11-13 MED ORDER — TRAZODONE HCL 50 MG PO TABS
25.0000 mg | ORAL_TABLET | Freq: Every day | ORAL | Status: DC
  Administered 2021-11-13 – 2021-11-16 (×4): 25 mg via ORAL
  Filled 2021-11-13 (×4): qty 1

## 2021-11-13 MED ORDER — SODIUM CHLORIDE 0.9% IV SOLUTION: Freq: Once | INTRAVENOUS | Status: AC

## 2021-11-13 MED ORDER — ONDANSETRON HCL 4 MG/2ML IJ SOLN: 4.0000 mg | Freq: Four times a day (QID) | INTRAMUSCULAR | Status: DC | PRN

## 2021-11-13 NOTE — Assessment & Plan Note (Addendum)
Hgb dropped 12>>7.5 Due to hip fracture Partly dilution Transfused 2 units PRBC 3/12 Iron studies--iron sat 5, ferritin 107>>nulecit x 1 on 3/13 Repeat nulecit 3/15 am Hgb now stable for several days

## 2021-11-13 NOTE — Progress Notes (Deleted)
Lab called this Clinical research associate with critical lab Hgb of 6.9. MD made aware. 2 units of PRBS's ordered. 1 unit running currently with issues or complaints. Pt daughter and son in law in room at bedside.  ?

## 2021-11-13 NOTE — Op Note (Deleted)
? ?  ORTHOPAEDIC PROGRESS NOTE ? ?s/p Procedure(s): ?Left hip hemiarthroplasty with placement of cerclage cable ? ?DOS: 3/11/ ? ?SUBJECTIVE: ?No issues over night.  Pain is controlled.  Family at bedside, reporting some confusion.   ? ?OBJECTIVE: ?PE: ? ?Alert.  Answers questions ? ?Hip abduction pillow in placed.  ?Toes are warm and well perfused ?Active motion EHL/TA ?Sensation intact to dorsum of the foot.  ?Dressing is clean, dry and intact ? ?Vitals:  ? 11/13/21 0546 11/13/21 0958  ?BP: (!) 108/52 (!) 129/53  ?Pulse: (!) 106 78  ?Resp: 18 18  ?Temp: (!) 97.5 ?F (36.4 ?C)   ?SpO2: 92% 100%  ? ?CBC Latest Ref Rng & Units 11/13/2021 11/12/2021 11/11/2021  ?WBC 4.0 - 10.5 K/uL 19.2(H) - 11.4(H)  ?Hemoglobin 12.0 - 15.0 g/dL 7.5(L) 9.1(L) 9.6(L)  ?Hematocrit 36.0 - 46.0 % 24.1(L) 29.3(L) 31.9(L)  ?Platelets 150 - 400 K/uL 220 - 263  ?  ? ?ASSESSMENT: ?Diana Mata is a 88 y.o. female stable postoperatively.  Hb trending down, consider transfusion pending repeat H&H ? ?PLAN: ?Weightbearing: TDWB LLE; ambulate with a walker at all times ?Insicional and dressing care: Reinforce dressings as needed ?Orthopedic device(s):  Hip abduction pillow at all times when in bed ?VTE prophylaxis: Xarelto to resume POD#1 ?Pain control: As needed.  Judicious use of narcotics.  ?Follow - up plan: 2 weeks ? ? ?Contact information:   ? ? ?Joyanna Kleman A. Amedeo Kinsman, MD MS ?[facility] ?[address] ?[place],  [place]  [address] ?Phone: [phone] ?Fax: [phone] ? ? ?

## 2021-11-13 NOTE — Evaluation (Signed)
Physical Therapy Evaluation ?Patient Details ?Name: Diana Mata ?MRN: [id number] ?DOB: [birth year] ?Today's Date: 11/13/2021 ? ?History of Present Illness ? Diana Mata is a 88 y.o. female with medical history significant of  atrial flutter, COPD, atrial fibrillation, GERD who presented to the emergency department due to left hip pain.  Patient states that she lives alone and said that she sustained a fall at home and that she hit her head (unable to tell me how he fell), she activated EMS who noted her to be in A-fib with RVR which resolved prior to arrival to the ED.  She was found to have a hip fx and recieved a Lt Hemiarthroplasty on 3/11.  ?Clinical Impression ? Per MD slight fx of femur during operation pt is to be tough down WB.  Pt agreeable to exercises today but resisted sitting to the point of holding onto bed rail to prevent therapist from assisting pt to sitting position.    ?   ? ?Recommendations for follow up therapy are one component of a multi-disciplinary discharge planning process, led by the attending physician.  Recommendations may be updated based on patient status, additional functional criteria and insurance authorization. ? ?Follow Up Recommendations Skilled nursing-short term rehab (<3 hours/day) ? ?  ?Assistance Recommended at Discharge  SNF  ?Patient can return home with the following ?  Not recommended ? ?  ?Equipment Recommendations None recommended   ?Recommendations for Other Services ?  OT ?  ?Functional Status Assessment    ? ?  ?Precautions / Restrictions Precautions ?Precautions: Posterior Hip ?Restrictions ?Weight Bearing Restrictions: Yes ?LLE Weight Bearing: Touchdown weight bearing  ? ?  ? ?Mobility ? Bed Mobility ?Overal bed mobility: Needs Assistance ?Bed Mobility: Supine to Sit ?  ?  ?Supine to sit: Max assist ?  ?  ?  ?   ?  ?  ?  ?  ?  ?  ?  ?   ?  ? ? ? ? ?Pertinent Vitals/Pain Faces 8/10 only with movement of Lt LE   ? ? ?Home Living Family/patient expects  to be discharged to:: Private residence ?Living Arrangements: Alone ?Available Help at Discharge: Family ?Type of Home: Other(Comment) ?Home Access: Level entry ?  ?  ?  ?Home Layout: One level ?Home Equipment: Gilmer Mor - single point ?   ?  ?Prior Function Prior Level of Function : Independent/Modified Independent ?  ?  ?  ?  ?  ?  ?  ?  ?  ? ? ?   ?Extremity/Trunk Assessment  ?   ?  ? ?Lower Extremity Assessment ?Lower Extremity Assessment: LLE deficits/detail ?LLE Deficits / Details: generally 2-/5 ?LLE: Unable to fully assess due to pain ?  ? ?   ?Communication  ? Communication: No difficulties  ?Cognition  Lethargic  ?  ?  ?  ?  ?  ?  ?  ?  ?  ?  ?  ?  ?  ?  ?  ?  ?  ?  ?  ?  ?  ? ?  ?   ?Exercises General Exercises - Lower Extremity ?Ankle Circles/Pumps: Both, AAROM, 10 reps ?Quad Sets: Both, 10 reps ?Glut sets x 10 ?Heel Slides: Left, AAROM, 5 reps; Rt AROM ?UE flexion B x 10  ?  ? ?Assessment/Plan  ?  ?PT Assessment Patient needs continued PT services  ?PT Problem List Decreased strength;Decreased range of motion;Decreased activity tolerance;Decreased balance;Decreased mobility;Pain ? ?   ?  ?PT Treatment Interventions Gait  training;Functional mobility training;Therapeutic exercise   ? ?PT Goals (Current goals can be found in the Care Plan section)  ?  ? ?  ?Frequency Min 5X/week ?  ? ? ?   ?AM-PAC PT "6 Clicks" Mobility  ?Outcome Measure Help needed turning from your back to your side while in a flat bed without using bedrails?: A Lot ?Help needed moving from lying on your back to sitting on the side of a flat bed without using bedrails?: Total ?Help needed moving to and from a bed to a chair (including a wheelchair)?: Total ?Help needed standing up from a chair using your arms (e.g., wheelchair or bedside chair)?: Total ?Help needed to walk in hospital room?: Total ?Help needed climbing 3-5 steps with a railing? : Total ?6 Click Score: 7 ? ?  ?End of Session   ?Activity Tolerance: Patient limited by pain;No  increased pain ?Patient left: in bed;with call bell/phone within reach ?Nurse Communication: Mobility status ?PT Visit Diagnosis: Muscle weakness (generalized) (M62.81);History of falling (Z91.81);Difficulty in walking, not elsewhere classified (R26.2) ?  ? ?Time: 1216-2446 ?PT Time Calculation (min) (ACUTE ONLY): 20 min ? ? ?Charges:   PT Evaluation ?$PT Eval Moderate Complexity: 1 Mod ?  ?  ?   ? ? ?Virgina Organ, PT CLT ?[phone]  ?11/13/2021, 12:26 PM ? ?

## 2021-11-13 NOTE — Progress Notes (Signed)
Lab called this Clinical research associate with critical lab of Hgb of 6.9 MD made aware. 2 units of PRBC's ordered. 1 unit running currently with no issues or complaints. Pt daughter and son in law in room at bedside.  ?

## 2021-11-13 NOTE — Plan of Care (Signed)
?  Problem: Acute Rehab PT Goals(only PT should resolve) ?Goal: Pt Will Go Supine/Side To Sit ?Flowsheets (Taken 11/13/2021 1148) ?Pt will go Supine/Side to Sit: with moderate assist ?Goal: Pt Will Go Sit To Supine/Side ?Flowsheets (Taken 11/13/2021 1148) ?Pt will go Sit to Supine/Side: with moderate assist ?Goal: Pt Will Transfer Bed To Chair/Chair To Bed ?Flowsheets (Taken 11/13/2021 1148) ?Pt will Transfer Bed to Chair/Chair to Bed: with mod assist ?Goal: Pt Will Ambulate ?Flowsheets (Taken 11/13/2021 1148) ?Pt will Ambulate: ? 10 feet ? with minimal assist ? with rolling walker ?  ?

## 2021-11-13 NOTE — Progress Notes (Signed)
?  Echocardiogram ?2D Echocardiogram has been performed. ? ?Diana Mata ?11/13/2021, 11:55 AM ?

## 2021-11-13 NOTE — Progress Notes (Signed)
PROGRESS NOTE  Diana Mata YFV:[id number] DOB: 07-Feb-1931 DOA: 11/10/2021 PCP: Benita Stabile, MD  Brief History:  88 year old female with a history of paroxysmal atrial fibrillation, COPD, GERD, IBS, left bundle branch block, CKD stage III presenting with mechanical fall and left hip pain.  The patient was walking to her kitchen when she tripped and fell onto her left side.  She did not lose consciousness.  Ultimately, she was able to activate EMS.  At baseline, the patient is independent with all her ADLs and lives alone.  The patient had been in her usual state of health prior to her mechanical fall.  She denies fevers, chills, headache, chest pain, shortness of breath, coughing, hemoptysis, nausea, vomiting, diarrhea, abdominal pain, dysuria.   In the ED, the patient was afebrile hemodynamically stable with oxygen saturation 100% 2 L.  BMP showed sodium 134, potassium 3.8, bicarbonate 19, serum creatinine 1.04.  WBC 13.8, hemoglobin 12.0, platelets 237,000.  X-ray of the left hip showed a left subcapital femoral neck fracture.  Orthopedics was consulted and plans for operative repair on 11/13/2021. During evening 3/10-3/11, pt had paroxysms of PAF with RVR and given one dose IV lopressor.   Pt was asymptomatic.  Back in sinus rhythm at time of my eval 11/12/21 am.   Assessment and Plan: * Closed displaced fracture of left femoral neck (HCC) Left hip x-ray showed left subcapital femoral neck fracture Appreciate ortho, Dr. Dallas Schimke Last dose Xarelto on 11/10/21 at 10AM 3/11--Left hemiarthroplasty Start PT/OT D/c fentanyl IV Continue Norco 5/325>>increase to q 4 Restart Xarelto in next 24 hours if ok with ortho  PAF (paroxysmal atrial fibrillation) (HCC) Currently in sinus rhythm Having more paroxsyms of afib RVR Intermittent doses lopressor IV Increase diltiazem to q 6 hours Echo Restart Xarelto in next 24 hours if ok with ortho  Chronic kidney disease, stage 3a  (HCC) Baseline creatinine 0.8-1.1  Leukocytosis Secondary to stress demargination --no fever; hemodynamically stable  Fall Continue fall precaution and neurochecks Consider PT/OT eval and treat status post surgical repair of femoral neck fracture  Acute blood loss anemia Hgb dropped 12>>7.5 Due to hip fracture Partly dilution Recheck H/H later today FOBT Iron studies  Overactive bladder Continue gemtesa with therapeutic interchange  GERD (gastroesophageal reflux disease) Continue Protonix     Status is: Inpatient Remains inpatient appropriate because: severity of illness requiring operative management       Family Communication:   daughter updated 3/12   Consultants:  ortho--[place]   Code Status:  DNR   DVT Prophylaxis:  Xarelto restart     Procedures: As Listed in Progress Note Above   Antibiotics: None      Subjective: Patient denies fevers, chills, headache, chest pain, dyspnea, nausea, vomiting, diarrhea, abdominal pain, dysuria, hematuria, hematochezia, and melena.   Objective: Vitals:   11/12/21 2256 11/13/21 0545 11/13/21 0546 11/13/21 0958  BP: (!) 117/49 120/65 (!) 108/52 (!) 129/53  Pulse: (!) 101 91 (!) 106 78  Resp: 18 16 18 18   Temp: 99.1 F (37.3 C) (!) 97.5 F (36.4 C) (!) 97.5 F (36.4 C)   TempSrc: Oral  Oral   SpO2: 95% (!) 87% 92% 100%  Weight:      Height:        Intake/Output Summary (Last 24 hours) at 11/13/2021 1133 Last data filed at 11/13/2021 0900 Gross per 24 hour  Intake 3741.03 ml  Output 200 ml  Net 3541.03 ml  Weight change:  Exam:  General:  Pt is alert, follows commands appropriately, not in acute distress HEENT: No icterus, No thrush, No neck mass, Loving/AT Cardiovascular: RRR, S1/S2, no rubs, no gallops Respiratory: bibasilar rales. No wheeze Abdomen: Soft/+BS, non tender, non distended, no guarding Extremities: No edema, No lymphangitis, No petechiae, No rashes, no synovitis   Data Reviewed: I  have personally reviewed following labs and imaging studies Basic Metabolic Panel: Recent Labs  Lab 11/10/21 2119 11/11/21 0445 11/13/21 0412  NA 134* 135 135  K 3.8 3.8 3.7  CL 104 108 109  CO2 19* 19* 17*  GLUCOSE 105* 99 133*  BUN 23 21 22   CREATININE 1.04* 1.00 1.04*  CALCIUM 8.8* 8.2* 7.7*  MG  --  2.0 1.8  PHOS  --  3.5  --    Liver Function Tests: Recent Labs  Lab 11/10/21 2119 11/11/21 0445  AST 23 66*  ALT 20 43  ALKPHOS 77 78  BILITOT 0.5 0.5  PROT 7.3 5.8*  ALBUMIN 4.0 3.2*   No results for input(s): LIPASE, AMYLASE in the last 168 hours. No results for input(s): AMMONIA in the last 168 hours. Coagulation Profile: No results for input(s): INR, PROTIME in the last 168 hours. CBC: Recent Labs  Lab 11/10/21 2119 11/11/21 0445 11/12/21 1030 11/13/21 0412  WBC 13.8* 11.4*  --  19.2*  NEUTROABS 8.3*  --   --   --   HGB 12.0 9.6* 9.1* 7.5*  HCT 37.3 31.9* 29.3* 24.1*  MCV 87.4 88.6  --  88.0  PLT 337 263  --  220   Cardiac Enzymes: No results for input(s): CKTOTAL, CKMB, CKMBINDEX, TROPONINI in the last 168 hours. BNP: Invalid input(s): POCBNP CBG: No results for input(s): GLUCAP in the last 168 hours. HbA1C: No results for input(s): HGBA1C in the last 72 hours. Urine analysis:    Component Value Date/Time   COLORURINE YELLOW 11/11/2021 1755   APPEARANCEUR HAZY (A) 11/11/2021 1755   APPEARANCEUR Hazy (A) 11/16/2020 1342   LABSPEC 1.019 11/11/2021 1755   PHURINE 6.0 11/11/2021 1755   GLUCOSEU NEGATIVE 11/11/2021 1755   HGBUR SMALL (A) 11/11/2021 1755   BILIRUBINUR NEGATIVE 11/11/2021 1755   BILIRUBINUR Negative 11/16/2020 1342   KETONESUR 5 (A) 11/11/2021 1755   PROTEINUR NEGATIVE 11/11/2021 1755   UROBILINOGEN negative (A) 09/24/2019 1629   NITRITE NEGATIVE 11/11/2021 1755   LEUKOCYTESUR LARGE (A) 11/11/2021 1755   Sepsis Labs: @LABRCNTIP (procalcitonin:4,lacticidven:4) ) Recent Results (from the past 240 hour(s))  Resp Panel by RT-PCR  (Flu A&B, Covid) Nasopharyngeal Swab     Status: None   Collection Time: 11/10/21 11:19 PM   Specimen: Nasopharyngeal Swab; Nasopharyngeal(NP) swabs in vial transport medium  Result Value Ref Range Status   SARS Coronavirus 2 by RT PCR NEGATIVE NEGATIVE Final    Comment: (NOTE) SARS-CoV-2 target nucleic acids are NOT DETECTED.  The SARS-CoV-2 RNA is generally detectable in upper respiratory specimens during the acute phase of infection. The lowest concentration of SARS-CoV-2 viral copies this assay can detect is 138 copies/mL. A negative result does not preclude SARS-Cov-2 infection and should not be used as the sole basis for treatment or other patient management decisions. A negative result may occur with  improper specimen collection/handling, submission of specimen other than nasopharyngeal swab, presence of viral mutation(s) within the areas targeted by this assay, and inadequate number of viral copies(<138 copies/mL). A negative result must be combined with clinical observations, patient history, and epidemiological information. The expected result is  Negative.  Fact Sheet for Patients:  BloggerCourse.comhttps://www.fda.gov/media/152166/download  Fact Sheet for Healthcare Providers:  SeriousBroker.ithttps://www.fda.gov/media/152162/download  This test is no t yet approved or cleared by the [place] FDA and  has been authorized for detection and/or diagnosis of SARS-CoV-2 by FDA under an Emergency Use Authorization (EUA). This EUA will remain  in effect (meaning this test can be used) for the duration of the COVID-19 declaration under Section 564(b)(1) of the Act, 21 U.S.C.section 360bbb-3(b)(1), unless the authorization is terminated  or revoked sooner.       Influenza A by PCR NEGATIVE NEGATIVE Final   Influenza B by PCR NEGATIVE NEGATIVE Final    Comment: (NOTE) The Xpert Xpress SARS-CoV-2/FLU/RSV plus assay is intended as an aid in the diagnosis of influenza from Nasopharyngeal swab specimens  and should not be used as a sole basis for treatment. Nasal washings and aspirates are unacceptable for Xpert Xpress SARS-CoV-2/FLU/RSV testing.  Fact Sheet for Patients: BloggerCourse.comhttps://www.fda.gov/media/152166/download  Fact Sheet for Healthcare Providers: SeriousBroker.ithttps://www.fda.gov/media/152162/download  This test is not yet approved or cleared by the [place] FDA and has been authorized for detection and/or diagnosis of SARS-CoV-2 by FDA under an Emergency Use Authorization (EUA). This EUA will remain in effect (meaning this test can be used) for the duration of the COVID-19 declaration under Section 564(b)(1) of the Act, 21 U.S.C. section 360bbb-3(b)(1), unless the authorization is terminated or revoked.  Performed at [facility], [address] [place], [place] [address]   Surgical PCR screen     Status: Abnormal   Collection Time: 11/11/21  4:30 PM   Specimen: Nasal Mucosa; Nasal Swab  Result Value Ref Range Status   MRSA, PCR POSITIVE (A) NEGATIVE Final    Comment: RESULT CALLED TO, READ BACK BY AND VERIFIED WITH: THOMAS,C @ 2012 ON 11/11/21 BY JUW    Staphylococcus aureus POSITIVE (A) NEGATIVE Final    Comment: (NOTE) The Xpert SA Assay (FDA approved for NASAL specimens in patients 86 years of age and older), is one component of a comprehensive surveillance program. It is not intended to diagnose infection nor to guide or monitor treatment. Performed at [facility], [address] [place], [place] [address]      Scheduled Meds:  chlorhexidine  15 mL Mouth/Throat Once   Or   mouth rinse  15 mL Mouth Rinse Once   diltiazem  30 mg Oral Q6H   feeding supplement  237 mL Oral BID BM   mupirocin ointment  1 application. Nasal BID   pantoprazole  40 mg Oral Daily   Continuous Infusions:  sodium chloride 50 mL/hr at 11/13/21 [id number]    Procedures/Studies: DG Chest 1 View  Result Date: 11/10/2021 CLINICAL DATA:  Known left hip fracture, initial encounter EXAM: CHEST  1 VIEW  COMPARISON:  02/04/2021 FINDINGS: Cardiac shadow is within normal limits. Aortic calcifications are seen. Lungs are well aerated bilaterally. No acute infiltrate is seen. No bony abnormality is noted. IMPRESSION: No active disease. Electronically Signed   By: Alcide CleverMark  Lukens M.D.   On: 11/10/2021 23:23   CT Head Wo Contrast  Result Date: 11/10/2021 CLINICAL DATA:  Head trauma fall EXAM: CT HEAD WITHOUT CONTRAST TECHNIQUE: Contiguous axial images were obtained from the base of the skull through the vertex without intravenous contrast. RADIATION DOSE REDUCTION: This exam was performed according to the departmental dose-optimization program which includes automated exposure control, adjustment of the mA and/or kV according to patient size and/or use of iterative reconstruction technique. COMPARISON:  CT 06/30/2018, FINDINGS:  Brain: No acute territorial infarction,hemorrhage or intracranial mass. Moderate atrophy. Moderate chronic small vessel ischemic changes of the white matter. Stable ventricle size. Vascular: No hyperdense vessels.  Carotid vascular calcification. Skull: No fracture. 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 Sinuses/Orbits: No acute finding. Other: None IMPRESSION: 1. No CT evidence for acute intracranial abnormality. 2. Atrophy and chronic small vessel ischemic changes of the white matter. Electronically Signed   By: Jasmine Pang M.D.   On: 11/10/2021 23:28   DG HIP UNILAT WITH PELVIS 2-3 VIEWS LEFT  Result Date: 11/12/2021 CLINICAL DATA:  88 year old female status post left hip surgery. EXAM: DG HIP (WITH OR WITHOUT PELVIS) 2-3V LEFT COMPARISON:  11/10/2021. FINDINGS: New postoperative changes of left hip hemiarthroplasty are noted. The prosthetic femoral head is located in the acetabulum. Gas is present in the joint space and overlying soft tissues, and there are skin staples lateral to the joint space. Bony  pelvic ring appears intact. Right femur as visualized is intact. Right femoral head appears located on this single view examination. IMPRESSION: 1. Expected postoperative changes of left hip hemiarthroplasty without immediate acute complicating features, as above. Electronically Signed   By: Trudie Reed M.D.   On: 11/12/2021 11:22   DG Hip Unilat W or Wo Pelvis 2-3 Views Left  Result Date: 11/10/2021 CLINICAL DATA:  Recent fall with left hip pain, initial encounter EXAM: DG HIP (WITH OR WITHOUT PELVIS) 3V LEFT COMPARISON:  None. FINDINGS: Pelvic ring is intact. Subcapital femoral neck fracture is noted on the left with impaction and angulation at the fracture site. Right hip replacement is noted. No soft tissue abnormality is seen. IMPRESSION: Left subcapital femoral neck fracture. Electronically Signed   By: Alcide Clever M.D.   On: 11/10/2021 23:23    Catarina Hartshorn, DO  [facility]  If 7PM-7AM, please contact night-coverage [email or web address] Password [facility] 11/13/2021, 11:33 AM   LOS: 2 days

## 2021-11-14 DIAGNOSIS — N1831 Chronic kidney disease, stage 3a: Secondary | ICD-10-CM | POA: Diagnosis not present

## 2021-11-14 DIAGNOSIS — S72002A Fracture of unspecified part of neck of left femur, initial encounter for closed fracture: Secondary | ICD-10-CM | POA: Diagnosis not present

## 2021-11-14 DIAGNOSIS — D62 Acute posthemorrhagic anemia: Secondary | ICD-10-CM | POA: Diagnosis not present

## 2021-11-14 DIAGNOSIS — I480 Paroxysmal atrial fibrillation: Secondary | ICD-10-CM | POA: Diagnosis not present

## 2021-11-14 LAB — BASIC METABOLIC PANEL
Anion gap: 8 (ref 5–15)
BUN: 28 mg/dL — ABNORMAL HIGH (ref 8–23)
CO2: 19 mmol/L — ABNORMAL LOW (ref 22–32)
Calcium: 7.6 mg/dL — ABNORMAL LOW (ref 8.9–10.3)
Chloride: 107 mmol/L (ref 98–111)
Creatinine, Ser: 0.99 mg/dL (ref 0.44–1.00)
GFR, Estimated: 55 mL/min — ABNORMAL LOW (ref >60)
Glucose, Bld: 131 mg/dL — ABNORMAL HIGH (ref 70–99)
Potassium: 3.5 mmol/L (ref 3.5–5.1)
Sodium: 134 mmol/L — ABNORMAL LOW (ref 135–145)

## 2021-11-14 LAB — URINALYSIS, COMPLETE (UACMP) WITH MICROSCOPIC
Bilirubin Urine: NEGATIVE
Glucose, UA: NEGATIVE mg/dL
Ketones, ur: 5 mg/dL — AB
Nitrite: NEGATIVE
Protein, ur: 30 mg/dL — AB
Specific Gravity, Urine: 1.021 (ref 1.005–1.030)
WBC, UA: >50 WBC/hpf — ABNORMAL HIGH (ref 0–5)
pH: 5 (ref 5.0–8.0)

## 2021-11-14 LAB — TYPE AND SCREEN
ABO/RH(D): A POS
Antibody Screen: NEGATIVE
Unit division: 0
Unit division: 0

## 2021-11-14 LAB — CBC
HCT: 31.2 % — ABNORMAL LOW (ref 36.0–46.0)
Hemoglobin: 10.3 g/dL — ABNORMAL LOW (ref 12.0–15.0)
MCH: 28.5 pg (ref 26.0–34.0)
MCHC: 33 g/dL (ref 30.0–36.0)
MCV: 86.2 fL (ref 80.0–100.0)
Platelets: 191 10*3/uL (ref 150–400)
RBC: 3.62 MIL/uL — ABNORMAL LOW (ref 3.87–5.11)
RDW: 15 % (ref 11.5–15.5)
WBC: 13.9 10*3/uL — ABNORMAL HIGH (ref 4.0–10.5)
nRBC: 0 % (ref 0.0–0.2)

## 2021-11-14 LAB — BPAM RBC
Blood Product Expiration Date: 202304102359
Blood Product Expiration Date: 202304102359
ISSUE DATE / TIME: 202303121733
ISSUE DATE / TIME: 202303122057
Unit Type and Rh: 6200
Unit Type and Rh: 6200

## 2021-11-14 LAB — MAGNESIUM: Magnesium: 1.8 mg/dL (ref 1.7–2.4)

## 2021-11-14 MED ORDER — DILTIAZEM HCL ER COATED BEADS 120 MG PO CP24
120.0000 mg | ORAL_CAPSULE | Freq: Every day | ORAL | Status: DC
  Administered 2021-11-15: 120 mg via ORAL
  Filled 2021-11-14: qty 1

## 2021-11-14 MED ORDER — SODIUM CHLORIDE 0.9 % IV SOLN
250.0000 mg | Freq: Once | INTRAVENOUS | Status: AC
  Administered 2021-11-14: 250 mg via INTRAVENOUS
  Filled 2021-11-14: qty 20

## 2021-11-14 MED ORDER — METOPROLOL TARTRATE 5 MG/5ML IV SOLN
5.0000 mg | Freq: Once | INTRAVENOUS | Status: AC
  Administered 2021-11-14: 5 mg via INTRAVENOUS
  Filled 2021-11-14: qty 5

## 2021-11-14 MED ORDER — DILTIAZEM HCL 30 MG PO TABS
30.0000 mg | ORAL_TABLET | Freq: Four times a day (QID) | ORAL | Status: AC
  Administered 2021-11-14 – 2021-11-15 (×4): 30 mg via ORAL
  Filled 2021-11-14 (×4): qty 1

## 2021-11-14 NOTE — Progress Notes (Signed)
? ?

## 2021-11-14 NOTE — TOC Progression Note (Signed)
Transition of Care (TOC) - Progression Note  ? ? ?Patient Details  ?Name: Diana Mata ?MRN: [id number] ?Date of Birth: 08-Nov-1930 ? ?Transition of Care (TOC) CM/SW Contact  ?Elliot Gault, LCSW ?Phone Number: ?11/14/2021, 10:34 AM ? ?Clinical Narrative:    ? ?Pt has bed [facility] from [facility] and has accepted. Kerri at [facility] to start auth. MD anticipating pt will be medically stable for dc tomorrow. ? ?TOC will follow. ? ?Expected Discharge Plan: Skilled Nursing Facility ?Barriers to Discharge: Continued Medical Work up ? ?Expected Discharge Plan and Services ?Expected Discharge Plan: Skilled Nursing Facility ?In-house Referral: Clinical Social Work ?  ?Post Acute Care Choice: Skilled Nursing Facility ?Living arrangements for the past 2 months: Single Family Home ?                ?  ?  ?  ?  ?  ?  ?  ?  ?  ?  ? ? ?Social Determinants of Health (SDOH) Interventions ?  ? ?Readmission Risk Interventions ?No flowsheet data found. ? ?

## 2021-11-14 NOTE — Progress Notes (Signed)
Physical Therapy Treatment ?Patient Details ?Name: Diana Mata ?MRN: [id number] ?DOB: 01/12/1931 ?Today's Date: 11/14/2021 ? ? ?History of Present Illness Loey Halderman is a 88 y.o. female with medical history significant of  atrial flutter, COPD, atrial fibrillation, GERD who presented to the emergency department due to left hip pain.    She was found to have a hip fx and recieved a Lt Hemiarthroplasty on 3/11. ? ?  ?PT Comments  ? ? Patient demonstrates slow labored movement for sitting up at bedside with c/o severe pain left hip with movement, very unsteady on feet and limited to a few slow labored short side steps with difficulty advancing LLE due to weakness/pain with fair carryover for maintaining TDWB on left foot.  Patient limited for completing exercises with LLE due to pain.  Patient tolerated sitting up in chair after therapy with her daughter present in room.  Patient will benefit from continued skilled physical therapy in hospital and recommended venue below to increase strength, balance, endurance for safe ADLs and gait.  ? ?    ?Recommendations for follow up therapy are one component of a multi-disciplinary discharge planning process, led by the attending physician.  Recommendations may be updated based on patient status, additional functional criteria and insurance authorization. ? ?Follow Up Recommendations ? Skilled nursing-short term rehab (<3 hours/day) ?  ?  ?Assistance Recommended at Discharge    ?Patient can return home with the following A lot of help with bathing/dressing/bathroom;A lot of help with walking and/or transfers;Help with stairs or ramp for entrance;Assistance with cooking/housework ?  ?Equipment Recommendations ? None recommended by PT  ?  ?Recommendations for Other Services   ? ? ?  ?Precautions / Restrictions Precautions ?Precautions: Posterior Hip ?Precaution Booklet Issued: No ?Restrictions ?Weight Bearing Restrictions: Yes ?LLE Weight Bearing: Touchdown weight  bearing  ?  ? ?Mobility ? Bed Mobility ?  ?  ?  ?  ?  ?  ?  ?General bed mobility comments: as per OT notes ?  ? ?Transfers ?  ?  ?  ?  ?  ?  ?  ?  ?  ?General transfer comment: as per OT notes ?  ? ?Ambulation/Gait ?Ambulation/Gait assistance: Mod assist, Max assist ?Gait Distance (Feet): 5 Feet ?Assistive device: Rolling walker (2 wheels) ?Gait Pattern/deviations: Decreased step length - right, Decreased step length - left, Decreased stride length, Antalgic ?Gait velocity: decreased ?  ?  ?General Gait Details: limited to a few slow labored unsteady side steps with c/o increased pain left hip with movement, fair carryover for maintaining TDWB LLE ? ? ?Stairs ?  ?  ?  ?  ?  ? ? ?Wheelchair Mobility ?  ? ?Modified Rankin (Stroke Patients Only) ?  ? ? ?  ?Balance Overall balance assessment: Needs assistance ?Sitting-balance support: Feet supported, No upper extremity supported ?Sitting balance-Leahy Scale: Fair ?Sitting balance - Comments: seated at EOB ?  ?Standing balance support: Reliant on assistive device for balance, During functional activity, Bilateral upper extremity supported ?Standing balance-Leahy Scale: Poor ?Standing balance comment: fair/poor using RW ?  ?  ?  ?  ?  ?  ?  ?  ?  ?  ?  ?  ? ?  ?Cognition Arousal/Alertness: Awake/alert ?Behavior During Therapy: [facility] for tasks assessed/performed ?Overall Cognitive Status: Within Functional Limits for tasks assessed ?  ?  ?  ?  ?  ?  ?  ?  ?  ?  ?  ?  ?  ?  ?  ?  ?  ?  ?  ? ?  ?  Exercises General Exercises - Lower Extremity ?Ankle Circles/Pumps: Seated, AROM, Strengthening, Both, 10 reps ?Long Arc Quad: Seated, AROM, AAROM, Strengthening, 5 reps ? ?  ?General Comments   ?  ?  ? ?Pertinent Vitals/Pain Pain Assessment ?Pain Assessment: Faces ?Faces Pain Scale: Hurts even more ?Pain Location: left hip ?Pain Descriptors / Indicators: Discomfort, Grimacing, Guarding, Moaning ?Pain Intervention(s): Limited activity within patient's tolerance, Monitored during  session, Premedicated before session, Repositioned  ? ? ?Home Living Family/patient expects to be discharged to:: Private residence ?Living Arrangements: Alone ?Available Help at Discharge: Family;Available 24 hours/day ?Type of Home: House ?Home Access: Level entry ?  ?  ?  ?Home Layout: One level ?Home Equipment: Conservation officer, nature (2 wheels);Cane - single point ?   ?  ?Prior Function    ?  ?  ?   ? ?PT Goals (current goals can now be found in the care plan section) Progress towards PT goals: Progressing toward goals ? ?  ?Frequency ? ? ? Min 4X/week ? ? ? ?  ?PT Plan Current plan remains appropriate  ? ? ?Co-evaluation PT/OT/SLP Co-Evaluation/Treatment: Yes ?Reason for Co-Treatment: Complexity of the patient's impairments (multi-system involvement);To address functional/ADL transfers ?PT goals addressed during session: Mobility/safety with mobility;Balance;Proper use of DME;Strengthening/ROM ?OT goals addressed during session: ADL's and self-care ?  ? ?  ?AM-PAC PT "6 Clicks" Mobility   ?Outcome Measure ? Help needed turning from your back to your side while in a flat bed without using bedrails?: A Lot ?Help needed moving from lying on your back to sitting on the side of a flat bed without using bedrails?: A Lot ?Help needed moving to and from a bed to a chair (including a wheelchair)?: A Lot ?Help needed standing up from a chair using your arms (e.g., wheelchair or bedside chair)?: A Lot ?Help needed to walk in hospital room?: A Lot ?Help needed climbing 3-5 steps with a railing? : Total ?6 Click Score: 11 ? ?  ?End of Session   ?Activity Tolerance: Patient tolerated treatment well;Patient limited by fatigue ?Patient left: in chair;with call bell/phone within reach;with family/visitor present ?Nurse Communication: Mobility status ?PT Visit Diagnosis: Muscle weakness (generalized) (M62.81);History of falling (Z91.81);Difficulty in walking, not elsewhere classified (R26.2) ?  ? ? ?Time: [id number] ?PT Time Calculation  (min) (ACUTE ONLY): 18 min ? ?Charges:  $Therapeutic Activity: 8-22 mins          ?          ? ?11:48 AM, 11/14/21 ?Lonell Grandchild, MPT ?Physical Therapist with [place] ?[facility] ?[phone] office ?[phone] mobile phone ? ? ?

## 2021-11-14 NOTE — Progress Notes (Signed)
?   11/14/21 1346  ?Assess: MEWS Score  ?BP 116/74  ?Pulse Rate (!) 144  ?Assess: MEWS Score  ?MEWS Temp 0  ?MEWS Systolic 0  ?MEWS Pulse 3  ?MEWS RR 0  ?MEWS LOC 0  ?MEWS Score 3  ?MEWS Score Color Yellow  ?Assess: if the MEWS score is Yellow or Red  ?Were vital signs taken at a resting state? Yes  ?Focused Assessment No change from prior assessment  ?Early Detection of Sepsis Score *See Row Information* Low  ?MEWS guidelines implemented *See Row Information* Yes  ?Treat  ?MEWS Interventions Other (Comment) ?(MD made aware)  ?Pain Scale PAINAD  ?Pain Score 0  ?Take Vital Signs  ?Increase Vital Sign Frequency  Yellow: Q 2hr X 2 then Q 4hr X 2, if remains yellow, continue Q 4hrs  ?Escalate  ?MEWS: Escalate Yellow: discuss with charge nurse/RN and consider discussing with provider and RRT  ?Notify: Charge Nurse/RN  ?Name of Charge Nurse/RN Notified Chales Abrahams RN  ?Date Charge Nurse/RN Notified 11/14/21  ?Time Charge Nurse/RN Notified 1349  ?Notify: Provider  ?Provider Name/Title Dr Tat  ?Date Provider Notified 11/14/21  ?Time Provider Notified 1349  ?Notification Type Page ?(secure chat)  ?Notification Reason Other (Comment) ?(change in heart rate)  ?Provider response No new orders ?(at this time)  ? ? ?

## 2021-11-14 NOTE — Progress Notes (Signed)
?   11/14/21 1144  ?Assess: MEWS Score  ?BP 114/72  ?ECG Heart Rate (!) 120  ?Resp 18  ?Level of Consciousness Alert  ?SpO2 94 %  ?O2 Device Room Air  ?Assess: MEWS Score  ?MEWS Temp 0  ?MEWS Systolic 0  ?MEWS Pulse 2  ?MEWS RR 0  ?MEWS LOC 0  ?MEWS Score 2  ?MEWS Score Color Yellow  ?Treat  ?MEWS Interventions Administered scheduled meds/treatments  ?Pain Scale PAINAD  ?Pain Score 0  ?Take Vital Signs  ?Increase Vital Sign Frequency  Yellow: Q 2hr X 2 then Q 4hr X 2, if remains yellow, continue Q 4hrs  ?Escalate  ?MEWS: Escalate Yellow: discuss with charge nurse/RN and consider discussing with provider and RRT  ?Notify: Charge Nurse/RN  ?Name of Charge Nurse/RN Notified Margot Chimes RN  ?Date Charge Nurse/RN Notified 11/14/21  ?Time Charge Nurse/RN Notified 1144  ?Document  ?Patient Outcome Stabilized after interventions  ?Progress note created (see row info) Yes  ? ? ?

## 2021-11-14 NOTE — Evaluation (Signed)
Occupational Therapy Evaluation ?Patient Details ?Name: Diana Mata ?MRN: [id number] ?DOB: 12-Dec-1930 ?Today's Date: 11/14/2021 ? ? ?History of Present Illness Diana Mata is a 88 y.o. female with medical history significant of  atrial flutter, COPD, atrial fibrillation, GERD who presented to the emergency department due to left hip pain.    She was found to have a hip fx and recieved a Lt Hemiarthroplasty on 3/11.  ? ?Clinical Impression ?  ?Pt agreeable to OT/PT co-evaluation, daughter-Kathy, in room as well and assisting with PLOF information. Pt reports no pain while lying in bed, pain increasing with mobility. Pt requiring max assist for ADLs and mod assist with consistent cuing for mobility tasks. PTA pt living alone and independent in ADLs. Daughter reports she has supervision prn, will not have 24/7 supervision until pt's other daughter arrives in 2 weeks.  Recommend SNF on discharge to improve pt safety and independence with ADLs and functional mobility tasks. Will continue to see while in acute care.  ?   ? ?Recommendations for follow up therapy are one component of a multi-disciplinary discharge planning process, led by the attending physician.  Recommendations may be updated based on patient status, additional functional criteria and insurance authorization.  ? ?Follow Up Recommendations ? Skilled nursing-short term rehab (<3 hours/day)  ?  ?Assistance Recommended at Discharge Frequent or constant Supervision/Assistance  ?Patient can return home with the following Two people to help with walking and/or transfers;Two people to help with bathing/dressing/bathroom;Assistance with cooking/housework;Direct supervision/assist for medications management;Assist for transportation;Help with stairs or ramp for entrance ? ?  ?Functional Status Assessment ? Patient has had a recent decline in their functional status and demonstrates the ability to make significant improvements in function in a  reasonable and predictable amount of time.  ?Equipment Recommendations ? None recommended by OT  ?  ?   ?Precautions / Restrictions Precautions ?Precautions: Posterior Hip ?Restrictions ?Weight Bearing Restrictions: Yes ?LLE Weight Bearing: Touchdown weight bearing  ? ?  ? ?Mobility Bed Mobility ?Overal bed mobility: Needs Assistance ?Bed Mobility: Supine to Sit ?  ?  ?Supine to sit: Mod assist ?  ?  ?General bed mobility comments: Increased time for task, using BUE to assist ?  ? ?Transfers ?Overall transfer level: Needs assistance ?Equipment used: Rolling walker (2 wheels) ?Transfers: Sit to/from Stand, Bed to chair/wheelchair/BSC ?Sit to Stand: Mod assist ?Stand pivot transfers: Mod assist ?  ?  ?  ?  ?General transfer comment: cuing for weight bearing precautions ?  ? ?  ?   ? ?ADL either performed or assessed with clinical judgement  ? ?ADL Overall ADL's : Needs assistance/impaired ?Eating/Feeding: Supervision/ safety;Bed level ?  ?Grooming: Set up;Sitting ?Grooming Details (indicate cue type and reason): pt unable to stand for tasks, able to use BUE for tasks in sitting or bed level ?Upper Body Bathing: Moderate assistance;Sitting;Bed level ?  ?Lower Body Bathing: Maximal assistance;Sitting/lateral leans;Sit to/from stand;Bed level ?  ?Upper Body Dressing : Moderate assistance;Sitting ?Upper Body Dressing Details (indicate cue type and reason): Assist for gown management, use BUE for suppot while seated at EOB ?Lower Body Dressing: Maximal assistance;Bed level ?Lower Body Dressing Details (indicate cue type and reason): OT assisting with doffing personal socks and donning non-slip socks ?Toilet Transfer: Moderate assistance;Cueing for safety;Cueing for sequencing;Stand-pivot;Adhering to hip precautions;Rolling walker (2 wheels) ?Toilet Transfer Details (indicate cue type and reason): simulated with bed to chair transfer ?Toileting- Clothing Manipulation and Hygiene: Maximal assistance;Sit to/from stand ?  ?  ?   ?  ?  General ADL Comments: Pt requiring significant assistance with ADLs due to pain, LLE weakness, limiting standing tolerance and balance deficits  ? ? ? ?Vision Baseline Vision/History: 0 No visual deficits ?Ability to See in Adequate Light: 0 Adequate ?Patient Visual Report: No change from baseline ?Vision Assessment?: No apparent visual deficits  ?   ?   ?   ? ?Pertinent Vitals/Pain Pain Assessment ?Pain Assessment: Faces ?Faces Pain Scale: Hurts even more (with movement) ?Pain Location: left hip ?Pain Descriptors / Indicators: Discomfort, Grimacing, Guarding, Moaning ?Pain Intervention(s): Limited activity within patient's tolerance, Monitored during session, Premedicated before session, Repositioned  ? ? ? ?Hand Dominance Right ?  ?Extremity/Trunk Assessment Upper Extremity Assessment ?Upper Extremity Assessment: Generalized weakness ?  ?Lower Extremity Assessment ?Lower Extremity Assessment: Defer to PT evaluation ?  ?Cervical / Trunk Assessment ?Cervical / Trunk Assessment: Normal ?  ?Communication Communication ?Communication: HOH ?  ?Cognition Arousal/Alertness: Awake/alert ?Behavior During Therapy: [facility] for tasks assessed/performed ?Overall Cognitive Status: Within Functional Limits for tasks assessed ?  ?  ?  ?  ?  ?  ?  ?  ?  ?  ?  ?  ?  ?  ?  ?  ?  ?  ?  ?   ?   ?   ? ? ?Home Living Family/patient expects to be discharged to:: Private residence ?Living Arrangements: Alone ?Available Help at Discharge: Family;Available 24 hours/day ?Type of Home: House ?Home Access: Level entry ?  ?  ?Home Layout: One level ?  ?  ?Bathroom Shower/Tub: Walk-in shower ?  ?Bathroom Toilet: Handicapped height ?  ?  ?Home Equipment: Agricultural consultant (2 wheels);Cane - single point ?  ?  ?  ? ?  ?Prior Functioning/Environment Prior Level of Function : Independent/Modified Independent ?  ?  ?  ?  ?  ?  ?  ?  ?  ? ?  ?  ?OT Problem List: Decreased strength;Decreased range of motion;Decreased activity tolerance;Impaired balance  (sitting and/or standing);Decreased safety awareness;Decreased knowledge of use of DME or AE;Pain;Decreased knowledge of precautions ?  ?   ?OT Treatment/Interventions: Self-care/ADL training;Therapeutic exercise;DME and/or AE instruction;Therapeutic activities;Patient/family education  ?  ?OT Goals(Current goals can be found in the care plan section) Acute Rehab OT Goals ?Patient Stated Goal: To have less pain ?OT Goal Formulation: With patient ?Time For Goal Achievement: 11/28/21 ?Potential to Achieve Goals: Good  ?OT Frequency: Min 1X/week ?  ? ?Co-evaluation PT/OT/SLP Co-Evaluation/Treatment: Yes ?Reason for Co-Treatment: Complexity of the patient's impairments (multi-system involvement);To address functional/ADL transfers ?  ?OT goals addressed during session: ADL's and self-care ?  ? ?  ?   ?End of Session Equipment Utilized During Treatment: Rolling walker (2 wheels) ? ?Activity Tolerance: Patient tolerated treatment well ?Patient left: in chair;with call bell/phone within reach;with chair alarm set;with family/visitor present ? ?OT Visit Diagnosis: Muscle weakness (generalized) (M62.81);Pain ?Pain - Right/Left: Left ?Pain - part of body: Hip  ?              ?Time: 9476-5465 ?OT Time Calculation (min): 21 min ?Charges:  OT General Charges ?$OT Visit: 1 Visit ?OT Evaluation ?$OT Eval Low Complexity: 1 Low ? ? ? ?Ezra Sites, OTR/L  ?[phone] ?11/14/2021, 9:49 AM ?

## 2021-11-14 NOTE — Progress Notes (Signed)
ORTHOPAEDIC PROGRESS NOTE ?  ?s/p Procedure(s): ?Left hip hemiarthroplasty with placement of cerclage cable ?  ?DOS: 3/11/ ?  ?SUBJECTIVE: ?Resting comfortably.  Worked with PT.  Family reporting that confusion is improving.  Pain is controlled.  ?  ?OBJECTIVE: ?PE: ?  ?Sleeping.  Will awake to answer questions.  ?  ?Hip abduction pillow in place.  ?Toes are warm and well perfused ?Active motion EHL/TA ?Sensation intact to dorsum of the foot.  ?Dressing is clean, dry and intact ?  ?Vitals:  ? 11/14/21 0015 11/14/21 0552  ?BP: 127/68 (!) 116/46  ?Pulse: 72 96  ?Resp: 18 16  ?Temp: 98.4 ?F (36.9 ?C) 99 ?F (37.2 ?C)  ?SpO2: 100% 90%  ? ?CBC Latest Ref Rng & Units 11/14/2021 11/13/2021 11/13/2021  ?WBC 4.0 - 10.5 K/uL 13.9(H) - 19.2(H)  ?Hemoglobin 12.0 - 15.0 g/dL 10.3(L) 6.9(LL) 7.5(L)  ?Hematocrit 36.0 - 46.0 % 31.2(L) 21.6(L) 24.1(L)  ?Platelets 150 - 400 K/uL 191 - 220  ?  ?  ?ASSESSMENT: ?Diana Mata is a 88 y.o. female stable postoperatively.  s/p blood transfusion, appropriate response.   ?  ?PLAN: ?Weightbearing: TDWB LLE; ambulate with a walker at all times ?Insicional and dressing care: Reinforce dressings as needed ?Orthopedic device(s):  Hip abduction pillow at all times when in bed ?VTE prophylaxis: Xarelto to resume POD#1; or once safe to resume ?Pain control: As needed.  Judicious use of narcotics.  ?Follow - up plan: 2 weeks ?  ?  ?Contact information:   ?  ?  ?Hetal Proano A. Dallas Schimke, MD MS ?[facility] ?[address] ?[place],  [place]  [address] ?Phone: [phone] ?Fax: [phone] ?  ?

## 2021-11-14 NOTE — Progress Notes (Signed)
Patient's heart rate 140s to 150s, Lopressor 5mg  IV given per MD order.  Patient converted back to sinus rhythm in the 80s  and resting comfortably at this time. ?

## 2021-11-14 NOTE — Progress Notes (Signed)
PROGRESS NOTE  Diana Mata [id number] DOB: 10-16-1930 DOA: 11/10/2021 PCP: Celene Squibb, MD  Brief History:  88 year old female with a history of paroxysmal atrial fibrillation, COPD, GERD, IBS, left bundle branch block, CKD stage III presenting with mechanical fall and left hip pain.  The patient was walking to her kitchen when she tripped and fell onto her left side.  She did not lose consciousness.  Ultimately, she was able to activate EMS.  At baseline, the patient is independent with all her ADLs and lives alone.  The patient had been in her usual state of health prior to her mechanical fall.  She denies fevers, chills, headache, chest pain, shortness of breath, coughing, hemoptysis, nausea, vomiting, diarrhea, abdominal pain, dysuria.   In the ED, the patient was afebrile hemodynamically stable with oxygen saturation 100% 2 L.  BMP showed sodium 134, potassium 3.8, bicarbonate 19, serum creatinine 1.04.  WBC 13.8, hemoglobin 12.0, platelets 237,000.  X-ray of the left hip showed a left subcapital femoral neck fracture.  Orthopedics was consulted and plans for operative repair on 11/13/2021. During evening 3/10-3/11, pt had paroxysms of PAF with RVR and given one dose IV lopressor.   Pt was asymptomatic.  Back in sinus rhythm at time of my eval 11/12/21 am.  11/13/21--continued to have paroxysms of Afib RVR;  increased cardizem to 30 mg q6 hr.  Poor po intake.  A little confused this morning.  States pain is controlled.  Denies cp, sob, n/v/d.   Assessment/Plan:   Principal Problem:   Closed displaced fracture of left femoral neck (HCC) Active Problems:   PAF (paroxysmal atrial fibrillation) (HCC)   Chronic kidney disease, stage 3a (HCC)   Leukocytosis   Fall   GERD (gastroesophageal reflux disease)   Overactive bladder   Acute blood loss anemia  Assessment and Plan: * Closed displaced fracture of left femoral neck (HCC) Left hip x-ray showed left subcapital  femoral neck fracture Appreciate ortho, Dr. Amedeo Kinsman Last dose Xarelto on 11/10/21 at 10AM 3/11--Left hemiarthroplasty Start PT/OT>>SNF D/c fentanyl IV Continue Norco 5/325>>increase to q 4 Restart Xarelto in am if Hgb stable  PAF (paroxysmal atrial fibrillation) (HCC) Currently in sinus rhythm Having more paroxsyms of afib RVR Intermittent doses lopressor IV--last dose on 3/13 1429 Increase diltiazem to q 6 hours; distal inf HK 11/13/21 Echo EF 60-65%, trivival MR, AI, mod TR Restart Xarelto in am if Hgb remains stable Transition to cardizem cd 3/14  Chronic kidney disease, stage 3a ([place]) Baseline creatinine 0.8-1.1  Leukocytosis Secondary to stress demargination --no fever; hemodynamically stable  Fall Continue fall precaution and neurochecks Consider PT/OT eval and treat status post surgical repair of femoral neck fracture  Acute blood loss anemia Hgb dropped 12>>7.5 Due to hip fracture Partly dilution Transfused 2 units PRBC 3/12 FOBT Iron studies--iron sat 5, ferritin 107>>nulecit x 1  Overactive bladder Continue gemtesa with therapeutic interchange  GERD (gastroesophageal reflux disease) Continue Protonix   Status is: Inpatient Remains inpatient appropriate because: severity of illness requiring operative management       Family Communication:   daughter updated 3/12   Consultants:  ortho--[place]   Code Status:  DNR   DVT Prophylaxis:  Xarelto restart     Procedures: As Listed in Progress Note Above   Antibiotics: None              Subjective: Patient denies fevers, chills, headache, chest pain, dyspnea, nausea, vomiting, diarrhea, abdominal pain,  dysuria, hematuria,    Objective: Vitals:   11/14/21 1144 11/14/21 1346 11/14/21 1430 11/14/21 1500  BP: 114/72 116/74 122/66 112/65  Pulse:  (!) 144 (!) 156 80  Resp: 18  18 18   Temp:      TempSrc:      SpO2: 94%  91% 96%  Weight:      Height:        Intake/Output Summary (Last 24  hours) at 11/14/2021 1813 Last data filed at 11/14/2021 1300 Gross per 24 hour  Intake 1807.16 ml  Output 200 ml  Net 1607.16 ml   Weight change:  Exam:  General:  Pt is alert, follows commands appropriately, not in acute distress HEENT: No icterus, No thrush, No neck mass, [place]/AT Cardiovascular: RRR, S1/S2, no rubs, no gallops Respiratory: bibasilar rales.  No wheeze Abdomen: Soft/+BS, non tender, non distended, no guarding Extremities: No edema, No lymphangitis, No petechiae, No rashes, no synovitis   Data Reviewed: I have personally reviewed following labs and imaging studies Basic Metabolic Panel: Recent Labs  Lab 11/10/21 2119 11/11/21 0445 11/13/21 0412 11/14/21 0408  NA 134* 135 135 134*  K 3.8 3.8 3.7 3.5  CL 104 108 109 107  CO2 19* 19* 17* 19*  GLUCOSE 105* 99 133* 131*  BUN 23 21 22  28*  CREATININE 1.04* 1.00 1.04* 0.99  CALCIUM 8.8* 8.2* 7.7* 7.6*  MG  --  2.0 1.8 1.8  PHOS  --  3.5  --   --    Liver Function Tests: Recent Labs  Lab 11/10/21 2119 11/11/21 0445  AST 23 66*  ALT 20 43  ALKPHOS 77 78  BILITOT 0.5 0.5  PROT 7.3 5.8*  ALBUMIN 4.0 3.2*   No results for input(s): LIPASE, AMYLASE in the last 168 hours. No results for input(s): AMMONIA in the last 168 hours. Coagulation Profile: No results for input(s): INR, PROTIME in the last 168 hours. CBC: Recent Labs  Lab 11/10/21 2119 11/11/21 0445 11/12/21 1030 11/13/21 0412 11/13/21 1357 11/14/21 0408  WBC 13.8* 11.4*  --  19.2*  --  13.9*  NEUTROABS 8.3*  --   --   --   --   --   HGB 12.0 9.6* 9.1* 7.5* 6.9* 10.3*  HCT 37.3 31.9* 29.3* 24.1* 21.6* 31.2*  MCV 87.4 88.6  --  88.0  --  86.2  PLT 337 263  --  220  --  191   Cardiac Enzymes: No results for input(s): CKTOTAL, CKMB, CKMBINDEX, TROPONINI in the last 168 hours. BNP: Invalid input(s): POCBNP CBG: No results for input(s): GLUCAP in the last 168 hours. HbA1C: No results for input(s): HGBA1C in the last 72 hours. Urine  analysis:    Component Value Date/Time   COLORURINE YELLOW 11/14/2021 0028   APPEARANCEUR CLOUDY (A) 11/14/2021 0028   APPEARANCEUR Hazy (A) 11/16/2020 1342   LABSPEC 1.021 11/14/2021 0028   PHURINE 5.0 11/14/2021 0028   GLUCOSEU NEGATIVE 11/14/2021 0028   HGBUR SMALL (A) 11/14/2021 0028   BILIRUBINUR NEGATIVE 11/14/2021 0028   BILIRUBINUR Negative 11/16/2020 1342   KETONESUR 5 (A) 11/14/2021 0028   PROTEINUR 30 (A) 11/14/2021 0028   UROBILINOGEN negative (A) 09/24/2019 1629   NITRITE NEGATIVE 11/14/2021 0028   LEUKOCYTESUR LARGE (A) 11/14/2021 0028   Sepsis Labs: @LABRCNTIP (procalcitonin:4,lacticidven:4) ) Recent Results (from the past 240 hour(s))  Resp Panel by RT-PCR (Flu A&B, Covid) Nasopharyngeal Swab     Status: None   Collection Time: 11/10/21 11:19 PM   Specimen: Nasopharyngeal  Swab; Nasopharyngeal(NP) swabs in vial transport medium  Result Value Ref Range Status   SARS Coronavirus 2 by RT PCR NEGATIVE NEGATIVE Final    Comment: (NOTE) SARS-CoV-2 target nucleic acids are NOT DETECTED.  The SARS-CoV-2 RNA is generally detectable in upper respiratory specimens during the acute phase of infection. The lowest concentration of SARS-CoV-2 viral copies this assay can detect is 138 copies/mL. A negative result does not preclude SARS-Cov-2 infection and should not be used as the sole basis for treatment or other patient management decisions. A negative result may occur with  improper specimen collection/handling, submission of specimen other than nasopharyngeal swab, presence of viral mutation(s) within the areas targeted by this assay, and inadequate number of viral copies(<138 copies/mL). A negative result must be combined with clinical observations, patient history, and epidemiological information. The expected result is Negative.  Fact Sheet for Patients:  EntrepreneurPulse.com.au  Fact Sheet for Healthcare Providers:   IncredibleEmployment.be  This test is no t yet approved or cleared by the [place] FDA and  has been authorized for detection and/or diagnosis of SARS-CoV-2 by FDA under an Emergency Use Authorization (EUA). This EUA will remain  in effect (meaning this test can be used) for the duration of the COVID-19 declaration under Section 564(b)(1) of the Act, 21 U.S.C.section 360bbb-3(b)(1), unless the authorization is terminated  or revoked sooner.       Influenza A by PCR NEGATIVE NEGATIVE Final   Influenza B by PCR NEGATIVE NEGATIVE Final    Comment: (NOTE) The Xpert Xpress SARS-CoV-2/FLU/RSV plus assay is intended as an aid in the diagnosis of influenza from Nasopharyngeal swab specimens and should not be used as a sole basis for treatment. Nasal washings and aspirates are unacceptable for Xpert Xpress SARS-CoV-2/FLU/RSV testing.  Fact Sheet for Patients: EntrepreneurPulse.com.au  Fact Sheet for Healthcare Providers: IncredibleEmployment.be  This test is not yet approved or cleared by the [place] FDA and has been authorized for detection and/or diagnosis of SARS-CoV-2 by FDA under an Emergency Use Authorization (EUA). This EUA will remain in effect (meaning this test can be used) for the duration of the COVID-19 declaration under Section 564(b)(1) of the Act, 21 U.S.C. section 360bbb-3(b)(1), unless the authorization is terminated or revoked.  Performed at [facility], [address] [place], [place] [address]   Surgical PCR screen     Status: Abnormal   Collection Time: 11/11/21  4:30 PM   Specimen: Nasal Mucosa; Nasal Swab  Result Value Ref Range Status   MRSA, PCR POSITIVE (A) NEGATIVE Final    Comment: RESULT CALLED TO, READ BACK BY AND VERIFIED WITH: THOMAS,C @ 2012 ON 11/11/21 BY JUW    Staphylococcus aureus POSITIVE (A) NEGATIVE Final    Comment: (NOTE) The Xpert SA Assay (FDA approved for NASAL  specimens in patients 48 years of age and older), is one component of a comprehensive surveillance program. It is not intended to diagnose infection nor to guide or monitor treatment. Performed at [facility], [address], [place],  [address]      Scheduled Meds:  chlorhexidine  15 mL Mouth/Throat Once   Or   mouth rinse  15 mL Mouth Rinse Once   [START ON 11/15/2021] diltiazem  120 mg Oral Daily   diltiazem  30 mg Oral Q6H   feeding supplement  237 mL Oral BID BM   mupirocin ointment  1 application. Nasal BID   pantoprazole  40 mg Oral Daily   traZODone  25 mg Oral QHS  Continuous Infusions:  sodium chloride 50 mL/hr at 11/14/21 0024    Procedures/Studies: DG Chest 1 View  Result Date: 11/10/2021 CLINICAL DATA:  Known left hip fracture, initial encounter EXAM: CHEST  1 VIEW COMPARISON:  02/04/2021 FINDINGS: Cardiac shadow is within normal limits. Aortic calcifications are seen. Lungs are well aerated bilaterally. No acute infiltrate is seen. No bony abnormality is noted. IMPRESSION: No active disease. Electronically Signed   By: Alcide CleverMark  Lukens M.D.   On: 11/10/2021 23:23   CT Head Wo Contrast  Result Date: 11/10/2021 CLINICAL DATA:  Head trauma fall EXAM: CT HEAD WITHOUT CONTRAST TECHNIQUE: Contiguous axial images were obtained from the base of the skull through the vertex without intravenous contrast. RADIATION DOSE REDUCTION: This exam was performed according to the departmental dose-optimization program which includes automated exposure control, adjustment of the mA and/or kV according to patient size and/or use of iterative reconstruction technique. COMPARISON:  CT 06/30/2018, FINDINGS: Brain: No acute territorial infarction,hemorrhage or intracranial mass. Moderate atrophy. Moderate chronic small vessel ischemic changes of the white matter. Stable ventricle size. Vascular: No hyperdense vessels.  Carotid vascular calcification. Skull: No fracture. 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 Sinuses/Orbits: No acute finding. Other: None IMPRESSION: 1. No CT evidence for acute intracranial abnormality. 2. Atrophy and chronic small vessel ischemic changes of the white matter. Electronically Signed   By: Jasmine PangKim  Fujinaga M.D.   On: 11/10/2021 23:28   ECHOCARDIOGRAM COMPLETE  Result Date: 11/13/2021    ECHOCARDIOGRAM REPORT   [facility] Date of Exam: 11/13/2021 Medical Rec #:  [id number]           Height:       64.0 in Accession #:    [id number]          Weight:       119.0 lb Date of Birth:  1931/07/22          BSA:          1.569 m Patient Age:    89 years            BP:           108/52 mmHg Patient Gender: F                   HR:           93 bpm. [facility] Procedure: 2D Echo, Color Doppler and Cardiac Doppler Indications:    I48.91* Unspeicified atrial fibrillation  History:        Patient has prior history of Echocardiogram examinations, most                 recent 06/07/2018. Abnormal ECG, COPD, Arrythmias:Atrial Flutter                 and Atrial Fibrillation; Signs/Symptoms:Chest Pain and                 Hypotension.  Sonographer:    Sheralyn Boatmanina West RDCS Referring Phys: [phone] Kaari Zeigler  Sonographer Comments: Technically difficult study due to poor echo windows. Difficult study. Patient had recent hip surgery, could not turn. IMPRESSIONS  1. Poor acoustic windows and frequent ectopy make accurate evaluation of LVEF difficult. OVerall LVEF appears normal with mild distal inferior hypokinesis. . Left ventricular ejection fraction, by estimation, is 60 to 65%. The left ventricle has normal function.  2. Right ventricular systolic function is  low normal. The right ventricular size is normal. There is severely elevated pulmonary artery systolic pressure.  3. The mitral valve is normal in structure. Trivial mitral valve regurgitation.  4. Tricuspid valve regurgitation is  moderate.  5. The aortic valve is tricuspid. Aortic valve regurgitation is mild.  6. The inferior vena cava is normal in size with greater than 50% respiratory variability, suggesting right atrial pressure of 3 mmHg. FINDINGS  Left Ventricle: Poor acoustic windows and frequent ectopy make accurate evaluation of LVEF difficult. OVerall LVEF appears normal with mild distal inferior hypokinesis. Left ventricular ejection fraction, by estimation, is 60 to 65%. The left ventricle has normal function. The left ventricular internal cavity size was small. There is no left ventricular hypertrophy. Right Ventricle: The right ventricular size is normal. No increase in right ventricular wall thickness. Right ventricular systolic function is low normal. There is severely elevated pulmonary artery systolic pressure. The tricuspid regurgitant velocity is 3.40 m/s, and with an assumed right atrial pressure of 15 mmHg, the estimated right ventricular systolic pressure is 0000000 mmHg. Left Atrium: Left atrial size was normal in size. Right Atrium: Right atrial size was normal in size. Pericardium: There is no evidence of pericardial effusion. Mitral Valve: The mitral valve is normal in structure. Trivial mitral valve regurgitation. Tricuspid Valve: The tricuspid valve is normal in structure. Tricuspid valve regurgitation is moderate. Aortic Valve: The aortic valve is tricuspid. Aortic valve regurgitation is mild. Pulmonic Valve: The pulmonic valve was normal in structure. Pulmonic valve regurgitation is mild. Aorta: The aortic root and ascending aorta are structurally normal, with no evidence of dilitation. Venous: The inferior vena cava is normal in size with greater than 50% respiratory variability, suggesting right atrial pressure of 3 mmHg. IAS/Shunts: No atrial level shunt detected by color flow Doppler.  LEFT VENTRICLE PLAX 2D LVIDd:         3.20 cm LVIDs:         2.10 cm LV PW:         1.10 cm LV IVS:        1.00 cm LVOT diam:      2.10 cm LV SV:         59 LV SV Index:   37 LVOT Area:     3.46 cm  LV Volumes (MOD) LV vol d, MOD A2C: 40.7 ml LV vol d, MOD A4C: 38.9 ml LV vol s, MOD A2C: 13.3 ml LV vol s, MOD A4C: 16.7 ml LV SV MOD A2C:     27.4 ml LV SV MOD A4C:     38.9 ml LV SV MOD BP:      25.2 ml RIGHT VENTRICLE             IVC RV S prime:     10.40 cm/s  IVC diam: 2.10 cm TAPSE (M-mode): 1.6 cm LEFT ATRIUM             Index        RIGHT ATRIUM           Index LA diam:        2.50 cm 1.59 cm/m   RA Area:     12.80 cm LA Vol (A2C):   18.6 ml 11.85 ml/m  RA Volume:   28.10 ml  17.91 ml/m LA Vol (A4C):   21.7 ml 13.83 ml/m LA Biplane Vol: 20.1 ml 12.81 ml/m  AORTIC VALVE             PULMONIC VALVE LVOT  Vmax:   121.00 cm/s PR End Diast Vel: 2.20 msec LVOT Vmean:  76.400 cm/s LVOT VTI:    0.169 m  AORTA Ao Root diam: 3.20 cm Ao Asc diam:  3.65 cm MITRAL VALVE               TRICUSPID VALVE MV Area (PHT): 4.77 cm    TR Peak grad:   46.2 mmHg MV Decel Time: 159 msec    TR Vmax:        340.00 cm/s MV E velocity: 99.88 cm/s MV A velocity: 73.72 cm/s  SHUNTS MV E/A ratio:  1.35        Systemic VTI:  0.17 m                            Systemic Diam: 2.10 cm Dorris Carnes MD Electronically signed by Dorris Carnes MD Signature Date/Time: 11/13/2021/1:43:28 PM    Final    DG HIP UNILAT WITH PELVIS 2-3 VIEWS LEFT  Result Date: 11/12/2021 CLINICAL DATA:  88 year old female status post left hip surgery. EXAM: DG HIP (WITH OR WITHOUT PELVIS) 2-3V LEFT COMPARISON:  11/10/2021. FINDINGS: New postoperative changes of left hip hemiarthroplasty are noted. The prosthetic femoral head is located in the acetabulum. Gas is present in the joint space and overlying soft tissues, and there are skin staples lateral to the joint space. Bony pelvic ring appears intact. Right femur as visualized is intact. Right femoral head appears located on this single view examination. IMPRESSION: 1. Expected postoperative changes of left hip hemiarthroplasty without immediate  acute complicating features, as above. Electronically Signed   By: Vinnie Langton M.D.   On: 11/12/2021 11:22   DG Hip Unilat W or Wo Pelvis 2-3 Views Left  Result Date: 11/10/2021 CLINICAL DATA:  Recent fall with left hip pain, initial encounter EXAM: DG HIP (WITH OR WITHOUT PELVIS) 3V LEFT COMPARISON:  None. FINDINGS: Pelvic ring is intact. Subcapital femoral neck fracture is noted on the left with impaction and angulation at the fracture site. Right hip replacement is noted. No soft tissue abnormality is seen. IMPRESSION: Left subcapital femoral neck fracture. Electronically Signed   By: Inez Catalina M.D.   On: 11/10/2021 23:23    Orson Eva, DO  [facility]  If 7PM-7AM, please contact night-coverage [email or web address] Password [facility] 11/14/2021, 6:13 PM   LOS: 3 days

## 2021-11-14 NOTE — Progress Notes (Addendum)
Progress Note  Patient Name: Tavie Ohlsen Date of Encounter: 11/14/2021  [facility] Cardiologist: Carlyle Dolly, MD   Subjective   Patient is back in Afib with relatively controlled rates. She is asymptomatic. Hgb improved after PRBCs. Denies significant left leg pain.   Inpatient Medications    Scheduled Meds:  chlorhexidine  15 mL Mouth/Throat Once   Or   mouth rinse  15 mL Mouth Rinse Once   diltiazem  30 mg Oral Q6H   feeding supplement  237 mL Oral BID BM   mupirocin ointment  1 application. Nasal BID   pantoprazole  40 mg Oral Daily   traZODone  25 mg Oral QHS   Continuous Infusions:  sodium chloride 50 mL/hr at 11/14/21 0024   PRN Meds: HYDROcodone-acetaminophen, ondansetron (ZOFRAN) IV   Vital Signs    Vitals:   11/13/21 2052 11/13/21 2119 11/14/21 0015 11/14/21 0552  BP: (!) 122/46 (!) 110/55 127/68 (!) 116/46  Pulse: (!) 103 (!) 101 72 96  Resp: 16 18 18 16   Temp: 98.1 F (36.7 C) 98.4 F (36.9 C) 98.4 F (36.9 C) 99 F (37.2 C)  TempSrc:  Oral  Oral  SpO2: 95% 98% 100% 90%  Weight:      Height:        Intake/Output Summary (Last 24 hours) at 11/14/2021 0802 Last data filed at 11/14/2021 0500 Gross per 24 hour  Intake 1867.16 ml  Output 200 ml  Net 1667.16 ml   Last 3 Weights 11/11/2021 03/15/2021 02/04/2021  Weight (lbs) 119 lb 0.8 oz 123 lb 125 lb  Weight (kg) 54 kg 55.792 kg 56.7 kg      Telemetry    Afib HR 90-100 - Personally Reviewed  ECG    No new - Personally Reviewed  Physical Exam   GEN: No acute distress.   Neck: No JVD Cardiac: Irreg Irreg, no murmurs, rubs, or gallops.  Respiratory: Clear to auscultation bilaterally. GI: Soft, nontender, non-distended  MS: No edema; No deformity. Neuro:  Nonfocal  Psych: Normal affect   Labs    High Sensitivity Troponin:  No results for input(s): TROPONINIHS in the last 720 hours.   Chemistry Recent Labs  Lab 11/10/21 2119 11/11/21 0445 11/13/21 0412 11/14/21 0408   NA 134* 135 135 134*  K 3.8 3.8 3.7 3.5  CL 104 108 109 107  CO2 19* 19* 17* 19*  GLUCOSE 105* 99 133* 131*  BUN 23 21 22  28*  CREATININE 1.04* 1.00 1.04* 0.99  CALCIUM 8.8* 8.2* 7.7* 7.6*  MG  --  2.0 1.8 1.8  PROT 7.3 5.8*  --   --   ALBUMIN 4.0 3.2*  --   --   AST 23 66*  --   --   ALT 20 43  --   --   ALKPHOS 77 78  --   --   BILITOT 0.5 0.5  --   --   GFRNONAA 52* 54* 52* 55*  ANIONGAP 11 8 9 8     Lipids No results for input(s): CHOL, TRIG, HDL, LABVLDL, LDLCALC, CHOLHDL in the last 168 hours.  Hematology Recent Labs  Lab 11/11/21 0445 11/12/21 1030 11/13/21 0412 11/13/21 1357 11/14/21 0408  WBC 11.4*  --  19.2*  --  13.9*  RBC 3.60*  --  2.74*  --  3.62*  HGB 9.6*   < > 7.5* 6.9* 10.3*  HCT 31.9*   < > 24.1* 21.6* 31.2*  MCV 88.6  --  88.0  --  86.2  MCH 26.7  --  27.4  --  28.5  MCHC 30.1  --  31.1  --  33.0  RDW 15.5  --  15.6*  --  15.0  PLT 263  --  220  --  191   < > = values in this interval not displayed.   Thyroid No results for input(s): TSH, FREET4 in the last 168 hours.  BNPNo results for input(s): BNP, PROBNP in the last 168 hours.  DDimer No results for input(s): DDIMER in the last 168 hours.   Radiology    ECHOCARDIOGRAM COMPLETE  Result Date: 11/13/2021    ECHOCARDIOGRAM REPORT   Patient Name:   Operating Room ServicesJONITA CHEEK-EURICH Date of Exam: 11/13/2021 Medical Rec #:  [id number]           Height:       64.0 in Accession #:    [id number]          Weight:       119.0 lb Date of Birth:  1930-11-16          BSA:          1.569 m Patient Age:    88 years            BP:           108/52 mmHg Patient Gender: F                   HR:           93 bpm. [facility] Procedure: 2D Echo, Color Doppler and Cardiac Doppler Indications:    I48.91* Unspeicified atrial fibrillation  History:        Patient has prior history of Echocardiogram examinations, most                 recent 06/07/2018. Abnormal ECG, COPD, Arrythmias:Atrial Flutter                 and Atrial  Fibrillation; Signs/Symptoms:Chest Pain and                 Hypotension.  Sonographer:    Sheralyn Boatmanina West RDCS Referring Phys: [phone] DAVID TAT  Sonographer Comments: Technically difficult study due to poor echo windows. Difficult study. Patient had recent hip surgery, could not turn. IMPRESSIONS  1. Poor acoustic windows and frequent ectopy make accurate evaluation of LVEF difficult. OVerall LVEF appears normal with mild distal inferior hypokinesis. . Left ventricular ejection fraction, by estimation, is 60 to 65%. The left ventricle has normal function.  2. Right ventricular systolic function is low normal. The right ventricular size is normal. There is severely elevated pulmonary artery systolic pressure.  3. The mitral valve is normal in structure. Trivial mitral valve regurgitation.  4. Tricuspid valve regurgitation is moderate.  5. The aortic valve is tricuspid. Aortic valve regurgitation is mild.  6. The inferior vena cava is normal in size with greater than 50% respiratory variability, suggesting right atrial pressure of 3 mmHg. FINDINGS  Left Ventricle: Poor acoustic windows and frequent ectopy make accurate evaluation of LVEF difficult. OVerall LVEF appears normal with mild distal inferior hypokinesis. Left ventricular ejection fraction, by estimation, is 60 to 65%. The left ventricle has normal function. The left ventricular internal cavity size was small. There is no left ventricular hypertrophy. Right Ventricle: The right ventricular size is normal. No increase in right ventricular wall thickness. Right ventricular systolic function is low normal. There is severely elevated pulmonary artery systolic  pressure. The tricuspid regurgitant velocity is 3.40 m/s, and with an assumed right atrial pressure of 15 mmHg, the estimated right ventricular systolic pressure is 0000000 mmHg. Left Atrium: Left atrial size was normal in size. Right Atrium: Right atrial size was normal in size. Pericardium: There is no evidence of  pericardial effusion. Mitral Valve: The mitral valve is normal in structure. Trivial mitral valve regurgitation. Tricuspid Valve: The tricuspid valve is normal in structure. Tricuspid valve regurgitation is moderate. Aortic Valve: The aortic valve is tricuspid. Aortic valve regurgitation is mild. Pulmonic Valve: The pulmonic valve was normal in structure. Pulmonic valve regurgitation is mild. Aorta: The aortic root and ascending aorta are structurally normal, with no evidence of dilitation. Venous: The inferior vena cava is normal in size with greater than 50% respiratory variability, suggesting right atrial pressure of 3 mmHg. IAS/Shunts: No atrial level shunt detected by color flow Doppler.  LEFT VENTRICLE PLAX 2D LVIDd:         3.20 cm LVIDs:         2.10 cm LV PW:         1.10 cm LV IVS:        1.00 cm LVOT diam:     2.10 cm LV SV:         59 LV SV Index:   37 LVOT Area:     3.46 cm  LV Volumes (MOD) LV vol d, MOD A2C: 40.7 ml LV vol d, MOD A4C: 38.9 ml LV vol s, MOD A2C: 13.3 ml LV vol s, MOD A4C: 16.7 ml LV SV MOD A2C:     27.4 ml LV SV MOD A4C:     38.9 ml LV SV MOD BP:      25.2 ml RIGHT VENTRICLE             IVC RV S prime:     10.40 cm/s  IVC diam: 2.10 cm TAPSE (M-mode): 1.6 cm LEFT ATRIUM             Index        RIGHT ATRIUM           Index LA diam:        2.50 cm 1.59 cm/m   RA Area:     12.80 cm LA Vol (A2C):   18.6 ml 11.85 ml/m  RA Volume:   28.10 ml  17.91 ml/m LA Vol (A4C):   21.7 ml 13.83 ml/m LA Biplane Vol: 20.1 ml 12.81 ml/m  AORTIC VALVE             PULMONIC VALVE LVOT Vmax:   121.00 cm/s PR End Diast Vel: 2.20 msec LVOT Vmean:  76.400 cm/s LVOT VTI:    0.169 m  AORTA Ao Root diam: 3.20 cm Ao Asc diam:  3.65 cm MITRAL VALVE               TRICUSPID VALVE MV Area (PHT): 4.77 cm    TR Peak grad:   46.2 mmHg MV Decel Time: 159 msec    TR Vmax:        340.00 cm/s MV E velocity: 99.88 cm/s MV A velocity: 73.72 cm/s  SHUNTS MV E/A ratio:  1.35        Systemic VTI:  0.17 m                             Systemic Diam: 2.10 cm Dorris Carnes MD Electronically signed by Nevin Bloodgood  Harrington Challenger MD Signature Date/Time: 11/13/2021/1:43:28 PM    Final    DG HIP UNILAT WITH PELVIS 2-3 VIEWS LEFT  Result Date: 11/12/2021 CLINICAL DATA:  88 year old female status post left hip surgery. EXAM: DG HIP (WITH OR WITHOUT PELVIS) 2-3V LEFT COMPARISON:  11/10/2021. FINDINGS: New postoperative changes of left hip hemiarthroplasty are noted. The prosthetic femoral head is located in the acetabulum. Gas is present in the joint space and overlying soft tissues, and there are skin staples lateral to the joint space. Bony pelvic ring appears intact. Right femur as visualized is intact. Right femoral head appears located on this single view examination. IMPRESSION: 1. Expected postoperative changes of left hip hemiarthroplasty without immediate acute complicating features, as above. Electronically Signed   By: Vinnie Langton M.D.   On: 11/12/2021 11:22    Cardiac Studies     Echocardiogram: 06/07/2018 Study Conclusions   - Left ventricle: The cavity size was normal. Wall thickness was    increased in a pattern of mild LVH. Systolic function was normal.    The estimated ejection fraction was in the range of 60% to 65%.    Wall motion was normal; there were no regional wall motion    abnormalities. Left ventricular diastolic function parameters    were normal.  - Aortic valve: Mildly calcified annulus. Trileaflet; normal    thickness leaflets. There was mild regurgitation. Valve area    (VTI): 2.38 cm^2. Valve area (Vmax): 2.1 cm^2.  - Mitral valve: There was mild regurgitation.  - Atrial septum: No defect or patent foramen ovale was identified.  - Pulmonary arteries: PA peak pressure: 36 mm Hg (S).  - Technically adequate study.     Patient Profile     88 y.o. female with a hx of paroxysmal atrial fibrillation/flutter (on Xarelto), chronic LBBB, HTN, COPD and Stage 3 CKD who is being seen 11/11/2021 for the evaluation of  preoperative cardiac clearance for left hip hemiarthroplasty  Assessment & Plan    Pre-op Paroxysmal Aflutter/fib - Xarelto held for L hip arthoplasty which was performed 3/11, Hgb 6.9 POD1 and she was given 2 units PRBCs - Hgb today 10.3, considering restarting a/c today, especially since she is in Afib. - she is asymptomatic - diltiazem 30mg  Q6H for rate control - PTA diltiazem 30mg  BID - Echo 2019 showed calcified annulus of er aortic valve - plan to repeat echo   LBBB - this is chronic, no plan for further ischemic testing.   HTN - BP normal - continue ditiazem  Acute Anemia - Hgb down to 6.9 s/p 2 units PRBCs - Hgb 10.3 today - Restart Xarelto as able   For questions or updates, please contact [facility] Please consult [email or web address] for contact info under        Signed, Cadence Ninfa Meeker, PA-C  11/14/2021, 8:02 AM    Patient seen and examined and agree with Cadence Kathlen Mody, PA-C as detailed above.  In brief, the patient is a 88 year old female with history of pAfib/flutter on xarelto, chronic LBBB, HTN, COPD and Stage 3 CKD who presented with fall found to have left hip fracture. Cardiology was consulted for preop clearance.  Patient underwent successful THA on 11/12/21. Had post-op anemia requiring 2u pRBC. Xarelto has been held for procedure and post-op. HR remain well controlled on dilt 30mg  q6H.   TTE with LVEF 60-65% with mild distal inferior hypokinesis, moderate TR, mild AI.   GEN: No acute distress. Elderly female  Neck: No JVD Cardiac:  Irregular, 2/6 systolic murmur Respiratory: Clear to auscultation bilaterally. GI: Soft, nontender, non-distended  MS: LLE brace in place, trace edema, warm Neuro:  Nonfocal  Psych: Normal affect    Plan: -Transition to dilt 120mg  ER tomorrow -Resume xarelto once cleared by surgery -Post-op care per ortho and primary -Will arrange follow-up in CV clinic  Cardiology will sign-off.   Gwyndolyn Kaufman, MD

## 2021-11-14 NOTE — Plan of Care (Signed)
?  Problem: Acute Rehab OT Goals (only OT should resolve) ?Goal: Pt. Will Perform Eating ?Flowsheets (Taken 11/14/2021 0955) ?Pt Will Perform Eating: ? with modified independence ? sitting ?Goal: Pt. Will Perform Grooming ?Flowsheets (Taken 11/14/2021 0955) ?Pt Will Perform Grooming: ? with supervision ? sitting ? standing ?Goal: Pt. Will Perform Upper Body Dressing ?Flowsheets (Taken 11/14/2021 0955) ?Pt Will Perform Upper Body Dressing: ? with modified independence ? sitting ?Goal: Pt. Will Transfer To Toilet ?Flowsheets (Taken 11/14/2021 0955) ?Pt Will Transfer to Toilet: ? with supervision ? stand pivot transfer ? ambulating ? regular height toilet ? bedside commode ?Goal: Pt. Will Perform Toileting-Clothing Manipulation ?Flowsheets (Taken 11/14/2021 0955) ?Pt Will Perform Toileting - Clothing Manipulation and hygiene: ? with supervision ? sitting/lateral leans ? sit to/from stand ?Goal: Pt/Caregiver Will Perform Home Exercise Program ?Flowsheets (Taken 11/14/2021 0955) ?Pt/caregiver will Perform Home Exercise Program: ? Increased strength ? Both right and left upper extremity ? With Supervision ? With written HEP provided ?  ?

## 2021-11-15 ENCOUNTER — Encounter (HOSPITAL_COMMUNITY): Payer: Self-pay | Admitting: Orthopedic Surgery

## 2021-11-15 DIAGNOSIS — I480 Paroxysmal atrial fibrillation: Secondary | ICD-10-CM | POA: Diagnosis not present

## 2021-11-15 DIAGNOSIS — S72002A Fracture of unspecified part of neck of left femur, initial encounter for closed fracture: Secondary | ICD-10-CM | POA: Diagnosis not present

## 2021-11-15 DIAGNOSIS — N1831 Chronic kidney disease, stage 3a: Secondary | ICD-10-CM | POA: Diagnosis not present

## 2021-11-15 DIAGNOSIS — D62 Acute posthemorrhagic anemia: Secondary | ICD-10-CM | POA: Diagnosis not present

## 2021-11-15 LAB — URINE CULTURE: Culture: NO GROWTH

## 2021-11-15 LAB — CBC
HCT: 30.2 % — ABNORMAL LOW (ref 36.0–46.0)
Hemoglobin: 10.2 g/dL — ABNORMAL LOW (ref 12.0–15.0)
MCH: 28.8 pg (ref 26.0–34.0)
MCHC: 33.8 g/dL (ref 30.0–36.0)
MCV: 85.3 fL (ref 80.0–100.0)
Platelets: 221 10*3/uL (ref 150–400)
RBC: 3.54 MIL/uL — ABNORMAL LOW (ref 3.87–5.11)
RDW: 15.2 % (ref 11.5–15.5)
WBC: 12.9 10*3/uL — ABNORMAL HIGH (ref 4.0–10.5)
nRBC: 0 % (ref 0.0–0.2)

## 2021-11-15 LAB — BASIC METABOLIC PANEL
Anion gap: 6 (ref 5–15)
BUN: 25 mg/dL — ABNORMAL HIGH (ref 8–23)
CO2: 18 mmol/L — ABNORMAL LOW (ref 22–32)
Calcium: 7.3 mg/dL — ABNORMAL LOW (ref 8.9–10.3)
Chloride: 111 mmol/L (ref 98–111)
Creatinine, Ser: 0.93 mg/dL (ref 0.44–1.00)
GFR, Estimated: 59 mL/min — ABNORMAL LOW (ref >60)
Glucose, Bld: 116 mg/dL — ABNORMAL HIGH (ref 70–99)
Potassium: 3.6 mmol/L (ref 3.5–5.1)
Sodium: 135 mmol/L (ref 135–145)

## 2021-11-15 LAB — MAGNESIUM: Magnesium: 1.8 mg/dL (ref 1.7–2.4)

## 2021-11-15 MED ORDER — METOPROLOL TARTRATE 25 MG PO TABS
25.0000 mg | ORAL_TABLET | Freq: Three times a day (TID) | ORAL | Status: DC | PRN
  Administered 2021-11-15: 25 mg via ORAL
  Filled 2021-11-15: qty 1

## 2021-11-15 MED ORDER — RIVAROXABAN 15 MG PO TABS
15.0000 mg | ORAL_TABLET | Freq: Every day | ORAL | Status: DC
  Administered 2021-11-15 – 2021-11-16 (×2): 15 mg via ORAL
  Filled 2021-11-15 (×2): qty 1

## 2021-11-15 MED ORDER — ACETAMINOPHEN 325 MG PO TABS: 650.0000 mg | ORAL_TABLET | Freq: Four times a day (QID) | ORAL | Status: DC | PRN

## 2021-11-15 MED ORDER — ACETAMINOPHEN 325 MG PO TABS
ORAL_TABLET | ORAL | Status: AC
  Administered 2021-11-15: 650 mg via ORAL
  Filled 2021-11-15: qty 2

## 2021-11-15 MED ORDER — SODIUM CHLORIDE 0.9 % IV SOLN
250.0000 mg | Freq: Once | INTRAVENOUS | Status: AC
  Administered 2021-11-16: 250 mg via INTRAVENOUS
  Filled 2021-11-15: qty 20

## 2021-11-15 MED ORDER — DILTIAZEM HCL 30 MG PO TABS
30.0000 mg | ORAL_TABLET | Freq: Four times a day (QID) | ORAL | Status: AC
  Administered 2021-11-15 – 2021-11-16 (×3): 30 mg via ORAL
  Filled 2021-11-15 (×3): qty 1

## 2021-11-15 NOTE — Progress Notes (Signed)
?   11/15/21 0501  ?Assess: MEWS Score  ?BP 108/60  ?Pulse Rate (!) 144  ?SpO2 96 %  ?Assess: MEWS Score  ?MEWS Temp 0  ?MEWS Systolic 0  ?MEWS Pulse 3  ?MEWS RR 0  ?MEWS LOC 0  ?MEWS Score 3  ?MEWS Score Color Yellow  ?Assess: if the MEWS score is Yellow or Red  ?Were vital signs taken at a resting state? Yes  ?Focused Assessment No change from prior assessment  ?Early Detection of Sepsis Score *See Row Information* Medium  ?MEWS guidelines implemented *See Row Information* Yes  ?Treat  ?MEWS Interventions Administered scheduled meds/treatments  ?Pain Scale 0-10  ?Pain Score 0  ?Take Vital Signs  ?Increase Vital Sign Frequency  Yellow: Q 2hr X 2 then Q 4hr X 2, if remains yellow, continue Q 4hrs  ?Escalate  ?MEWS: Escalate Yellow: discuss with charge nurse/RN and consider discussing with provider and RRT  ?Notify: Charge Nurse/RN  ?Name of Charge Nurse/RN Notified Deeann Dowse, RN  ?Date Charge Nurse/RN Notified 11/15/21  ?Time Charge Nurse/RN Notified [phone]  ? ? ?

## 2021-11-15 NOTE — Progress Notes (Signed)
Physical Therapy Treatment ?Patient Details ?Name: Diana Mata ?MRN: [id number] ?DOB: Aug 20, 1931 ?Today's Date: 11/15/2021 ? ? ?History of Present Illness Diana Mata is a 88 y.o. female with medical history significant of  atrial flutter, COPD, atrial fibrillation, GERD who presented to the emergency department due to left hip pain.    She was found to have a hip fx and recieved a Lt Hemiarthroplasty on 3/11. ? ?  ?PT Comments  ? ? Patient demonstrates slow labored movement for sitting up at bedside with c/o severe pain with movement of LLE, improvement for tolerating BLE exercises while seated at bedside requiring active assistance to complete LLE LAQ's and hip raises and limited to a few slow labored unsteady side steps with fair/poor carryover for maintaining TDWB on LLE.  Patient tolerated sitting up in chair after therapy with her daughter present in room.  Patient will benefit from continued skilled physical therapy in hospital and recommended venue below to increase strength, balance, endurance for safe ADLs and gait.  ? ?   ?Recommendations for follow up therapy are one component of a multi-disciplinary discharge planning process, led by the attending physician.  Recommendations may be updated based on patient status, additional functional criteria and insurance authorization. ? ?Follow Up Recommendations ? Skilled nursing-short term rehab (<3 hours/day) ?  ?  ?Assistance Recommended at Discharge Intermittent Supervision/Assistance  ?Patient can return home with the following A lot of help with bathing/dressing/bathroom;A lot of help with walking and/or transfers;Help with stairs or ramp for entrance;Assistance with cooking/housework ?  ?Equipment Recommendations ? None recommended by PT  ?  ?Recommendations for Other Services   ? ? ?  ?Precautions / Restrictions Precautions ?Precautions: Fall ?Precaution Booklet Issued: No ?Restrictions ?Weight Bearing Restrictions: Yes ?LLE Weight Bearing:  Touchdown weight bearing  ?  ? ?Mobility ? Bed Mobility ?Overal bed mobility: Needs Assistance ?Bed Mobility: Supine to Sit ?  ?  ?Supine to sit: Mod assist, Max assist ?  ?  ?General bed mobility comments: increased time, labored movement with c/o severe pain left hip with movement ?  ? ?Transfers ?Overall transfer level: Needs assistance ?Equipment used: Rolling walker (2 wheels) ?Transfers: Sit to/from Stand, Bed to chair/wheelchair/BSC ?Sit to Stand: Mod assist ?Stand pivot transfers: Mod assist ?Step pivot transfers: Mod assist ?  ?  ?  ?General transfer comment: requires repeated verbal/tactile cueing for maintaining TWB LLE ?  ? ?Ambulation/Gait ?Ambulation/Gait assistance: Mod assist, Max assist ?Gait Distance (Feet): 6 Feet ?Assistive device: Rolling walker (2 wheels) ?Gait Pattern/deviations: Decreased step length - right, Decreased step length - left, Decreased stride length, Antalgic ?Gait velocity: decreased ?  ?  ?General Gait Details: very unsteady on feet with fair carryover for maintain TWB on LLE, initally shuffling left foot when taking side steps, progressed to very short side steps ? ? ?Stairs ?  ?  ?  ?  ?  ? ? ?Wheelchair Mobility ?  ? ?Modified Rankin (Stroke Patients Only) ?  ? ? ?  ?Balance Overall balance assessment: Needs assistance ?Sitting-balance support: Feet supported, No upper extremity supported ?Sitting balance-Leahy Scale: Fair ?Sitting balance - Comments: fair/good seated at EOB ?  ?Standing balance support: Reliant on assistive device for balance, During functional activity, Bilateral upper extremity supported ?Standing balance-Leahy Scale: Poor ?Standing balance comment: fair/poor using RW ?  ?  ?  ?  ?  ?  ?  ?  ?  ?  ?  ?  ? ?  ?Cognition Arousal/Alertness: Awake/alert ?Behavior During Therapy: [facility]  for tasks assessed/performed ?Overall Cognitive Status: Within Functional Limits for tasks assessed ?  ?  ?  ?  ?  ?  ?  ?  ?  ?  ?  ?  ?  ?  ?  ?  ?  ?  ?  ? ?  ?Exercises  General Exercises - Lower Extremity ?Long Arc Quad: Seated, AROM, AAROM, Strengthening, 10 reps, Both ?Hip Flexion/Marching: Seated, AROM, AAROM, Strengthening, Both, 10 reps ?Toe Raises: Seated, AROM, Strengthening, Both, 10 reps ?Heel Raises: Seated, AROM, Strengthening, Both, 10 reps ? ?  ?General Comments   ?  ?  ? ?Pertinent Vitals/Pain Pain Assessment ?Pain Assessment: Faces ?Faces Pain Scale: Hurts even more ?Pain Location: left hip ?Pain Descriptors / Indicators: Guarding, Sore, Grimacing ?Pain Intervention(s): Limited activity within patient's tolerance, Monitored during session, Premedicated before session, Repositioned  ? ? ?Home Living   ?  ?  ?  ?  ?  ?  ?  ?  ?  ?   ?  ?Prior Function    ?  ?  ?   ? ?PT Goals (current goals can now be found in the care plan section) Progress towards PT goals: Progressing toward goals ? ?  ?Frequency ? ? ? Min 4X/week ? ? ? ?  ?PT Plan Current plan remains appropriate  ? ? ?Co-evaluation   ?  ?  ?  ?  ? ?  ?AM-PAC PT "6 Clicks" Mobility   ?Outcome Measure ? Help needed turning from your back to your side while in a flat bed without using bedrails?: A Lot ?Help needed moving from lying on your back to sitting on the side of a flat bed without using bedrails?: A Lot ?Help needed moving to and from a bed to a chair (including a wheelchair)?: A Lot ?Help needed standing up from a chair using your arms (e.g., wheelchair or bedside chair)?: A Lot ?Help needed to walk in hospital room?: A Lot ?Help needed climbing 3-5 steps with a railing? : Total ?6 Click Score: 11 ? ?  ?End of Session   ?Activity Tolerance: Patient tolerated treatment well;Patient limited by fatigue;Patient limited by pain ?Patient left: in chair;with call bell/phone within reach;with family/visitor present ?Nurse Communication: Mobility status ?PT Visit Diagnosis: Muscle weakness (generalized) (M62.81);History of falling (Z91.81);Difficulty in walking, not elsewhere classified (R26.2) ?  ? ? ?Time:  1010-1031 ?PT Time Calculation (min) (ACUTE ONLY): 21 min ? ?Charges:  $Therapeutic Exercise: 8-22 mins ?$Therapeutic Activity: 8-22 mins          ?          ? ?2:32 PM, 11/15/21 ?Lonell Grandchild, MPT ?Physical Therapist with [place] ?[facility] ?[phone] office ?[phone] mobile phone ? ? ?

## 2021-11-15 NOTE — Progress Notes (Signed)
?  ?       ?PROGRESS NOTE ? ?Diana Mata [id number] DOB: [birth year] DOA: 11/10/2021 ?PCP: Celene Squibb, MD ? ?Brief History:  ?88 year old female with a history of paroxysmal atrial fibrillation, COPD, GERD, IBS, left bundle branch block, CKD stage III presenting with mechanical fall and left hip pain.  The patient was walking to her kitchen when she tripped and fell onto her left side.  She did not lose consciousness.  Ultimately, she was able to activate EMS.  At baseline, the patient is independent with all her ADLs and lives alone.  The patient had been in her usual state of health prior to her mechanical fall.  She denies fevers, chills, headache, chest pain, shortness of breath, coughing, hemoptysis, nausea, vomiting, diarrhea, abdominal pain, dysuria.   ?In the ED, the patient was afebrile hemodynamically stable with oxygen saturation 100% 2 L.  BMP showed sodium 134, potassium 3.8, bicarbonate 19, serum creatinine 1.04.  WBC 13.8, hemoglobin 12.0, platelets 237,000.  X-ray of the left hip showed a left subcapital femoral neck fracture.  Orthopedics was consulted and plans for operative repair on 11/13/2021. ?During evening 3/10-3/11, pt had paroxysms of PAF with RVR and given one dose IV lopressor.   ?Pt was asymptomatic.  Back in sinus rhythm at time of my eval 11/12/21 am. ? ?11/13/21--continued to have paroxysms of Afib RVR;  increased cardizem to 30 mg q6 hr.  Poor po intake.  A little confused this morning.  States pain is controlled.  Denies cp, sob, n/v/d. ? ?11/15/21--had RVR 3/13 requiring lopressor IV 5 mg;  had RVR this am 3/14.  Cardizem CD>>changed back to 30 mg q 6 with metoprolol 25 po prn HR >110.  Pt asymptomatic.  D/c to [facility] cancelled till Afib better controlled  ? ? ? ?Assessment and Plan: ?* Closed displaced fracture of left femoral neck ([place]) ?Left hip x-ray showed left subcapital femoral neck fracture ?Appreciate ortho, Dr. Amedeo Kinsman ?Last dose Xarelto on 11/10/21 at  10AM ?3/11--Left hemiarthroplasty ?Start PT/OT>>SNF ?D/c fentanyl IV ?Continue Norco 5/325>>increase to q 4 ?Restart Xarelto 3/14 ? ?PAF (paroxysmal atrial fibrillation) ([place]) ?Currently in sinus rhythm ?Having more paroxsyms of afib RVR ?Intermittent doses lopressor IV--last dose on 3/13 1429 ?Increase diltiazem to q 6 hours; distal inf HK ?11/13/21 Echo EF 60-65%, trivival MR, AI, mod TR ?Restart Xarelto in am if Hgb remains stable ?Transition to cardizem cd 3/14 initially but continued to have RVR>>back to 30 q 6 hrs ?Metoprolol 25 po prn HR >110 ? ?Chronic kidney disease, stage 3a ([place]) ?Baseline creatinine 0.8-1.1 ?stable ? ?Leukocytosis ?Secondary to stress demargination ?--no fever; hemodynamically stable ?--continues to trend down ? ?Fall ?Continue fall precaution and neurochecks ?Consider PT/OT eval and treat status post surgical repair of femoral neck fracture ? ?Acute blood loss anemia ?Hgb dropped 12>>7.5 ?Due to hip fracture ?Partly dilution ?Transfused 2 units PRBC 3/12 ?FOBT ?Iron studies--iron sat 5, ferritin 107>>nulecit x 1 on 3/13 ?Repeat nulecit 3/15 am ?Hgb now stable las 48 hours ? ?Overactive bladder ?Continue gemtesa with therapeutic interchange ? ?GERD (gastroesophageal reflux disease) ?Continue Protonix ? ?Status is: Inpatient ?Remains inpatient appropriate because: severity of illness requiring operative management ?  ?  ?  ?Family Communication:   daughter updated 3/12 ?  ?Consultants:  ortho--[place] ?  ?Code Status:  DNR ?  ?DVT Prophylaxis:  Xarelto restart ?  ?  ?Procedures: ?As Listed in Progress Note Above ?  ?Antibiotics: ?None ? ? ? ? ?Subjective: ?Patient denies  fevers, chills, headache, chest pain, dyspnea, nausea, vomiting, diarrhea, abdominal pain, dysuria, hematuria, hematochezia, and melena. ? ? ?Objective: ?Vitals:  ? 11/15/21 1035 11/15/21 1236 11/15/21 1404 11/15/21 1732  ?BP: 122/65 106/68 121/66 109/68  ?Pulse: (!) 155 (!) 156 (!) 126 89  ?Resp:   18 18  ?Temp:   98.4 ?F  (36.9 ?C)   ?TempSrc:   Oral   ?SpO2: 94%  94% 92%  ?Weight:      ?Height:      ? ? ?Intake/Output Summary (Last 24 hours) at 11/15/2021 1824 ?Last data filed at 11/15/2021 1300 ?Gross per 24 hour  ?Intake 360 ml  ?Output 400 ml  ?Net -40 ml  ? ?Weight change:  ?Exam: ? ?General:  Pt is alert, follows commands appropriately, not in acute distress ?HEENT: No icterus, No thrush, No neck mass, [place]/AT ?Cardiovascular: IRRR, S1/S2, no rubs, no gallops ?Respiratory: bibasilar crackles. No wheeze ?Abdomen: Soft/+BS, non tender, non distended, no guarding ?Extremities: tracee LE edema, No lymphangitis, No petechiae, No rashes, no synovitis ? ? ?Data Reviewed: ?I have personally reviewed following labs and imaging studies ?Basic Metabolic Panel: ?Recent Labs  ?Lab 11/10/21 ?2119 11/11/21 ?[id number] 11/13/21 ?[id number] 11/14/21 ?0408 11/15/21 ?0416  ?NA 134* 135 135 134* 135  ?K 3.8 3.8 3.7 3.5 3.6  ?CL 104 108 109 107 111  ?CO2 19* 19* 17* 19* 18*  ?GLUCOSE 105* 99 133* 131* 116*  ?BUN 23 21 22  28* 25*  ?CREATININE 1.04* 1.00 1.04* 0.99 0.93  ?CALCIUM 8.8* 8.2* 7.7* 7.6* 7.3*  ?MG  --  2.0 1.8 1.8 1.8  ?PHOS  --  3.5  --   --   --   ? ?Liver Function Tests: ?Recent Labs  ?Lab 11/10/21 ?2119 11/11/21 ?[id number]  ?AST 23 66*  ?ALT 20 43  ?ALKPHOS 77 78  ?BILITOT 0.5 0.5  ?PROT 7.3 5.8*  ?ALBUMIN 4.0 3.2*  ? ?No results for input(s): LIPASE, AMYLASE in the last 168 hours. ?No results for input(s): AMMONIA in the last 168 hours. ?Coagulation Profile: ?No results for input(s): INR, PROTIME in the last 168 hours. ?CBC: ?Recent Labs  ?Lab 11/10/21 ?2119 11/11/21 ?[id number] 11/12/21 ?1030 11/13/21 ?0412 11/13/21 ?1357 11/14/21 ?0408 11/15/21 ?0416  ?WBC 13.8* 11.4*  --  19.2*  --  13.9* 12.9*  ?NEUTROABS 8.3*  --   --   --   --   --   --   ?HGB 12.0 9.6* 9.1* 7.5* 6.9* 10.3* 10.2*  ?HCT 37.3 31.9* 29.3* 24.1* 21.6* 31.2* 30.2*  ?MCV 87.4 88.6  --  88.0  --  86.2 85.3  ?PLT 337 263  --  220  --  191 221  ? ?Cardiac Enzymes: ?No results for input(s):  CKTOTAL, CKMB, CKMBINDEX, TROPONINI in the last 168 hours. ?BNP: ?Invalid input(s): POCBNP ?CBG: ?No results for input(s): GLUCAP in the last 168 hours. ?HbA1C: ?No results for input(s): HGBA1C in the last 72 hours. ?Urine analysis: ?   ?Component Value Date/Time  ? [place] YELLOW 11/14/2021 0028  ? APPEARANCEUR CLOUDY (A) 11/14/2021 0028  ? APPEARANCEUR Hazy (A) 11/16/2020 1342  ? LABSPEC 1.021 11/14/2021 0028  ? PHURINE 5.0 11/14/2021 0028  ? [place] NEGATIVE 11/14/2021 0028  ? HGBUR SMALL (A) 11/14/2021 0028  ? [place] NEGATIVE 11/14/2021 0028  ? BILIRUBINUR Negative 11/16/2020 1342  ? KETONESUR 5 (A) 11/14/2021 0028  ? PROTEINUR 30 (A) 11/14/2021 0028  ? UROBILINOGEN negative (A) 09/24/2019 1629  ? NITRITE NEGATIVE 11/14/2021 0028  ? LEUKOCYTESUR LARGE (A) 11/14/2021  0028  ? ?Sepsis Labs: ?@LABRCNTIP (procalcitonin:4,lacticidven:4) ?) ?Recent Results (from the past 240 hour(s))  ?Resp Panel by RT-PCR (Flu A&B, Covid) Nasopharyngeal Swab     Status: None  ? Collection Time: 11/10/21 11:19 PM  ? Specimen: Nasopharyngeal Swab; Nasopharyngeal(NP) swabs in vial transport medium  ?Result Value Ref Range Status  ? SARS Coronavirus 2 by RT PCR NEGATIVE NEGATIVE Final  ?  Comment: (NOTE) ?SARS-CoV-2 target nucleic acids are NOT DETECTED. ? ?The SARS-CoV-2 RNA is generally detectable in upper respiratory ?specimens during the acute phase of infection. The lowest ?concentration of SARS-CoV-2 viral copies this assay can detect is ?138 copies/mL. A negative result does not preclude SARS-Cov-2 ?infection and should not be used as the sole basis for treatment or ?other patient management decisions. A negative result may occur with  ?improper specimen collection/handling, submission of specimen other ?than nasopharyngeal swab, presence of viral mutation(s) within the ?areas targeted by this assay, and inadequate number of viral ?copies(<138 copies/mL). A negative result must be combined with ?clinical observations,  patient history, and epidemiological ?information. The expected result is Negative. ? ?Fact Sheet for Patients:  ?EntrepreneurPulse.com.au ? ?Fact Sheet for Healthcare Providers:  ?GlobalCosts.fr

## 2021-11-15 NOTE — Progress Notes (Addendum)
? ?Progress Note ? ?Patient Name: Diana Mata ?Date of Encounter: 11/15/2021 ? ?[facility] Cardiologist: Carlyle Dolly, MD  ? ?Subjective  ? ?Afib rates in the 140s, She is essentially asymptomatic. She just worked with PT. ? ?Inpatient Medications  ?  ?Scheduled Meds: ? chlorhexidine  15 mL Mouth/Throat Once  ? Or  ? mouth rinse  15 mL Mouth Rinse Once  ? diltiazem  120 mg Oral Daily  ? feeding supplement  237 mL Oral BID BM  ? mupirocin ointment  1 application. Nasal BID  ? pantoprazole  40 mg Oral Daily  ? traZODone  25 mg Oral QHS  ? ?Continuous Infusions: ? sodium chloride 50 mL/hr at 11/14/21 2147  ? ?PRN Meds: ?HYDROcodone-acetaminophen, ondansetron (ZOFRAN) IV  ? ?Vital Signs  ?  ?Vitals:  ? 11/14/21 1430 11/14/21 1500 11/14/21 2051 11/15/21 0501  ?BP: 122/66 112/65 (!) 125/47 108/60  ?Pulse: (!) 156 80 (!) 46 (!) 144  ?Resp: 18 18    ?Temp:      ?TempSrc:      ?SpO2: 91% 96% 94% 96%  ?Weight:      ?Height:      ? ? ?Intake/Output Summary (Last 24 hours) at 11/15/2021 1031 ?Last data filed at 11/14/2021 1850 ?Gross per 24 hour  ?Intake 120 ml  ?Output 400 ml  ?Net -280 ml  ? ?Last 3 Weights 11/11/2021 03/15/2021 02/04/2021  ?Weight (lbs) 119 lb 0.8 oz 123 lb 125 lb  ?Weight (kg) 54 kg 55.792 kg 56.7 kg  ?   ? ?Telemetry  ?  ?Paroxysmal Afib, HR up to 160s- Personally Reviewed ? ?ECG  ?  ?No new - Personally Reviewed ? ?Physical Exam  ? ?GEN: No acute distress.   ?Neck: No JVD ?Cardiac: Irreg Irreg, tachycardic, no murmurs, rubs, or gallops.  ?Respiratory: Clear to auscultation bilaterally. ?GI: Soft, nontender, non-distended  ?MS: No edema; No deformity. ?Neuro:  Nonfocal  ?Psych: Normal affect  ? ?Labs  ?  ?High Sensitivity Troponin:  No results for input(s): TROPONINIHS in the last 720 hours.   ?Chemistry ?Recent Labs  ?Lab 11/10/21 ?2119 11/10/21 ?2119 11/11/21 ?[id number] 11/13/21 ?[id number] 11/14/21 ?0408 11/15/21 ?0416  ?NA 134*  --  135 135 134* 135  ?K 3.8  --  3.8 3.7 3.5 3.6  ?CL 104  --  108 109 107  111  ?CO2 19*  --  19* 17* 19* 18*  ?GLUCOSE 105*  --  99 133* 131* 116*  ?BUN 23  --  21 22 28* 25*  ?CREATININE 1.04*  --  1.00 1.04* 0.99 0.93  ?CALCIUM 8.8*  --  8.2* 7.7* 7.6* 7.3*  ?MG  --    < > 2.0 1.8 1.8 1.8  ?PROT 7.3  --  5.8*  --   --   --   ?ALBUMIN 4.0  --  3.2*  --   --   --   ?AST 23  --  66*  --   --   --   ?ALT 20  --  43  --   --   --   ?ALKPHOS 77  --  78  --   --   --   ?BILITOT 0.5  --  0.5  --   --   --   ?GFRNONAA 52*  --  54* 52* 55* 59*  ?ANIONGAP 11  --  8 9 8 6   ? < > = values in this interval not displayed.  ?  ?Lipids No results for  input(s): CHOL, TRIG, HDL, LABVLDL, LDLCALC, CHOLHDL in the last 168 hours.  ?Hematology ?Recent Labs  ?Lab 11/13/21 ?0412 11/13/21 ?1357 11/14/21 ?0408 11/15/21 ?0416  ?WBC 19.2*  --  13.9* 12.9*  ?RBC 2.74*  --  3.62* 3.54*  ?HGB 7.5* 6.9* 10.3* 10.2*  ?HCT 24.1* 21.6* 31.2* 30.2*  ?MCV 88.0  --  86.2 85.3  ?MCH 27.4  --  28.5 28.8  ?MCHC 31.1  --  33.0 33.8  ?RDW 15.6*  --  15.0 15.2  ?PLT 220  --  191 221  ? ?Thyroid No results for input(s): TSH, FREET4 in the last 168 hours.  ?BNPNo results for input(s): BNP, PROBNP in the last 168 hours.  ?DDimer No results for input(s): DDIMER in the last 168 hours.  ? ?Radiology  ?  ?ECHOCARDIOGRAM COMPLETE ? ?Result Date: 11/13/2021 ?   ECHOCARDIOGRAM REPORT   Patient Name:   [facility] Date of Exam: 11/13/2021 Medical Rec #:  [id number]           Height:       64.0 in Accession #:    [id number]          Weight:       119.0 lb Date of Birth:  1930-09-24          BSA:          1.569 m? Patient Age:    88 years            BP:           108/52 mmHg Patient Gender: F                   HR:           93 bpm. Exam Location:  [facility] Procedure: 2D Echo, Color Doppler and Cardiac Doppler Indications:    I48.91* Unspeicified atrial fibrillation  History:        Patient has prior history of Echocardiogram examinations, most                 recent 06/07/2018. Abnormal ECG, COPD, Arrythmias:Atrial Flutter                  and Atrial Fibrillation; Signs/Symptoms:Chest Pain and                 Hypotension.  Sonographer:    Roseanna Rainbow RDCS Referring Phys: [phone] DAVID TAT  Sonographer Comments: Technically difficult study due to poor echo windows. Difficult study. Patient had recent hip surgery, could not turn. IMPRESSIONS  1. Poor acoustic windows and frequent ectopy make accurate evaluation of LVEF difficult. OVerall LVEF appears normal with mild distal inferior hypokinesis. . Left ventricular ejection fraction, by estimation, is 60 to 65%. The left ventricle has normal function.  2. Right ventricular systolic function is low normal. The right ventricular size is normal. There is severely elevated pulmonary artery systolic pressure.  3. The mitral valve is normal in structure. Trivial mitral valve regurgitation.  4. Tricuspid valve regurgitation is moderate.  5. The aortic valve is tricuspid. Aortic valve regurgitation is mild.  6. The inferior vena cava is normal in size with greater than 50% respiratory variability, suggesting right atrial pressure of 3 mmHg. FINDINGS  Left Ventricle: Poor acoustic windows and frequent ectopy make accurate evaluation of LVEF difficult. OVerall LVEF appears normal with mild distal inferior hypokinesis. Left ventricular ejection fraction, by estimation, is 60 to 65%. The left ventricle has normal function. The left ventricular internal cavity  size was small. There is no left ventricular hypertrophy. Right Ventricle: The right ventricular size is normal. No increase in right ventricular wall thickness. Right ventricular systolic function is low normal. There is severely elevated pulmonary artery systolic pressure. The tricuspid regurgitant velocity is 3.40 m/s, and with an assumed right atrial pressure of 15 mmHg, the estimated right ventricular systolic pressure is 0000000 mmHg. Left Atrium: Left atrial size was normal in size. Right Atrium: Right atrial size was normal in size. Pericardium: There is no  evidence of pericardial effusion. Mitral Valve: The mitral valve is normal in structure. Trivial mitral valve regurgitation. Tricuspid Valve: The tricuspid valve is normal in structure. Tricuspid valve regurgitation is moderate. Aortic Valve: The aortic valve is tricuspid. Aortic valve regurgitation is mild. Pulmonic Valve: The pulmonic valve was normal in structure. Pulmonic valve regurgitation is mild. Aorta: The aortic root and ascending aorta are structurally normal, with no evidence of dilitation. Venous: The inferior vena cava is normal in size with greater than 50% respiratory variability, suggesting right atrial pressure of 3 mmHg. IAS/Shunts: No atrial level shunt detected by color flow Doppler.  LEFT VENTRICLE PLAX 2D LVIDd:         3.20 cm LVIDs:         2.10 cm LV PW:         1.10 cm LV IVS:        1.00 cm LVOT diam:     2.10 cm LV SV:         59 LV SV Index:   37 LVOT Area:     3.46 cm?  LV Volumes (MOD) LV vol d, MOD A2C: 40.7 ml LV vol d, MOD A4C: 38.9 ml LV vol s, MOD A2C: 13.3 ml LV vol s, MOD A4C: 16.7 ml LV SV MOD A2C:     27.4 ml LV SV MOD A4C:     38.9 ml LV SV MOD BP:      25.2 ml RIGHT VENTRICLE             IVC RV S prime:     10.40 cm/s  IVC diam: 2.10 cm TAPSE (M-mode): 1.6 cm LEFT ATRIUM             Index        RIGHT ATRIUM           Index LA diam:        2.50 cm 1.59 cm/m?   RA Area:     12.80 cm? LA Vol (A2C):   18.6 ml 11.85 ml/m?  RA Volume:   28.10 ml  17.91 ml/m? LA Vol (A4C):   21.7 ml 13.83 ml/m? LA Biplane Vol: 20.1 ml 12.81 ml/m?  AORTIC VALVE             PULMONIC VALVE LVOT Vmax:   121.00 cm/s PR End Diast Vel: 2.20 msec LVOT Vmean:  76.400 cm/s LVOT VTI:    0.169 m  AORTA Ao Root diam: 3.20 cm Ao Asc diam:  3.65 cm MITRAL VALVE               TRICUSPID VALVE MV Area (PHT): 4.77 cm?    TR Peak grad:   46.2 mmHg MV Decel Time: 159 msec    TR Vmax:        340.00 cm/s MV E velocity: 99.88 cm/s MV A velocity: 73.72 cm/s  SHUNTS MV E/A ratio:  1.35        Systemic VTI:  0.17 m  Systemic Diam: 2.10 cm Dorris Carnes MD Electronically signed by Dorris Carnes MD Signature Date/Time: 11/13/2021/1:43:28 PM    Final    ? ?Cardiac Studies  ? ?Echo 11/2021 ? ? 1. Poor aco

## 2021-11-15 NOTE — Progress Notes (Signed)
Occupational Therapy Treatment ?Patient Details ?Name: Diana Mata ?MRN: [id number] ?DOB: [birth year] ?Today's Date: 11/15/2021 ? ? ?History of present illness Diana Mata is a 88 y.o. female with medical history significant of  atrial flutter, COPD, atrial fibrillation, GERD who presented to the emergency department due to left hip pain.    She was found to have a hip fx and recieved a Lt Hemiarthroplasty on 3/11. ?  ?OT comments ? Pt being treated by PT upon start of session. OT joined session and continued once PT left. Pt required mod A for transfer today from EOB to chair. Pt demonstrates L UE limitations in A/ROM for flexion and abduction but able to engage in general UE strengthening with supervision to min A. Pt reported fatigue and need for rest at times. Pt left in chair with call bell within reach and family present. Pt will benefit from continued OT in the hospital and recommended venue below to increase strength, balance, and endurance for safe ADL's.  ? ?  ? ?Recommendations for follow up therapy are one component of a multi-disciplinary discharge planning process, led by the attending physician.  Recommendations may be updated based on patient status, additional functional criteria and insurance authorization. ?   ?Follow Up Recommendations ? Skilled nursing-short term rehab (<3 hours/day)  ?  ?Assistance Recommended at Discharge Frequent or constant Supervision/Assistance  ?Patient can return home with the following ? Two people to help with walking and/or transfers;Two people to help with bathing/dressing/bathroom;Assistance with cooking/housework;Direct supervision/assist for medications management;Assist for transportation;Help with stairs or ramp for entrance ?  ?Equipment Recommendations ? None recommended by OT  ?  ?Recommendations for Other Services   ? ?  ?Precautions / Restrictions Precautions ?Precautions: Posterior Hip ?Precaution Booklet Issued: No ?Restrictions ?Weight  Bearing Restrictions: Yes ?LLE Weight Bearing: Touchdown weight bearing  ? ? ?  ? ?Mobility   ?  ?  ?  ?  ?  ?  ?  ?  ? ?Transfers ?Overall transfer level: Needs assistance ?Equipment used: Rolling walker (2 wheels) ?Transfers: Sit to/from Stand, Bed to chair/wheelchair/BSC ?Sit to Stand: Mod assist ?  ?  ?Step pivot transfers: Mod assist ?  ?  ?General transfer comment: Labored movement with verbal and tactile cues to move LLE. ?  ?  ?Balance Overall balance assessment: Needs assistance ?Sitting-balance support: Feet supported, No upper extremity supported ?Sitting balance-Leahy Scale: Fair ?Sitting balance - Comments: fair to good seated at EOB ?  ?Standing balance support: Reliant on assistive device for balance, During functional activity, Bilateral upper extremity supported ?Standing balance-Leahy Scale: Poor ?Standing balance comment: fair/poor using RW ?  ?  ?  ?  ?  ?  ?  ?  ?  ?  ?  ?   ? ? ? ? ?Cognition Arousal/Alertness: Awake/alert ?Behavior During Therapy: [facility] for tasks assessed/performed ?Overall Cognitive Status: Within Functional Limits for tasks assessed ?  ?  ?  ?  ?  ?  ?  ?  ?  ?  ?  ?  ?  ?  ?  ?  ?  ?  ?  ?   ?Exercises Exercises: General Upper Extremity ?General Exercises - Upper Extremity ?Shoulder Flexion: AROM, 10 reps, Right, Left, Seated ?Shoulder ABduction: AAROM, 10 reps, Seated, Both (Min A needed for L UE to reach full range.) ?Shoulder Horizontal ABduction: AROM, Both, 10 reps, Seated (x10 protraction as well) ? ?  ?   ? ? ?  ?    ? ? ?  Pertinent Vitals/ Pain       Pain Assessment ?Pain Assessment: Faces ?Faces Pain Scale: Hurts little more ?Pain Location: left hip ?Pain Descriptors / Indicators: Guarding ?Pain Intervention(s): Limited activity within patient's tolerance, Monitored during session, Repositioned ? ?   ?  ?  ?  ?  ?  ?  ?  ?  ?  ?  ?  ?  ?  ?  ?  ?  ?  ?  ? ?  ?    ?  ?  ?  ?   ? ?Frequency ? Min 1X/week  ? ? ? ? ?  ?Progress Toward Goals ? ?OT Goals(current goals can  now be found in the care plan section) ? Progress towards OT goals: Progressing toward goals ? ?Acute Rehab OT Goals ?Patient Stated Goal: to have less pain ?OT Goal Formulation: With patient ?Time For Goal Achievement: 11/28/21 ?Potential to Achieve Goals: Good ?ADL Goals ?Pt Will Perform Eating: with modified independence;sitting ?Pt Will Perform Grooming: with supervision;sitting;standing ?Pt Will Perform Upper Body Dressing: with modified independence;sitting ?Pt Will Transfer to Toilet: with supervision;stand pivot transfer;ambulating;regular height toilet;bedside commode ?Pt Will Perform Toileting - Clothing Manipulation and hygiene: with supervision;sitting/lateral leans;sit to/from stand ?Pt/caregiver will Perform Home Exercise Program: Increased strength;Both right and left upper extremity;With Supervision;With written HEP provided  ?Plan Discharge plan remains appropriate   ? ?   ?  ?  ?  ?  ? ?  ?   ?  ?  ?  ?  ?  ?  ? ?  ?End of Session Equipment Utilized During Treatment: Rolling walker (2 wheels) ? ?OT Visit Diagnosis: Muscle weakness (generalized) (M62.81);Pain ?Pain - Right/Left: Left ?Pain - part of body: Hip ?  ?Activity Tolerance Patient tolerated treatment well ?  ?Patient Left in chair;with call bell/phone within reach;with family/visitor present ?  ?   ?  ? ?   ? ?Time: 1025-1040 ?OT Time Calculation (min): 15 min ? ?Charges: OT General Charges ?$OT Visit: 1 Visit ?OT Treatments ?$Therapeutic Exercise: 8-22 mins ? ?Danie Chandler OT, MOT ? ?Danie Chandler ?11/15/2021, 10:48 AM ?

## 2021-11-15 NOTE — TOC Progression Note (Addendum)
Transition of Care (TOC) - Progression Note  ? ? ?Patient Details  ?Name: Marijayne Cheek-Eurich ?MRN: [id number] ?Date of Birth: [birth year] ? ?Transition of Care (TOC) CM/SW Contact  ?Elliot Gault, LCSW ?Phone Number: ?11/15/2021, 11:19 AM ? ?Clinical Narrative:    ? ?TOC following. Per MD, pt not yet medically stable for dc. Pt needs further cardiology services. Updated Kerri at [facility]. Insurance Berkley Harvey is still pending.  ? ?TOC will follow. ? ?1630: Notified by pt's insurance that they have approved pt for SNF rehab 3/14-3/21. Auth number is [id number]. Updated Kerri at [facility]. TOC will follow up tomorrow to further assist with dc planning. ? ?Expected Discharge Plan: Skilled Nursing Facility ?Barriers to Discharge: Continued Medical Work up ? ?Expected Discharge Plan and Services ?Expected Discharge Plan: Skilled Nursing Facility ?In-house Referral: Clinical Social Work ?  ?Post Acute Care Choice: Skilled Nursing Facility ?Living arrangements for the past 2 months: Single Family Home ?                ?  ?  ?  ?  ?  ?  ?  ?  ?  ?  ? ? ?Social Determinants of Health (SDOH) Interventions ?  ? ?Readmission Risk Interventions ?No flowsheet data found. ? ?

## 2021-11-15 NOTE — Plan of Care (Signed)

## 2021-11-15 NOTE — Progress Notes (Signed)
Patient in A-fib, HR running 140-150s. Dr. Josephine Cables notified. MD gave order to administer Cardizem early ?

## 2021-11-16 DIAGNOSIS — I4891 Unspecified atrial fibrillation: Secondary | ICD-10-CM

## 2021-11-16 DIAGNOSIS — S72002A Fracture of unspecified part of neck of left femur, initial encounter for closed fracture: Secondary | ICD-10-CM | POA: Diagnosis not present

## 2021-11-16 DIAGNOSIS — I480 Paroxysmal atrial fibrillation: Secondary | ICD-10-CM | POA: Diagnosis not present

## 2021-11-16 DIAGNOSIS — N1831 Chronic kidney disease, stage 3a: Secondary | ICD-10-CM | POA: Diagnosis not present

## 2021-11-16 DIAGNOSIS — D62 Acute posthemorrhagic anemia: Secondary | ICD-10-CM | POA: Diagnosis not present

## 2021-11-16 LAB — MAGNESIUM: Magnesium: 1.9 mg/dL (ref 1.7–2.4)

## 2021-11-16 LAB — BASIC METABOLIC PANEL
Anion gap: 6 (ref 5–15)
BUN: 28 mg/dL — ABNORMAL HIGH (ref 8–23)
CO2: 19 mmol/L — ABNORMAL LOW (ref 22–32)
Calcium: 7.4 mg/dL — ABNORMAL LOW (ref 8.9–10.3)
Chloride: 112 mmol/L — ABNORMAL HIGH (ref 98–111)
Creatinine, Ser: 0.93 mg/dL (ref 0.44–1.00)
GFR, Estimated: 59 mL/min — ABNORMAL LOW (ref >60)
Glucose, Bld: 105 mg/dL — ABNORMAL HIGH (ref 70–99)
Potassium: 3.6 mmol/L (ref 3.5–5.1)
Sodium: 137 mmol/L (ref 135–145)

## 2021-11-16 LAB — CBC
HCT: 28.9 % — ABNORMAL LOW (ref 36.0–46.0)
Hemoglobin: 9.2 g/dL — ABNORMAL LOW (ref 12.0–15.0)
MCH: 27.3 pg (ref 26.0–34.0)
MCHC: 31.8 g/dL (ref 30.0–36.0)
MCV: 85.8 fL (ref 80.0–100.0)
Platelets: 259 10*3/uL (ref 150–400)
RBC: 3.37 MIL/uL — ABNORMAL LOW (ref 3.87–5.11)
RDW: 15.5 % (ref 11.5–15.5)
WBC: 10.2 10*3/uL (ref 4.0–10.5)
nRBC: 0 % (ref 0.0–0.2)

## 2021-11-16 MED ORDER — BISACODYL 5 MG PO TBEC
5.0000 mg | DELAYED_RELEASE_TABLET | Freq: Every day | ORAL | Status: DC | PRN
  Administered 2021-11-16: 5 mg via ORAL
  Filled 2021-11-16: qty 1

## 2021-11-16 MED ORDER — DILTIAZEM HCL ER COATED BEADS 180 MG PO CP24
180.0000 mg | ORAL_CAPSULE | Freq: Every day | ORAL | Status: DC
Start: 2021-11-16 — End: 2021-11-17
  Administered 2021-11-16 – 2021-11-17 (×2): 180 mg via ORAL
  Filled 2021-11-16 (×2): qty 1

## 2021-11-16 MED ORDER — POTASSIUM CHLORIDE CRYS ER 20 MEQ PO TBCR
40.0000 meq | EXTENDED_RELEASE_TABLET | Freq: Once | ORAL | Status: AC
  Administered 2021-11-16: 40 meq via ORAL
  Filled 2021-11-16: qty 2

## 2021-11-16 MED ORDER — POLYETHYLENE GLYCOL 3350 17 G PO PACK
17.0000 g | PACK | Freq: Every day | ORAL | Status: DC
  Administered 2021-11-16 – 2021-11-17 (×2): 17 g via ORAL
  Filled 2021-11-16 (×2): qty 1

## 2021-11-16 MED ORDER — FLEET ENEMA 7-19 GM/118ML RE ENEM: 1.0000 | ENEMA | Freq: Every day | RECTAL | Status: DC | PRN

## 2021-11-16 MED ORDER — BISACODYL 10 MG RE SUPP
10.0000 mg | Freq: Every day | RECTAL | Status: DC | PRN
  Administered 2021-11-16: 10 mg via RECTAL
  Filled 2021-11-16: qty 1

## 2021-11-16 MED ORDER — METOPROLOL TARTRATE 5 MG/5ML IV SOLN
5.0000 mg | Freq: Once | INTRAVENOUS | Status: AC
  Administered 2021-11-16: 5 mg via INTRAVENOUS
  Filled 2021-11-16: qty 5

## 2021-11-16 NOTE — Progress Notes (Signed)
Physical Therapy Treatment ?Patient Details ?Name: Diana Mata ?MRN: [id number] ?DOB: 05/24/1931 ?Today's Date: 11/16/2021 ? ? ?History of Present Illness Kawther Poppell is a 88 y.o. female with medical history significant of  atrial flutter, COPD, atrial fibrillation, GERD who presented to the emergency department due to left hip pain.    She was found to have a hip fx and recieved a Lt Hemiarthroplasty on 3/11. ? ?  ?PT Comments  ? ? Patient presents seated on [facility] (assisted by nursing staff) and agreeable for therapy.  Patient demonstrates slow labored movement for taking a few side steps requiring tactile assistance to move LLE due to weakness, increased pain and declined to sit up in chair after transferring to [facility] due to fatigue.  Patient demonstrates fair/good return for using RLE to help reposition self when put back to bed.  Patient will benefit from continued skilled physical therapy in hospital and recommended venue below to increase strength, balance, endurance for safe ADLs and gait.  ?   ?Recommendations for follow up therapy are one component of a multi-disciplinary discharge planning process, led by the attending physician.  Recommendations may be updated based on patient status, additional functional criteria and insurance authorization. ? ?Follow Up Recommendations ? Skilled nursing-short term rehab (<3 hours/day) ?  ?  ?Assistance Recommended at Discharge Intermittent Supervision/Assistance  ?Patient can return home with the following A lot of help with bathing/dressing/bathroom;A lot of help with walking and/or transfers;Help with stairs or ramp for entrance;Assistance with cooking/housework ?  ?Equipment Recommendations ? None recommended by PT  ?  ?Recommendations for Other Services   ? ? ?  ?Precautions / Restrictions Precautions ?Precautions: Fall ?Precaution Booklet Issued: No ?Restrictions ?Weight Bearing Restrictions: Yes ?LLE Weight Bearing: Touchdown weight bearing  ?   ? ?Mobility ? Bed Mobility ?Overal bed mobility: Needs Assistance ?Bed Mobility: Sit to Supine ?  ?  ?  ?Sit to supine: Mod assist ?  ?General bed mobility comments: required Mod assist to move LLE onto bed, fair/good return for bridging with RLE for repositioning in bed ?  ? ?Transfers ?Overall transfer level: Needs assistance ?Equipment used: Rolling walker (2 wheels) ?Transfers: Sit to/from Stand, Bed to chair/wheelchair/BSC ?Sit to Stand: Mod assist ?Stand pivot transfers: Mod assist ?  ?  ?  ?  ?General transfer comment: slow labored movement during transfer from [facility] to bedside ?  ? ?Ambulation/Gait ?Ambulation/Gait assistance: Mod assist, Max assist ?Gait Distance (Feet): 5 Feet ?Assistive device: Rolling walker (2 wheels) ?Gait Pattern/deviations: Decreased step length - right, Decreased step length - left, Decreased stride length, Antalgic ?Gait velocity: decreased ?  ?  ?General Gait Details: limited to a few slow labored side steps at bedside with difficulty advancing LLE requiring tactile assistance to move LLE ? ? ?Stairs ?  ?  ?  ?  ?  ? ? ?Wheelchair Mobility ?  ? ?Modified Rankin (Stroke Patients Only) ?  ? ? ?  ?Balance Overall balance assessment: Needs assistance ?Sitting-balance support: Feet supported, No upper extremity supported ?Sitting balance-Leahy Scale: Fair ?Sitting balance - Comments: fair/good seated at EOB ?  ?Standing balance support: Reliant on assistive device for balance, During functional activity, Bilateral upper extremity supported ?Standing balance-Leahy Scale: Poor ?Standing balance comment: fair/poor using RW ?  ?  ?  ?  ?  ?  ?  ?  ?  ?  ?  ?  ? ?  ?Cognition Arousal/Alertness: Awake/alert ?Behavior During Therapy: [facility] for tasks assessed/performed ?Overall Cognitive Status: Within Functional  Limits for tasks assessed ?  ?  ?  ?  ?  ?  ?  ?  ?  ?  ?  ?  ?  ?  ?  ?  ?  ?  ?  ? ?  ?Exercises Total Joint Exercises ?Ankle Circles/Pumps: Supine, 10 reps, Both, Strengthening,  AROM ?Quad Sets: Supine, 10 reps, Both, Strengthening, AROM ?Gluteal Sets: Supine, 10 reps, Both, Strengthening, AROM ?Short Arc Quad: Supine, 10 reps, Both, Strengthening, AROM ?Heel Slides: Supine, 10 reps, Both, Strengthening, AROM ? ?  ?General Comments   ?  ?  ? ?Pertinent Vitals/Pain Pain Assessment ?Pain Assessment: Faces ?Faces Pain Scale: Hurts even more ?Pain Location: left hip ?Pain Descriptors / Indicators: Guarding, Sore, Grimacing ?Pain Intervention(s): Limited activity within patient's tolerance, Monitored during session, Premedicated before session, Repositioned  ? ? ?Home Living   ?  ?  ?  ?  ?  ?  ?  ?  ?  ?   ?  ?Prior Function    ?  ?  ?   ? ?PT Goals (current goals can now be found in the care plan section) Progress towards PT goals: Progressing toward goals ? ?  ?Frequency ? ? ? Min 4X/week ? ? ? ?  ?PT Plan Current plan remains appropriate  ? ? ?Co-evaluation   ?  ?  ?  ?  ? ?  ?AM-PAC PT "6 Clicks" Mobility   ?Outcome Measure ? Help needed turning from your back to your side while in a flat bed without using bedrails?: A Lot ?Help needed moving from lying on your back to sitting on the side of a flat bed without using bedrails?: A Lot ?Help needed moving to and from a bed to a chair (including a wheelchair)?: A Lot ?Help needed standing up from a chair using your arms (e.g., wheelchair or bedside chair)?: A Lot ?Help needed to walk in hospital room?: A Lot ?Help needed climbing 3-5 steps with a railing? : Total ?6 Click Score: 11 ? ?  ?End of Session   ?Activity Tolerance: Patient tolerated treatment well;Patient limited by fatigue;Patient limited by pain ?Patient left: in bed;with call bell/phone within reach;with family/visitor present ?Nurse Communication: Mobility status ?PT Visit Diagnosis: Muscle weakness (generalized) (M62.81);History of falling (Z91.81);Difficulty in walking, not elsewhere classified (R26.2) ?  ? ? ?Time: 1105-1130 ?PT Time Calculation (min) (ACUTE ONLY): 25  min ? ?Charges:  $Therapeutic Exercise: 8-22 mins ?$Therapeutic Activity: 8-22 mins          ?          ? ?12:36 PM, 11/16/21 ?Lonell Grandchild, MPT ?Physical Therapist with [place] ?[facility] ?[phone] office ?[phone] mobile phone ? ? ?

## 2021-11-16 NOTE — TOC Progression Note (Signed)
Transition of Care (TOC) - Progression Note  ? ? ?Patient Details  ?Name: Diana Mata ?MRN: [id number] ?Date of Birth: July 14, 1931 ? ?Transition of Care (TOC) CM/SW Contact  ?Karn Cassis, LCSW ?Phone Number: ?11/16/2021, 10:51 AM ? ?Clinical Narrative:  LCSW updated PNC on pt. Anticipate d/c tomorrow. TOC will continue to follow.   ? ? ? ?Expected Discharge Plan: Skilled Nursing Facility ?Barriers to Discharge: Continued Medical Work up ? ?Expected Discharge Plan and Services ?Expected Discharge Plan: Skilled Nursing Facility ?In-house Referral: Clinical Social Work ?  ?Post Acute Care Choice: Skilled Nursing Facility ?Living arrangements for the past 2 months: Single Family Home ?                ?  ?  ?  ?  ?  ?  ?  ?  ?  ?  ? ? ?Social Determinants of Health (SDOH) Interventions ?  ? ?Readmission Risk Interventions ?No flowsheet data found. ? ?

## 2021-11-16 NOTE — Progress Notes (Signed)
? ?   Telemetry reviewed, chart reviewed.  ? ? ?88 y.o. female  with a hx of paroxysmal atrial fibrillation/flutter (on Xarelto), chronic LBBB, HTN, COPD, and Stage 3 CKD who is being seen 11/11/2021 for the evaluation of preoperative cardiac clearance for left hip hemiarthroplasty ? ?Assessment & Plan  ?  ?1.Paroxysmal afib/aflutter ?- some issues with afib high rates in postop period, has been paroxysmal ?- she is on diltiazem long acting 180mg  daily(first dose this AM), lopressor 25mg  prn ?- bp's are stable tolerating av nodal agents,  ? ?- restart xarelto when ok from surgical and anemia standpoint ?- would watch rate and bp's today, possible discharge tomorrow pending rates and blood pressures ? ? ?2. Hip fracture ?- per ortho and primary team ?- left hemiarthroplasty 3/11 ? ? ? ? ?For questions or updates, please contact [facility] ?Please consult [email or web address] for contact info under  ? ?  ?   ?Signed, ? , MD  ?11/16/2021, 10:17 AM   ? ?

## 2021-11-16 NOTE — Progress Notes (Signed)
?Progress Note ? ? ?PatientChrisoula Mata AVW:[id number] DOB: [birth year] DOA: 11/10/2021     5 ?DOS: the patient was seen and examined on 11/16/2021 ?  ?Brief hospital course: ?88 year old female with a history of paroxysmal atrial fibrillation, COPD, GERD, IBS, left bundle branch block, CKD stage III presenting with mechanical fall and left hip pain.  The patient was walking to her kitchen when she tripped and fell onto her left side.  She did not lose consciousness.  Ultimately, she was able to activate EMS.  At baseline, the patient is independent with all her ADLs and lives alone.  The patient had been in her usual state of health prior to her mechanical fall.  She denies fevers, chills, headache, chest pain, shortness of breath, coughing, hemoptysis, nausea, vomiting, diarrhea, abdominal pain, dysuria.   ?In the ED, the patient was afebrile hemodynamically stable with oxygen saturation 100% 2 L.  BMP showed sodium 134, potassium 3.8, bicarbonate 19, serum creatinine 1.04.  WBC 13.8, hemoglobin 12.0, platelets 237,000.  X-ray of the left hip showed a left subcapital femoral neck fracture.  Orthopedics was consulted and plans for operative repair on 11/13/2021. ?During evening 3/10-3/11, pt had paroxysms of PAF with RVR and given one dose IV lopressor.   ?Pt was asymptomatic.  Back in sinus rhythm at time of my eval 11/12/21 am. ? ?11/13/21--continued to have paroxysms of Afib RVR;  increased cardizem to 30 mg q6 hr.  Poor po intake.  A little confused this morning.  States pain is controlled.  Denies cp, sob, n/v/d. ? ?11/15/21--had RVR 3/13 requiring lopressor IV 5 mg;  had RVR this am 3/14.  Cardizem CD>>changed back to 30 mg q 6 with metoprolol 25 po prn HR >110.  Pt asymptomatic.  D/c to [facility] cancelled till Afib better controlled ? ?Assessment and Plan: ?* Closed displaced fracture of left femoral neck (HCC) ?Left hip x-ray showed left subcapital femoral neck fracture ?Appreciate ortho, Dr.  Dallas Schimke ?Last dose Xarelto on 11/10/21 at 10AM ?3/11--Left hemiarthroplasty ?Start PT/OT>>SNF ?D/c fentanyl IV ?Continue Norco 5/325>>increase to q 4 ?Restart Xarelto 3/14 ? ?PAF (paroxysmal atrial fibrillation) (HCC) ?Anticoagulated with Xarelto ?-Continue to have episodes of tachycardia today ?Diltiazem CD increased to 180 mg ?Continue as needed Lopressor for tachycardia ?Cardiology input appreciated ?Monitor another 24 hours ? ?Chronic kidney disease, stage 3a (HCC) ?Baseline creatinine 0.8-1.1 ?stable ? ?Leukocytosis ?Secondary to stress demargination ?--no fever; hemodynamically stable ?-- Resolved ? ?Fall ?Plans are for skilled nursing facility placement ? ?Acute blood loss anemia ?Hgb dropped 12>>7.5 ?Due to hip fracture ?Partly dilution ?Transfused 2 units PRBC 3/12 ?FOBT ?Iron studies--iron sat 5, ferritin 107>>nulecit x 1 on 3/13 ?Repeat nulecit 3/15 am ?Hgb now stable las 48 hours ? ?Overactive bladder ?Continue gemtesa with therapeutic interchange ? ?GERD (gastroesophageal reflux disease) ?Continue Protonix ? ? ? ? ?  ? ?Subjective: Says she feels short of breath.  Denies any chest pain.  Feels sore in her hip. ? ?Physical Exam: ?Vitals:  ? 11/16/21 0946 11/16/21 1216 11/16/21 1346 11/16/21 1500  ?BP: (!) 121/56 128/69 123/84 119/63  ?Pulse: 98 (!) 110 (!) 117 80  ?Resp: 18 16 18 18   ?Temp:  98.8 ?F (37.1 ?C) 98.8 ?F (37.1 ?C)   ?TempSrc:  Axillary    ?SpO2: 91% 95% 95% 92%  ?Weight:      ?Height:      ? ?General exam: Alert, awake, oriented x 3 ?Respiratory system: Clear to auscultation. Respiratory effort normal. ?Cardiovascular system: Irregular. No  murmurs, rubs, gallops. ?Gastrointestinal system: Abdomen is nondistended, soft and nontender. No organomegaly or masses felt. Normal bowel sounds heard. ?Central nervous system: Alert and oriented. No focal neurological deficits. ?Extremities: No C/C/E, +pedal pulses ?Skin: No rashes, lesions or ulcers ?Psychiatry: Judgement and insight appear normal. Mood &  affect appropriate.  ? ?Data Reviewed: ? ?Reviewed chemistry and CBC ? ?Family Communication: updated patient's daughter over the phone ? ?Disposition: ?Status is: Inpatient ?Remains inpatient appropriate because: Hemodynamic instability with tachycardia due to atrial fibrillation ? Planned Discharge Destination: Skilled nursing facility ? ? ? ?Time spent: 35 minutes ? ?Author: ?Erick Blinks, MD ?11/16/2021 8:05 PM ? ?For on call review [email or web address].  ?

## 2021-11-16 NOTE — Progress Notes (Signed)
?   11/16/21 1346  ?Assess: MEWS Score  ?Temp 98.8 ?F (37.1 ?C)  ?BP 123/84  ?Pulse Rate (!) 117  ?Resp 18  ?Level of Consciousness Alert  ?SpO2 95 %  ?O2 Device Room Air  ?Assess: MEWS Score  ?MEWS Temp 0  ?MEWS Systolic 0  ?MEWS Pulse 2  ?MEWS RR 0  ?MEWS LOC 0  ?MEWS Score 2  ?MEWS Score Color Yellow  ?Assess: if the MEWS score is Yellow or Red  ?Were vital signs taken at a resting state? Yes  ?Focused Assessment No change from prior assessment  ?Early Detection of Sepsis Score *See Row Information* Low  ?MEWS guidelines implemented *See Row Information* Yes  ?Treat  ?MEWS Interventions Escalated (See documentation below)  ?Pain Scale 0-10  ?Pain Score 0  ?Take Vital Signs  ?Increase Vital Sign Frequency  Yellow: Q 2hr X 2 then Q 4hr X 2, if remains yellow, continue Q 4hrs  ?Escalate  ?MEWS: Escalate Yellow: discuss with charge nurse/RN and consider discussing with provider and RRT  ?Notify: Charge Nurse/RN  ?Name of Charge Nurse/RN Notified Jiles Crocker RN  ?Date Charge Nurse/RN Notified 11/16/21  ?Time Charge Nurse/RN Notified 1348  ?Notify: Provider  ?Provider Name/Title Dr. Kerry Hough  ?Date Provider Notified 11/16/21  ?Time Provider Notified 1349  ?Notification Type Face-to-face  ?Notification Reason Other (Comment) ?(discussed heart rate in patient's room)  ? ? ?

## 2021-11-17 DIAGNOSIS — N1831 Chronic kidney disease, stage 3a: Secondary | ICD-10-CM | POA: Diagnosis not present

## 2021-11-17 DIAGNOSIS — S72002A Fracture of unspecified part of neck of left femur, initial encounter for closed fracture: Secondary | ICD-10-CM | POA: Diagnosis not present

## 2021-11-17 DIAGNOSIS — Z96642 Presence of left artificial hip joint: Secondary | ICD-10-CM | POA: Diagnosis not present

## 2021-11-17 DIAGNOSIS — I440 Atrioventricular block, first degree: Secondary | ICD-10-CM | POA: Diagnosis not present

## 2021-11-17 DIAGNOSIS — R2681 Unsteadiness on feet: Secondary | ICD-10-CM | POA: Diagnosis not present

## 2021-11-17 DIAGNOSIS — S72002D Fracture of unspecified part of neck of left femur, subsequent encounter for closed fracture with routine healing: Secondary | ICD-10-CM | POA: Diagnosis not present

## 2021-11-17 DIAGNOSIS — D649 Anemia, unspecified: Secondary | ICD-10-CM | POA: Diagnosis not present

## 2021-11-17 DIAGNOSIS — Z9181 History of falling: Secondary | ICD-10-CM | POA: Diagnosis not present

## 2021-11-17 DIAGNOSIS — K219 Gastro-esophageal reflux disease without esophagitis: Secondary | ICD-10-CM | POA: Diagnosis not present

## 2021-11-17 DIAGNOSIS — I480 Paroxysmal atrial fibrillation: Secondary | ICD-10-CM | POA: Diagnosis not present

## 2021-11-17 DIAGNOSIS — M6281 Muscle weakness (generalized): Secondary | ICD-10-CM | POA: Diagnosis not present

## 2021-11-17 DIAGNOSIS — R41841 Cognitive communication deficit: Secondary | ICD-10-CM | POA: Diagnosis not present

## 2021-11-17 DIAGNOSIS — R262 Difficulty in walking, not elsewhere classified: Secondary | ICD-10-CM | POA: Diagnosis not present

## 2021-11-17 DIAGNOSIS — J449 Chronic obstructive pulmonary disease, unspecified: Secondary | ICD-10-CM | POA: Diagnosis not present

## 2021-11-17 DIAGNOSIS — Z741 Need for assistance with personal care: Secondary | ICD-10-CM | POA: Diagnosis not present

## 2021-11-17 DIAGNOSIS — I447 Left bundle-branch block, unspecified: Secondary | ICD-10-CM | POA: Diagnosis not present

## 2021-11-17 DIAGNOSIS — N3281 Overactive bladder: Secondary | ICD-10-CM | POA: Diagnosis not present

## 2021-11-17 DIAGNOSIS — D62 Acute posthemorrhagic anemia: Secondary | ICD-10-CM | POA: Diagnosis not present

## 2021-11-17 DIAGNOSIS — K589 Irritable bowel syndrome without diarrhea: Secondary | ICD-10-CM | POA: Diagnosis not present

## 2021-11-17 DIAGNOSIS — I10 Essential (primary) hypertension: Secondary | ICD-10-CM | POA: Diagnosis not present

## 2021-11-17 DIAGNOSIS — N390 Urinary tract infection, site not specified: Secondary | ICD-10-CM | POA: Diagnosis not present

## 2021-11-17 LAB — BASIC METABOLIC PANEL
Anion gap: 8 (ref 5–15)
BUN: 20 mg/dL (ref 8–23)
CO2: 17 mmol/L — ABNORMAL LOW (ref 22–32)
Calcium: 7.6 mg/dL — ABNORMAL LOW (ref 8.9–10.3)
Chloride: 110 mmol/L (ref 98–111)
Creatinine, Ser: 0.79 mg/dL (ref 0.44–1.00)
GFR, Estimated: >60 mL/min (ref >60)
Glucose, Bld: 112 mg/dL — ABNORMAL HIGH (ref 70–99)
Potassium: 3.9 mmol/L (ref 3.5–5.1)
Sodium: 135 mmol/L (ref 135–145)

## 2021-11-17 LAB — CBC
HCT: 30.9 % — ABNORMAL LOW (ref 36.0–46.0)
Hemoglobin: 10.2 g/dL — ABNORMAL LOW (ref 12.0–15.0)
MCH: 28.4 pg (ref 26.0–34.0)
MCHC: 33 g/dL (ref 30.0–36.0)
MCV: 86.1 fL (ref 80.0–100.0)
Platelets: 351 10*3/uL (ref 150–400)
RBC: 3.59 MIL/uL — ABNORMAL LOW (ref 3.87–5.11)
RDW: 15.6 % — ABNORMAL HIGH (ref 11.5–15.5)
WBC: 12.9 10*3/uL — ABNORMAL HIGH (ref 4.0–10.5)
nRBC: 0 % (ref 0.0–0.2)

## 2021-11-17 MED ORDER — DILTIAZEM HCL ER COATED BEADS 180 MG PO CP24: 180.0000 mg | ORAL_CAPSULE | Freq: Every day | ORAL | Status: DC

## 2021-11-17 MED ORDER — HYDROCODONE-ACETAMINOPHEN 5-325 MG PO TABS: 1.0000 | ORAL_TABLET | ORAL | 0 refills | Status: DC | PRN

## 2021-11-17 MED ORDER — ACETAMINOPHEN 325 MG PO TABS
650.0000 mg | ORAL_TABLET | Freq: Four times a day (QID) | ORAL | Status: DC | PRN
Start: 2021-11-17 — End: 2021-11-29

## 2021-11-17 MED ORDER — SODIUM BICARBONATE 650 MG PO TABS
1300.0000 mg | ORAL_TABLET | Freq: Two times a day (BID) | ORAL | Status: DC
  Administered 2021-11-17: 1300 mg via ORAL
  Filled 2021-11-17: qty 2

## 2021-11-17 MED ORDER — POLYETHYLENE GLYCOL 3350 17 G PO PACK: 17.0000 g | PACK | Freq: Every day | ORAL | 0 refills | Status: AC

## 2021-11-17 MED ORDER — DIAZEPAM 5 MG PO TABS: 5.0000 mg | ORAL_TABLET | Freq: Four times a day (QID) | ORAL | 0 refills | Status: DC | PRN

## 2021-11-17 MED ORDER — SODIUM BICARBONATE 650 MG PO TABS
1300.0000 mg | ORAL_TABLET | Freq: Two times a day (BID) | ORAL | Status: DC
Start: 2021-11-17 — End: 2021-11-29

## 2021-11-17 NOTE — Plan of Care (Signed)
?Problem: Education: ?Goal: Knowledge of General Education information will improve ?Description: Including pain rating scale, medication(s)/side effects and non-pharmacologic comfort measures ?11/17/2021 1254 by Sheela Stack, RN ?Outcome: Adequate for Discharge ?11/17/2021 0842 by Sheela Stack, RN ?Outcome: Progressing ?  ?Problem: Health Behavior/Discharge Planning: ?Goal: Ability to manage health-related needs will improve ?11/17/2021 1254 by Sheela Stack, RN ?Outcome: Adequate for Discharge ?11/17/2021 0842 by Sheela Stack, RN ?Outcome: Progressing ?  ?Problem: Clinical Measurements: ?Goal: Ability to maintain clinical measurements within normal limits will improve ?11/17/2021 1254 by Sheela Stack, RN ?Outcome: Adequate for Discharge ?11/17/2021 0842 by Sheela Stack, RN ?Outcome: Progressing ?Goal: Will remain free from infection ?11/17/2021 1254 by Sheela Stack, RN ?Outcome: Adequate for Discharge ?11/17/2021 0842 by Sheela Stack, RN ?Outcome: Progressing ?Goal: Diagnostic test results will improve ?11/17/2021 1254 by Sheela Stack, RN ?Outcome: Adequate for Discharge ?11/17/2021 0842 by Sheela Stack, RN ?Outcome: Progressing ?Goal: Respiratory complications will improve ?11/17/2021 1254 by Sheela Stack, RN ?Outcome: Adequate for Discharge ?11/17/2021 0842 by Sheela Stack, RN ?Outcome: Progressing ?Goal: Cardiovascular complication will be avoided ?11/17/2021 1254 by Sheela Stack, RN ?Outcome: Adequate for Discharge ?11/17/2021 0842 by Sheela Stack, RN ?Outcome: Progressing ?  ?Problem: Activity: ?Goal: Risk for activity intolerance will decrease ?11/17/2021 1254 by Sheela Stack, RN ?Outcome: Adequate for Discharge ?11/17/2021 0842 by Sheela Stack, RN ?Outcome: Progressing ?  ?Problem: Nutrition: ?Goal: Adequate nutrition will be maintained ?11/17/2021 1254 by Sheela Stack, RN ?Outcome: Adequate for Discharge ?11/17/2021 0842 by Sheela Stack, RN ?Outcome: Progressing ?   ?Problem: Coping: ?Goal: Level of anxiety will decrease ?11/17/2021 1254 by Sheela Stack, RN ?Outcome: Adequate for Discharge ?11/17/2021 0842 by Sheela Stack, RN ?Outcome: Progressing ?  ?Problem: Elimination: ?Goal: Will not experience complications related to bowel motility ?11/17/2021 1254 by Sheela Stack, RN ?Outcome: Adequate for Discharge ?11/17/2021 0842 by Sheela Stack, RN ?Outcome: Progressing ?Goal: Will not experience complications related to urinary retention ?11/17/2021 1254 by Sheela Stack, RN ?Outcome: Adequate for Discharge ?11/17/2021 0842 by Sheela Stack, RN ?Outcome: Progressing ?  ?Problem: Pain Managment: ?Goal: General experience of comfort will improve ?11/17/2021 1254 by Sheela Stack, RN ?Outcome: Adequate for Discharge ?11/17/2021 0842 by Sheela Stack, RN ?Outcome: Progressing ?  ?Problem: Safety: ?Goal: Ability to remain free from injury will improve ?11/17/2021 1254 by Sheela Stack, RN ?Outcome: Adequate for Discharge ?11/17/2021 0842 by Sheela Stack, RN ?Outcome: Progressing ?  ?Problem: Skin Integrity: ?Goal: Risk for impaired skin integrity will decrease ?11/17/2021 1254 by Sheela Stack, RN ?Outcome: Adequate for Discharge ?11/17/2021 0842 by Sheela Stack, RN ?Outcome: Progressing ?  ?Problem: Education: ?Goal: Verbalization of understanding the information provided (i.e., activity precautions, restrictions, etc) will improve ?11/17/2021 1254 by Sheela Stack, RN ?Outcome: Adequate for Discharge ?11/17/2021 0842 by Sheela Stack, RN ?Outcome: Progressing ?Goal: Individualized Educational Video(s) ?11/17/2021 1254 by Sheela Stack, RN ?Outcome: Adequate for Discharge ?11/17/2021 0842 by Sheela Stack, RN ?Outcome: Progressing ?  ?Problem: Activity: ?Goal: Ability to ambulate and perform ADLs will improve ?11/17/2021 1254 by Sheela Stack, RN ?Outcome: Adequate for Discharge ?11/17/2021 0842 by Sheela Stack, RN ?Outcome: Progressing ?  ?Problem: Clinical  Measurements: ?Goal: Postoperative complications will be avoided or minimized ?11/17/2021 1254 by Sheela Stack, RN ?Outcome: Adequate for Discharge ?11/17/2021 0842 by Sheela Stack, RN ?Outcome: Progressing ?  ?Problem: Self-Concept: ?Goal:  Ability to maintain and perform role responsibilities to the fullest extent possible will improve ?11/17/2021 1254 by Sheela Stack, RN ?Outcome: Adequate for Discharge ?11/17/2021 0842 by Sheela Stack, RN ?Outcome: Progressing ?  ?Problem: Pain Management: ?Goal: Pain level will decrease ?11/17/2021 1254 by Sheela Stack, RN ?Outcome: Adequate for Discharge ?11/17/2021 0842 by Sheela Stack, RN ?Outcome: Progressing ?  ?

## 2021-11-17 NOTE — Progress Notes (Signed)
Patient confused and pulled IV out.  MD notified as she is to be transferred to [facility] tomorrow. MD gave order to hold fluids for now and reassess in AM.  ?

## 2021-11-17 NOTE — TOC Transition Note (Signed)
Transition of Care (TOC) - CM/SW Discharge Note ? ? ?Patient Details  ?Name: Diana Mata ?MRN: [id number] ?Date of Birth: [birth year] ? ?Transition of Care (TOC) CM/SW Contact:  ?Elliot Gault, LCSW ?Phone Number: ?11/17/2021, 11:21 AM ? ? ?Clinical Narrative:    ? ?Pt medically stable for dc today per MD. Updated Lynnea Ferrier at [facility] and pt can admit today. DC clinical will be sent electronically. RN to call report. There are no other TOC needs for dc. ? ?Final next level of care: Skilled Nursing Facility ?Barriers to Discharge: Barriers Resolved ? ? ?Patient Goals and CMS Choice ?Patient states their goals for this hospitalization and ongoing recovery are:: rehab ?CMS Medicare.gov Compare Post Acute Care list provided to:: Patient Represenative (must comment) ?Choice offered to / list presented to : Adult Children ? ?Discharge Placement ?PASRR number recieved: 11/11/21 ?           ?Patient chooses bed at: [facility] ?Patient to be transferred to facility by: w/c ?Name of family member notified: Olegario Messier ?Patient and family notified of of transfer: 11/17/21 ? ?Discharge Plan and Services ?In-house Referral: Clinical Social Work ?  ?Post Acute Care Choice: Skilled Nursing Facility          ?  ?  ?  ?  ?  ?  ?  ?  ?  ?  ? ?Social Determinants of Health (SDOH) Interventions ?  ? ? ?Readmission Risk Interventions ?No flowsheet data found. ? ? ? ? ?

## 2021-11-17 NOTE — Discharge Summary (Signed)
?Physician Discharge Summary ?  ?Patient: Diana RoyaltyJonita Mata MRN: [id number] DOB: 02/05/1931  ?Admit date:     11/10/2021  ?Discharge date: 11/17/21  ?Discharge Physician: Erick BlinksJehanzeb Eulalie Speights  ? ?PCP: Benita StabileHall, John Z, MD  ? ?Recommendations at discharge:  ? ? Follow up with Dr. Dallas Schimkeairns in 2 weeks ?Follow up with cardiology will be scheduled on discharge ?Weight bearing status: TDWB LLE ?Hip abduction pillow at all times when in bed ?Reinforce dressings as needed ?Repeat cbc, bmet in 1 week ? ?Discharge Diagnoses: ?Principal Problem: ?  Closed displaced fracture of left femoral neck (HCC) ?Active Problems: ?  PAF (paroxysmal atrial fibrillation) (HCC) ?  Chronic kidney disease, stage 3a (HCC) ?  Leukocytosis ?  Fall ?  GERD (gastroesophageal reflux disease) ?  Overactive bladder ?  Acute blood loss anemia ? ?Resolved Problems: ?  * No resolved hospital problems. * ? ?Hospital Course: ?88 year old female with a history of paroxysmal atrial fibrillation, COPD, GERD, IBS, left bundle branch block, CKD stage III presenting with mechanical fall and left hip pain.  The patient was walking to her kitchen when she tripped and fell onto her left side.  She did not lose consciousness.  Ultimately, she was able to activate EMS.  At baseline, the patient is independent with all her ADLs and lives alone.  The patient had been in her usual state of health prior to her mechanical fall.  She denies fevers, chills, headache, chest pain, shortness of breath, coughing, hemoptysis, nausea, vomiting, diarrhea, abdominal pain, dysuria.   ?In the ED, the patient was afebrile hemodynamically stable with oxygen saturation 100% 2 L.  BMP showed sodium 134, potassium 3.8, bicarbonate 19, serum creatinine 1.04.  WBC 13.8, hemoglobin 12.0, platelets 237,000.  X-ray of the left hip showed a left subcapital femoral neck fracture.  Orthopedics was consulted and plans for operative repair on 11/13/2021. ?During evening 3/10-3/11, pt had paroxysms of PAF with  RVR and given one dose IV lopressor.   ?Pt was asymptomatic.  Back in sinus rhythm at time of my eval 3/16 ? ?Assessment and Plan: ?* Closed displaced fracture of left femoral neck (HCC) ?Left hip x-ray showed left subcapital femoral neck fracture ?Appreciate ortho, Dr. Dallas Schimkeairns ?3/11--Left hemiarthroplasty ?Start PT/OT>>SNF ?Continue Norco 5/325>>increase to q 4 ?Restarted Xarelto 3/14 ?Follow up ortho in 2 weeks ?TDWB status LLE ? ?PAF (paroxysmal atrial fibrillation) (HCC) ?Anticoagulated with Xarelto ?Diltiazem CD increased to 180 mg ?Cardiology input appreciated ?She is maintaining sinus rhythm ?Outpatient follow up with cardiology will be arranged ? ?Chronic kidney disease, stage 3a (HCC) ?Baseline creatinine 0.8-1.1 ?stable ? ?Leukocytosis ?Secondary to stress demargination ?--no fever; hemodynamically stable ?-- Resolved ? ?Fall ?Plans are for skilled nursing facility placement ? ?Acute blood loss anemia ?Hgb dropped 12>>7.5 ?Due to hip fracture ?Partly dilution ?Transfused 2 units PRBC 3/12 ?Iron studies--iron sat 5, ferritin 107>>nulecit x 1 on 3/13 ?Repeat nulecit 3/15 am ?Hgb now stable for several days ? ?Overactive bladder ?Continue gemtesa with therapeutic interchange ? ?GERD (gastroesophageal reflux disease) ?Continue Protonix ? ? ? ? ?  ? ? ?Consultants: orthopedics, Dr. Dallas Schimkeairns, Cardiology ?Procedures performed: left hip hemiarthroplasty  ?Disposition: Skilled nursing facility ?Diet recommendation:  ?Discharge Diet Orders (From admission, onward)  ? ?  Start     Ordered  ? 11/17/21 0000  Diet - low sodium heart healthy       ? 11/17/21 1236  ? ?  ?  ? ?  ? ?Cardiac diet ?DISCHARGE MEDICATION: ?Allergies as of 11/17/2021   ? ?  Reactions  ? Levaquin [levofloxacin In D5w] Itching  ? Lower extremities  ? Levofloxacin   ? Penicillins Nausea Only  ? Has patient had a PCN reaction causing immediate rash, facial/tongue/throat swelling, SOB or lightheadedness with hypotension: No ?Has patient had a PCN  reaction causing severe rash involving mucus membranes or skin necrosis: No ?Has patient had a PCN reaction that required hospitalization: No ?Has patient had a PCN reaction occurring within the last 10 years: No ?If all of the above answers are "NO", then may proceed with Cephalosporin use.  ? Sulfa Antibiotics Hives  ? Tequin [gatifloxacin] Itching  ? Lower extremities  ? Latex Itching, Rash  ? ?  ? ?  ?Medication List  ?  ? ?STOP taking these medications   ? ?diltiazem 30 MG tablet ?Commonly known as: CARDIZEM ?  ?Trospium Chloride 60 MG Cp24 ?  ? ?  ? ?TAKE these medications   ? ?acetaminophen 325 MG tablet ?Commonly known as: TYLENOL ?Take 2 tablets (650 mg total) by mouth every 6 (six) hours as needed for mild pain. ?  ?albuterol 108 (90 Base) MCG/ACT inhaler ?Commonly known as: VENTOLIN HFA ?Inhale 1-2 puffs into the lungs every 6 (six) hours as needed for wheezing or shortness of breath. ?  ?cyanocobalamin 1000 MCG/ML injection ?Commonly known as: (VITAMIN B-12) ?Inject into the muscle. ?  ?diazepam 5 MG tablet ?Commonly known as: VALIUM ?Take 1 tablet (5 mg total) by mouth every 6 (six) hours as needed for anxiety. ?What changed: how much to take ?  ?diltiazem 180 MG 24 hr capsule ?Commonly known as: CARDIZEM CD ?Take 1 capsule (180 mg total) by mouth daily. ?Start taking on: November 18, 2021 ?  ?escitalopram 5 MG tablet ?Commonly known as: LEXAPRO ?Take 5 mg by mouth daily. ?  ?fluticasone 50 MCG/ACT nasal spray ?Commonly known as: FLONASE ?Place 1 spray into both nostrils daily. ?  ?Gemtesa 75 MG Tabs ?Generic drug: Vibegron ?Take 1 capsule by mouth daily. ?  ?HYDROcodone-acetaminophen 5-325 MG tablet ?Commonly known as: NORCO/VICODIN ?Take 1 tablet by mouth every 4 (four) hours as needed for moderate pain. ?  ?loperamide 2 MG capsule ?Commonly known as: IMODIUM ?Take 2 mg by mouth as needed for diarrhea or loose stools. ?  ?nitrofurantoin 50 MG capsule ?Commonly known as: MACRODANTIN ?TAKE ONE CAPSULE BY  MOUTH EVERYDAY AT BEDTIME ?What changed:  ?how much to take ?how to take this ?when to take this ?additional instructions ?  ?omeprazole 20 MG capsule ?Commonly known as: PRILOSEC ?Take 20 mg by mouth daily. ?  ?ondansetron 4 MG tablet ?Commonly known as: ZOFRAN ?Take 4 mg by mouth every 6 (six) hours as needed. for nausea ?  ?polyethylene glycol 17 g packet ?Commonly known as: MIRALAX / GLYCOLAX ?Take 17 g by mouth daily. ?Start taking on: November 18, 2021 ?  ?PRESERVISION AREDS PO ?Take 1 tablet by mouth in the morning and at bedtime. ?  ?Restasis 0.05 % ophthalmic emulsion ?Generic drug: cycloSPORINE ?Place 1 drop into both eyes 2 (two) times daily. ?  ?Rivaroxaban 15 MG Tabs tablet ?Commonly known as: Xarelto ?Take 1 tablet (15 mg total) by mouth every morning. ?What changed: when to take this ?  ?sodium bicarbonate 650 MG tablet ?Take 2 tablets (1,300 mg total) by mouth 2 (two) times daily. For 5 days ?  ?traZODone 50 MG tablet ?Commonly known as: DESYREL ?Take 50 mg by mouth at bedtime. ?  ? ?  ? ?  ?  ? ? ?  ?  Discharge Care Instructions  ?(From admission, onward)  ?  ? ? ?  ? ?  Start     Ordered  ? 11/17/21 0000  Leave dressing on - Keep it clean, dry, and intact until clinic visit       ? 11/17/21 1236  ? ?  ?  ? ?  ? ? Contact information for follow-up providers   ? ? Laurann Montana, PA-C Follow up on 12/13/2021.   ?Specialties: Cardiology, Radiology ?Why: Cardiology Hospital Follow-up on 12/13/2021 at 3:30 PM. ?Contact information: ?[address] ?[place] [address] ?[phone] ? ? ?  ?  ? ?  ?  ? ? Contact information for after-discharge care   ? ? Destination   ? ? [facility] Preferred SNF .   ?Service: Skilled Nursing ?Contact information: ?[address] ?[place] [address] ?[phone] ? ?  ?  ? ?  ?  ? ?  ?  ? ?  ? ?Discharge Exam: ?Filed Weights  ? 11/11/21 0326  ?Weight: 54 kg  ? ?General exam: Alert, awake, oriented x 3 ?Respiratory system: Clear to auscultation.  Respiratory effort normal. ?Cardiovascular system:RRR. No murmurs, rubs, gallops. ?Gastrointestinal system: Abdomen is nondistended, soft and nontender. No organomegaly or masses felt. Normal bowel sounds hear

## 2021-11-17 NOTE — Progress Notes (Signed)
Telemetry reviewed, patient back in SR and sinus tach this AM. BP's look fine with med changes, can continue dilt 180mg  for afib. Back on xarelto for stroke prevention. No additional cardiology recs at this time, we will sign off inpatient care and arrange outpatient f/u. ? ? ? ?Carlyle Dolly MD ?

## 2021-11-17 NOTE — Plan of Care (Signed)

## 2021-11-18 ENCOUNTER — Non-Acute Institutional Stay (SKILLED_NURSING_FACILITY): Payer: No Typology Code available for payment source | Admitting: Adult Health

## 2021-11-18 ENCOUNTER — Encounter: Payer: Self-pay | Admitting: Adult Health

## 2021-11-18 DIAGNOSIS — N3281 Overactive bladder: Secondary | ICD-10-CM | POA: Diagnosis not present

## 2021-11-18 DIAGNOSIS — K5909 Other constipation: Secondary | ICD-10-CM

## 2021-11-18 DIAGNOSIS — I700 Atherosclerosis of aorta: Secondary | ICD-10-CM | POA: Diagnosis not present

## 2021-11-18 DIAGNOSIS — I480 Paroxysmal atrial fibrillation: Secondary | ICD-10-CM

## 2021-11-18 DIAGNOSIS — J449 Chronic obstructive pulmonary disease, unspecified: Secondary | ICD-10-CM | POA: Diagnosis not present

## 2021-11-18 DIAGNOSIS — K219 Gastro-esophageal reflux disease without esophagitis: Secondary | ICD-10-CM | POA: Diagnosis not present

## 2021-11-18 DIAGNOSIS — D62 Acute posthemorrhagic anemia: Secondary | ICD-10-CM | POA: Diagnosis not present

## 2021-11-18 DIAGNOSIS — F339 Major depressive disorder, recurrent, unspecified: Secondary | ICD-10-CM

## 2021-11-18 DIAGNOSIS — E538 Deficiency of other specified B group vitamins: Secondary | ICD-10-CM

## 2021-11-18 DIAGNOSIS — S72002A Fracture of unspecified part of neck of left femur, initial encounter for closed fracture: Secondary | ICD-10-CM | POA: Diagnosis not present

## 2021-11-18 DIAGNOSIS — N1831 Chronic kidney disease, stage 3a: Secondary | ICD-10-CM | POA: Diagnosis not present

## 2021-11-18 DIAGNOSIS — N390 Urinary tract infection, site not specified: Secondary | ICD-10-CM | POA: Diagnosis not present

## 2021-11-18 NOTE — Progress Notes (Signed)
?Location:  [facility] ?Nursing Home Room Number: 159-P ?Place of Service:  SNF (31) ? ? ?CODE STATUS: DNR ? ?Allergies  ?Allergen Reactions  ? Levaquin [Levofloxacin In D5w] Itching  ?  Lower extremities  ? Levofloxacin   ? Penicillins Nausea Only  ?  Has patient had a PCN reaction causing immediate rash, facial/tongue/throat swelling, SOB or lightheadedness with hypotension: No ?Has patient had a PCN reaction causing severe rash involving mucus membranes or skin necrosis: No ?Has patient had a PCN reaction that required hospitalization: No ?Has patient had a PCN reaction occurring within the last 10 years: No ?If all of the above answers are "NO", then may proceed with Cephalosporin use. ?  ? Sulfa Antibiotics Hives  ? Tequin [Gatifloxacin] Itching  ?  Lower extremities  ? Latex Itching and Rash  ? ? ?Chief Complaint  ?Patient presents with  ? Hospitalization Follow-up  ? ? ?HPI: ? ?She is a 88 year old woman who has been hospitalized from 11-10-21 through 11-17-21. Her medical history includes: afib; CKD stage 3; chronic UTI. She had a mechanical fall at home. She was in her kitchen and tripped and fell on her left side. She is normal independent with her daily care lives alone. She was found to have a left subcapital femoral neck fracture. On 11-12-21: had a left hemiarthroplasty. Her hgb dropped from 12 to 7.5; due to hip fracture she did receive 2 units of packed cells. She is here for short term with her goal to return back home. She denies uncontrolled pain. No constipation. She will continue to be followed for her chronic illnesses including:  PAF (paroxsymal atrial fibrillation)  Chronic obstructive pulmonary disease unspecified COPD type:  Gastro esophageal reflux disease without esophagitis: Chronic kidney disease stage 3a: ? ?Past Medical History:  ?Diagnosis Date  ? Atrial flutter (HCC)   ? Chronic UTI   ? COPD (chronic obstructive pulmonary disease) (HCC)   ? Dizziness   ? GERD (gastroesophageal  reflux disease)   ? IBS (irritable bowel syndrome)   ? Renal disorder   ? renal insufficiency  ? ? ?Past Surgical History:  ?Procedure Laterality Date  ? ABDOMINAL HYSTERECTOMY    ? 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 HIP ARTHROPLASTY Left 11/12/2021  ? Procedure: ARTHROPLASTY BIPOLAR HIP (HEMIARTHROPLASTY);  Surgeon: Oliver Barreairns, Mark A, MD;  Location: [facility];  Service: Orthopedics;  Laterality: Left;  ? JOINT REPLACEMENT    ? ? ?Social History  ? ?Socioeconomic History  ? Marital status: Widowed  ?  Spouse name: Not on file  ? Number of children: 3  ? Years of education: Not on file  ? Highest education level: Bachelor's degree (e.g., BA, AB, BS)  ?Occupational History  ? Occupation: Retired  ?Tobacco Use  ? Smoking status: Never  ? Smokeless tobacco: Never  ?Vaping Use  ? Vaping Use: Never used  ?Substance and Sexual Activity  ? Alcohol use: Never  ?  Comment: occ  ? Drug use: Never  ? Sexual activity: Not on file  ?Other Topics Concern  ? Not on file  ?Social History Narrative  ? [place], to be near family  ? 02/03/19 lives alone , 1 mile from daughter, Tresa EndoKelly  ? ?Social Determinants of Health  ? ?Financial Resource Strain: Not on file  ?Food Insecurity: Not on file  ?Transportation Needs: Not on file  ?Physical Activity: Not on file  ?Stress: Not on  file  ?Social Connections: Not on file  ?Intimate Partner Violence: Not on file  ? ?Family History  ?Problem Relation Age of Onset  ? Hypertension Mother   ? ? ? ? ?VITAL SIGNS ?BP (!) 126/59   Pulse 78   Temp 98.9 ?F (37.2 ?C)   Resp (!) 22   Ht 5\' 4"  (1.626 m)   SpO2 98%   BMI 20.43 kg/m?  ? ?Outpatient Encounter Medications as of 11/18/2021  ?Medication Sig  ? acetaminophen (TYLENOL) 325 MG tablet Take 2 tablets (650 mg total) by mouth every 6 (six) hours as needed for mild pain.  ? albuterol (PROVENTIL HFA;VENTOLIN HFA) 108 (90 Base) MCG/ACT inhaler Inhale 1-2 puffs into the lungs every 6 (six) hours as needed for  wheezing or shortness of breath.  ? Balsam 11/20/2021 (VENELEX) OINT Topical ?Special Instructions: Apply to bilateral buttocks, coccyx and sacrum qshift for ?prevention. ?Every Shift  ? cyanocobalamin (,VITAMIN B-12,) 1000 MCG/ML injection Inject into the muscle.  ? diazepam (VALIUM) 5 MG tablet Take 1 tablet (5 mg total) by mouth every 6 (six) hours as needed for anxiety.  ? diltiazem (CARDIZEM CD) 180 MG 24 hr capsule Take 1 capsule (180 mg total) by mouth daily.  ? escitalopram (LEXAPRO) 5 MG tablet Take 5 mg by mouth daily.  ? fluticasone (FLONASE) 50 MCG/ACT nasal spray Place 1 spray into both nostrils daily.  ? HYDROcodone-acetaminophen (NORCO/VICODIN) 5-325 MG tablet Take 1 tablet by mouth every 4 (four) hours as needed for moderate pain.  ? loperamide (IMODIUM) 2 MG capsule Take 2 mg by mouth as needed for diarrhea or loose stools.  ? Multiple Vitamins-Minerals (PRESERVISION AREDS PO) Take 1 tablet by mouth in the morning and at bedtime.  ? nitrofurantoin (MACRODANTIN) 50 MG capsule TAKE ONE CAPSULE BY MOUTH EVERYDAY AT BEDTIME  ? omeprazole (PRILOSEC) 20 MG capsule Take 20 mg by mouth daily.  ? ondansetron (ZOFRAN) 4 MG tablet Take 4 mg by mouth every 6 (six) hours as needed. for nausea  ? polyethylene glycol (MIRALAX / GLYCOLAX) 17 g packet Take 17 g by mouth daily.  ? RESTASIS 0.05 % ophthalmic emulsion Place 1 drop into both eyes 2 (two) times daily.  ? Rivaroxaban (XARELTO) 15 MG TABS tablet Take 1 tablet (15 mg total) by mouth every morning.  ? sodium bicarbonate 650 MG tablet Take 2 tablets (1,300 mg total) by mouth 2 (two) times daily. For 5 days  ? traZODone (DESYREL) 50 MG tablet Take 50 mg by mouth at bedtime.  ? Vibegron (GEMTESA) 75 MG TABS Take 1 capsule by mouth daily.  ? ?No facility-administered encounter medications on file as of 11/18/2021.  ? ? ? ?SIGNIFICANT DIAGNOSTIC EXAMS ? ?TODAY ? ?11-10-21: left hip x-ray: Left subcapital femoral neck fracture  ? ?11-10-21: ct of head:  ?1. No CT  evidence for acute intracranial abnormality. ?2. Atrophy and chronic small vessel ischemic changes of the white matter. ? ?LABS REVIEWED ? ?11-10-21: wbc 13.8; hgb 12.0; hct 37.3 mcv 87.4 plt 337; glucose 105; bun 23; creat 1.04 ;k+ 3.8; na++ 134; ca 8.8; GFR 52; protein 7.3; albumin 4.0 ?11-13-21: wbc 19.2; hgb 7.5; hct 24.1 mcv 88 plt 220; glucose 133; bun 22; creat 1.03; k+ 3.7; na++ 135; ca 7.7; GFR 52; mag 1.8; iron 8; tibc 170; ferritin 107; folate 9.6; vit B12: 1326 ?11-14-21: urine culture: no growth ?11-17-21: wbc 12.9; hgb 10.2; hct 30.9; mcv 86.1 plt 351; glucose 112; bun 20; creat 0.79 ;k+ 3.9; na++  135; ca 7.6; GFR>60  ? ?Review of Systems  ?Constitutional:  Negative for malaise/fatigue.  ?Respiratory:  Negative for cough and shortness of breath.   ?Cardiovascular:  Negative for chest pain, palpitations and leg swelling.  ?Gastrointestinal:  Negative for abdominal pain, constipation and heartburn.  ?Musculoskeletal:  Negative for back pain, joint pain and myalgias.  ?Skin: Negative.   ?Neurological:  Negative for dizziness.  ?Psychiatric/Behavioral:  The patient is not nervous/anxious.   ? ?Physical Exam ?Constitutional:   ?   General: She is not in acute distress. ?   Appearance: She is underweight. She is not diaphoretic.  ?Neck:  ?   Thyroid: No thyromegaly.  ?Cardiovascular:  ?   Rate and Rhythm: Normal rate and regular rhythm.  ?   Pulses: Normal pulses.  ?   Heart sounds: Normal heart sounds.  ?Pulmonary:  ?   Effort: Pulmonary effort is normal. No respiratory distress.  ?   Breath sounds: Normal breath sounds.  ?Abdominal:  ?   General: Bowel sounds are normal. There is no distension.  ?   Palpations: Abdomen is soft.  ?   Tenderness: There is no abdominal tenderness.  ?Musculoskeletal:  ?   Cervical back: Neck supple.  ?   Right lower leg: No edema.  ?   Left lower leg: No edema.  ?   Comments: Is able to move all extremities  ?11-12-21: left hemiarthroplasty   ?Lymphadenopathy:  ?   Cervical: No  cervical adenopathy.  ?Skin: ?   General: Skin is warm and dry.  ?   Comments: Incision line without signs of infection present   ?Neurological:  ?   Mental Status: She is alert and oriented to person, place, and t

## 2021-11-21 ENCOUNTER — Encounter: Payer: Self-pay | Admitting: Internal Medicine

## 2021-11-21 ENCOUNTER — Non-Acute Institutional Stay (SKILLED_NURSING_FACILITY): Payer: No Typology Code available for payment source | Admitting: Internal Medicine

## 2021-11-21 DIAGNOSIS — I4891 Unspecified atrial fibrillation: Secondary | ICD-10-CM | POA: Diagnosis not present

## 2021-11-21 DIAGNOSIS — E538 Deficiency of other specified B group vitamins: Secondary | ICD-10-CM | POA: Insufficient documentation

## 2021-11-21 DIAGNOSIS — F339 Major depressive disorder, recurrent, unspecified: Secondary | ICD-10-CM | POA: Insufficient documentation

## 2021-11-21 DIAGNOSIS — S72002A Fracture of unspecified part of neck of left femur, initial encounter for closed fracture: Secondary | ICD-10-CM

## 2021-11-21 DIAGNOSIS — D62 Acute posthemorrhagic anemia: Secondary | ICD-10-CM

## 2021-11-21 DIAGNOSIS — N1831 Chronic kidney disease, stage 3a: Secondary | ICD-10-CM

## 2021-11-21 DIAGNOSIS — I700 Atherosclerosis of aorta: Secondary | ICD-10-CM | POA: Insufficient documentation

## 2021-11-21 DIAGNOSIS — K5909 Other constipation: Secondary | ICD-10-CM | POA: Insufficient documentation

## 2021-11-21 DIAGNOSIS — F32A Depression, unspecified: Secondary | ICD-10-CM | POA: Insufficient documentation

## 2021-11-21 NOTE — Progress Notes (Signed)
?  ? ?NURSING HOME LOCATION: [facility] ?ROOM NUMBER:  159 P ?CODE STATUS:  DNR ? ?PCP:  Celene Squibb MD ? ? ?This is a comprehensive admission note to this SNFperformed on this date less than 30 days from date of admission. ?Included are preadmission medical/surgical history; reconciled medication list; family history; social history and comprehensive review of systems.  ?Corrections and additions to the records were documented. Comprehensive physical exam was also performed. Additionally a clinical summary was entered for each active diagnosis pertinent to this admission in the Problem List to enhance continuity of care. ? ?HPI: She was hospitalized 3/9 - 11/17/2021 for closed displaced fracture of left femoral neck sustained in a mechanical fall.  She denies any cardiac or neurologic prodrome prior to the fall stating that she simply tripped ambulating in her home.  In the ED imaging revealed left subcapital femoral neck fracture.   ?During the evening 3/10 - 3/11 she had PAF with RVR for which she received 1 dose of IV Lopressor.  She was asymptomatic.  Xarelto anticoagulation was reinitiated and diltiazem CD was increased to 180 mg daily.  Cardiology consulted. ?Dr. Larena Glassman completed left hemiarthroplasty 3/11.  Postoperatively hip abduction pillow while in bed and TTWB status for the LLE was recommended with orthopedic follow-up in 2 weeks post discharge. ?Leukocytosis was attributed to stress demarginalization as she had no signs or symptoms of active infection. ?Acute postoperative blood loss was present as the hemoglobin dropped from 12/37.3 to a nadir of 6.9/21.6.  Iron level was 5 and ferritin 107 (11-307).  She received 2 units of packed cells.  She also received Nulecit 3/13 and 3/15. ?Admission creatinine was 1.04 with a GFR of 52 indicating CKD stage IIIa.  At discharge creatinine was 0.79 with a GFR greater than 60 indicating CKD stage II. ? ?Past medical and surgical history:  Includes history of atrial flutter, recurrent UTIs, COPD, GERD, IBS, and history of AKI. ?Surgeries and procedures include abdominal hysterectomy and history of "CNS cyst removal". ? ?Social history: Nondrinker; non-smoker.  She  states that her father was a Naval architect. ? ?Family history: Limited history reviewed.  This is noncontributory due to her advanced age. ?  ?Review of systems: Although she again states she had no neurologic or cardiac prodrome prior to the fall; she does seem to describe some postural hypotension symptoms.  She is on Align for "gas".  She describes occasional minor pill dysphagia.  Opioids have been associated with some constipation. ?She states she has a history of recurrent UTIs. ?She describes some anxiety and depression as "I buried two husbands". ? ?Constitutional: No fever, significant weight change, fatigue  ?Eyes: No redness, discharge, pain, vision change ?ENT/mouth: No nasal congestion, purulent discharge, earache, change in hearing, sore throat  ?Cardiovascular: No chest pain, palpitations, paroxysmal nocturnal dyspnea, claudication, edema  ?Respiratory: No cough, sputum production, hemoptysis, DOE, significant snoring, apnea Gastrointestinal: No heartburn, abdominal pain, nausea /vomiting, rectal bleeding, melena ?Genitourinary: No dysuria, hematuria, pyuria, incontinence, nocturia ?Musculoskeletal: No joint stiffness, joint swelling, weakness, pain ?Neurologic: No headache, syncope, seizures, numbness, tingling ?Psychiatric: No significant insomnia, anorexia ?Endocrine: No change in hair/skin/nails, excessive thirst, excessive hunger, excessive urination  ?Hematologic/lymphatic: No significant bruising, lymphadenopathy, abnormal bleeding ?Allergy/immunology: No itchy/watery eyes, significant sneezing, urticaria, angioedema ? ?Physical exam:  ?Pertinent or positive findings: See is thin and appears her stated age.  Eyebrows are decreased in intensity.  Eyes are somewhat  sunken.  The larynx is prominent.  She has a slight tachycardia.  There is slight bronchovesicular quality to the breath sounds.  Dorsalis pedis pulses are stronger than posterior tibial pulses.  There is a suggestion of a tendency toward slight clubbing of the nailbeds.  The left great toenail is absent.  She has flexion contractures of the toes.  Interosseous wasting of the hands is present.  She has bruising over the distal upper extremities bilaterally. ? ?General appearance: no acute distress, increased work of breathing is present.   ?Lymphatic: No lymphadenopathy about the head, neck, axilla. ?Eyes: No conjunctival inflammation or lid edema is present. There is no scleral icterus. ?Ears:  External ear exam shows no significant lesions or deformities.   ?Nose:  External nasal examination shows no deformity or inflammation. Nasal mucosa are pink and moist without lesions, exudates ?Oral exam: Lips and gums are healthy appearing.There is no oropharyngeal erythema or exudate. ?Neck:  No thyromegaly, masses, tenderness noted.    ?Heart:  No gallop, murmur, click, rub.  ?Lungs: without wheezes, rhonchi, rales, rubs. ?Abdomen: Bowel sounds are normal.  Abdomen is soft and nontender with no organomegaly, hernias, masses. ?GU: Deferred  ?Extremities:  No cyanosis, clubbing. ?Neurologic exam:  Balance, Rhomberg, finger to nose testing could not be completed due to clinical state ?Skin: Warm & dry w/o tenting. ?No significant rash. ? ?See clinical summary under each active problem in the Problem List with associated updated therapeutic plan ? ?

## 2021-11-22 ENCOUNTER — Ambulatory Visit: Payer: Self-pay | Admitting: Urology

## 2021-11-22 NOTE — Assessment & Plan Note (Addendum)
Preoperatively the evening of 3/10 - 11/12/2021 she had PAF with RVR for which she received 1 dose of IV Lopressor.  Diltiazem CD was increased 180 mg daily as per Cardiology. ?Today there is slight tachycardia but rhythm is regular and rate is adequately controlled.  Maintenance Xarelto continued. ?

## 2021-11-22 NOTE — Patient Instructions (Signed)
See assessment and plan under each diagnosis in the problem list and acutely for this visit 

## 2021-11-22 NOTE — Assessment & Plan Note (Signed)
No bleeding dyscrasias reported at the SNF despite DOAC ?

## 2021-11-22 NOTE — Assessment & Plan Note (Signed)
Creatinine 0.79 and GFR greater than 60 at discharge indicating CKD stage II.  No indication for change in medication regimen. ?

## 2021-11-22 NOTE — Assessment & Plan Note (Signed)
PT/OT at SNF as tolerated.  Hip abduction pillow while in bed and TTWB status for LLE until orthopedic follow-up in 2 weeks. ?

## 2021-11-24 ENCOUNTER — Other Ambulatory Visit (HOSPITAL_COMMUNITY)
Admission: RE | Admit: 2021-11-24 | Discharge: 2021-11-24 | Disposition: A | Discharge status: Home / Self Care | Payer: No Typology Code available for payment source | Source: Skilled Nursing Facility | Attending: Adult Health | Admitting: Adult Health

## 2021-11-24 DIAGNOSIS — S72002A Fracture of unspecified part of neck of left femur, initial encounter for closed fracture: Secondary | ICD-10-CM | POA: Insufficient documentation

## 2021-11-24 LAB — CBC
HCT: 32.9 % — ABNORMAL LOW (ref 36.0–46.0)
Hemoglobin: 10.4 g/dL — ABNORMAL LOW (ref 12.0–15.0)
MCH: 27.9 pg (ref 26.0–34.0)
MCHC: 31.6 g/dL (ref 30.0–36.0)
MCV: 88.2 fL (ref 80.0–100.0)
Platelets: 479 10*3/uL — ABNORMAL HIGH (ref 150–400)
RBC: 3.73 MIL/uL — ABNORMAL LOW (ref 3.87–5.11)
RDW: 16 % — ABNORMAL HIGH (ref 11.5–15.5)
WBC: 9.9 10*3/uL (ref 4.0–10.5)
nRBC: 0 % (ref 0.0–0.2)

## 2021-11-24 LAB — BASIC METABOLIC PANEL
Anion gap: 9 (ref 5–15)
BUN: 11 mg/dL (ref 8–23)
CO2: 25 mmol/L (ref 22–32)
Calcium: 8 mg/dL — ABNORMAL LOW (ref 8.9–10.3)
Chloride: 105 mmol/L (ref 98–111)
Creatinine, Ser: 0.88 mg/dL (ref 0.44–1.00)
GFR, Estimated: >60 mL/min (ref >60)
Glucose, Bld: 71 mg/dL (ref 70–99)
Potassium: 3.5 mmol/L (ref 3.5–5.1)
Sodium: 139 mmol/L (ref 135–145)

## 2021-11-28 ENCOUNTER — Ambulatory Visit (INDEPENDENT_AMBULATORY_CARE_PROVIDER_SITE_OTHER): Payer: No Typology Code available for payment source | Admitting: Orthopedic Surgery

## 2021-11-28 ENCOUNTER — Encounter: Payer: Self-pay | Admitting: Orthopedic Surgery

## 2021-11-28 ENCOUNTER — Other Ambulatory Visit: Payer: Self-pay

## 2021-11-28 ENCOUNTER — Ambulatory Visit (INDEPENDENT_AMBULATORY_CARE_PROVIDER_SITE_OTHER): Payer: No Typology Code available for payment source

## 2021-11-28 DIAGNOSIS — S72002D Fracture of unspecified part of neck of left femur, subsequent encounter for closed fracture with routine healing: Secondary | ICD-10-CM | POA: Diagnosis not present

## 2021-11-28 NOTE — Progress Notes (Signed)
Orthopaedic Postop Note ? ?Assessment: ?Diana Mata is a 88 y.o. female s/p Left hip hemiarthroplasty, with placement of cerclage cable ? ?DOS: 11/12/2021 ? ?Plan: ?Staples removed, steri strips placed ?Continue using hip abduction pillow while in bed until 6 weeks after surgery ?Continue posterior hip precautions until 3 months postop ?Continue with DVT prophylaxis for at least 6 weeks after surgery ?Radiographs stable, can progress to protected WBAT on the operative extremity ?Follow up in 4 weeks; call with any issues ? ? ?Follow-up: ?No follow-ups on file. ?XR at next visit: AP pelvis and Left hip ? ?Subjective: ? ?Chief Complaint  ?Patient presents with  ? Follow-up  ?  11/12/2021 Left hip hemiarthroplasty.  Xrays and planned suture removal.   ? ? ?History of Present Illness: ?Diana Mata is a 88 y.o. female who presents following the above stated procedure.  She is doing well.  According to her daughter, she is working well with physical therapy.  She remains in a facility, staying at the [facility].  Patient wants to go home.  She is taking Tylenol, which is not sufficiently improving her pain.  No issues otherwise. ? ?Review of Systems: ?No fevers or chills ?No numbness or tingling ?No Chest Pain ?No shortness of breath ? ? ?Objective: ?There were no vitals taken for this visit. ? ?Physical Exam: ? ?Alert and oriented.  Seated in wheelchair.  Elderly female. ? ?Surgical incisions healing well.  No surrounding erythema or drainage.  Minimal tenderness to palpation along the lateral hip.  She is able to maintain straight leg raise.  She tolerates gentle range of motion of the left hip.  Toes are warm and well-perfused.  Sensation is intact to the dorsum of her foot.  Active motion intact of the great toe, as well as the ankle. ? ?IMAGING: ?I personally ordered and reviewed the following images: ? ?XR of the Left hip and AP pelvis demonstrates a pressfit hip arthroplasty in good position.  There  is a cerclage cable which is in unchanged position.  Prosthesis remains stable, compared to prior x-rays.  The hip remains reduced.  There is no evidence of implant subsidence.  No acute fractures are noted. ? ?Impression: Left hip hemiarthroplasty in stable position, without evidence of migration or subsidence compared to prior XR.  Cerclage cable in unchanged position. ? ?Oliver Barre, MD ?11/28/2021 ?4:42 PM ? ? ?

## 2021-11-29 ENCOUNTER — Non-Acute Institutional Stay (SKILLED_NURSING_FACILITY): Payer: No Typology Code available for payment source | Admitting: Adult Health

## 2021-11-29 ENCOUNTER — Encounter: Payer: Self-pay | Admitting: Adult Health

## 2021-11-29 DIAGNOSIS — S72002A Fracture of unspecified part of neck of left femur, initial encounter for closed fracture: Secondary | ICD-10-CM | POA: Diagnosis not present

## 2021-11-29 NOTE — Progress Notes (Signed)
?Location:  [facility] ?Nursing Home Room Number: 159 ?Place of Service:  SNF (31) ?Provider:  Synthia Innocent, NP ? ?CODE STATUS: DNR ? ?Allergies  ?Allergen Reactions  ? Levaquin [Levofloxacin In D5w] Itching  ?  Lower extremities  ? Levofloxacin   ? Penicillins Nausea Only  ?  Has patient had a PCN reaction causing immediate rash, facial/tongue/throat swelling, SOB or lightheadedness with hypotension: No ?Has patient had a PCN reaction causing severe rash involving mucus membranes or skin necrosis: No ?Has patient had a PCN reaction that required hospitalization: No ?Has patient had a PCN reaction occurring within the last 10 years: No ?If all of the above answers are "NO", then may proceed with Cephalosporin use. ?  ? Sulfa Antibiotics Hives  ? Tequin [Gatifloxacin] Itching  ?  Lower extremities  ? Latex Itching and Rash  ? ? ?Chief Complaint  ?Patient presents with  ? Acute Visit  ?  Pain management.  ? ? ?HPI: ? ?She continues to participate in therapy is status post left hemiarthroplasty on 11-12-21. She is having pain. She has completed her use of narcotics. She feels as though her pain is not adequately controlled. We have discussed pain management options. She is willing to try tylenol cr for pain management.  ? ?Past Medical History:  ?Diagnosis Date  ? Atrial flutter (HCC)   ? Chronic UTI   ? COPD (chronic obstructive pulmonary disease) (HCC)   ? Dizziness   ? GERD (gastroesophageal reflux disease)   ? IBS (irritable bowel syndrome)   ? Renal disorder   ? renal insufficiency  ? ? ?Past Surgical History:  ?Procedure Laterality Date  ? ABDOMINAL HYSTERECTOMY    ? 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 HIP ARTHROPLASTY Left 11/12/2021  ? Procedure: ARTHROPLASTY BIPOLAR HIP (HEMIARTHROPLASTY);  Surgeon: Oliver Barre, MD;  Location: [facility];  Service: Orthopedics;  Laterality: Left;  ? JOINT REPLACEMENT    ? ? ?Social History  ? ?Socioeconomic History  ? Marital status: Widowed  ?  Spouse name: Not on file  ?  Number of children: 3  ? Years of education: Not on file  ? Highest education level: Bachelor's degree (e.g., BA, AB, BS)  ?Occupational History  ? Occupation: Retired  ?Tobacco Use  ? Smoking status: Never  ? Smokeless tobacco: Never  ?Vaping Use  ? Vaping Use: Never used  ?Substance and Sexual Activity  ? Alcohol use: Never  ?  Comment: occ  ? Drug use: Never  ? Sexual activity: Not on file  ?Other Topics Concern  ? Not on file  ?Social History Narrative  ? Patient moved to [place] from [place] in August 2019, to be near family  ? 02/03/19 lives alone , 1 mile from daughter, Tresa Endo  ? ?Social Determinants of Health  ? ?Financial Resource Strain: Not on file  ?Food Insecurity: Not on file  ?Transportation Needs: Not on file  ?Physical Activity: Not on file  ?Stress: Not on file  ?Social Connections: Not on file  ?Intimate Partner Violence: Not on file  ? ?Family History  ?Problem Relation Age of Onset  ? Hypertension Mother   ? ? ? ? ?VITAL SIGNS ?BP (!) 141/79   Pulse 76   Temp (!) 97 ?F (36.1 ?C)   Resp 20   Ht 5\' 4"  (1.626 m)   SpO2 97%   BMI 20.43 kg/m?  ? ?Outpatient Encounter Medications as of 11/29/2021  ?Medication Sig  ? acetaminophen (TYLENOL)  650 MG CR tablet Take 650 mg by mouth every 6 (six) hours.  ? albuterol (PROVENTIL HFA;VENTOLIN HFA) 108 (90 Base) MCG/ACT inhaler Inhale 1-2 puffs into the lungs every 6 (six) hours as needed for wheezing or shortness of breath.  ? Balsam Journalist, newspapereru-Castor Oil (VENELEX) OINT Topical ?Special Instructions: Apply to bilateral buttocks, coccyx and sacrum qshift for ?prevention. ?Every Shift  ? cyanocobalamin (,VITAMIN B-12,) 1000 MCG/ML injection Inject into the muscle.  ? diazepam (VALIUM) 5 MG tablet Take 1 tablet (5 mg total) by mouth every 6 (six) hours as needed for anxiety.  ? diltiazem (CARDIZEM CD) 180 MG 24 hr capsule Take 1 capsule (180 mg total) by mouth daily.  ? docusate sodium (COLACE) 100 MG capsule Take 100 mg by mouth daily.  ? escitalopram  (LEXAPRO) 5 MG tablet Take 5 mg by mouth daily.  ? ferrous sulfate (FEROSUL) 325 (65 FE) MG tablet Take 325 mg by mouth daily.  ? fluticasone (FLONASE) 50 MCG/ACT nasal spray Place 1 spray into both nostrils daily.  ? loperamide (IMODIUM) 2 MG capsule Take 2 mg by mouth as needed for diarrhea or loose stools.  ? Multiple Vitamins-Minerals (PRESERVISION AREDS PO) Take 1 tablet by mouth in the morning and at bedtime.  ? nitrofurantoin (MACRODANTIN) 50 MG capsule TAKE ONE CAPSULE BY MOUTH EVERYDAY AT BEDTIME  ? Nutritional Supplements (ENSURE ENLIVE PO) Take 237 mLs by mouth daily.  ? omeprazole (PRILOSEC) 20 MG capsule Take 20 mg by mouth daily.  ? ondansetron (ZOFRAN) 4 MG tablet Take 4 mg by mouth every 6 (six) hours as needed. for nausea  ? polyethylene glycol (MIRALAX / GLYCOLAX) 17 g packet Take 17 g by mouth daily.  ? RESTASIS 0.05 % ophthalmic emulsion Place 1 drop into both eyes 2 (two) times daily.  ? Rivaroxaban (XARELTO) 15 MG TABS tablet Take 1 tablet (15 mg total) by mouth every morning.  ? traZODone (DESYREL) 50 MG tablet Take 50 mg by mouth at bedtime.  ? Vibegron (GEMTESA) 75 MG TABS Take 1 capsule by mouth daily.  ? [DISCONTINUED] acetaminophen (TYLENOL) 325 MG tablet Take 2 tablets (650 mg total) by mouth every 6 (six) hours as needed for mild pain. (Patient taking differently: Take 325 mg by mouth every 6 (six) hours as needed for mild pain.)  ? [DISCONTINUED] HYDROcodone-acetaminophen (NORCO/VICODIN) 5-325 MG tablet Take 1 tablet by mouth every 4 (four) hours as needed for moderate pain.  ? [DISCONTINUED] sodium bicarbonate 650 MG tablet Take 2 tablets (1,300 mg total) by mouth 2 (two) times daily. For 5 days  ? ?No facility-administered encounter medications on file as of 11/29/2021.  ? ? ? ?SIGNIFICANT DIAGNOSTIC EXAMS ? ?PREVIOUS  ? ?11-10-21: left hip x-ray: Left subcapital femoral neck fracture  ? ?11-10-21: ct of head:  ?1. No CT evidence for acute intracranial abnormality. ?2. Atrophy and chronic  small vessel ischemic changes of the white matter. ? ?NO NEW EXAMS.  ? ?LABS REVIEWED ? ?11-10-21: wbc 13.8; hgb 12.0; hct 37.3 mcv 87.4 plt 337; glucose 105; bun 23; creat 1.04 ;k+ 3.8; na++ 134; ca 8.8; GFR 52; protein 7.3; albumin 4.0 ?11-13-21: wbc 19.2; hgb 7.5; hct 24.1 mcv 88 plt 220; glucose 133; bun 22; creat 1.03; k+ 3.7; na++ 135; ca 7.7; GFR 52; mag 1.8; iron 8; tibc 170; ferritin 107; folate 9.6; vit B12: 1326 ?11-14-21: urine culture: no growth ?11-17-21: wbc 12.9; hgb 10.2; hct 30.9; mcv 86.1 plt 351; glucose 112; bun 20; creat 0.79 ;k+ 3.9; na++  135; ca 7.6; GFR>60  ? ?TODAY ? ?11-24-21: wbc 9.9; hgb 10.4; hct 32.9; mcv 88.2 plt 479; glucose 71; bun 11; creat 0.88; k+ 3.5; na++ 139; ca 8.0; GFR>60.  ? ?Review of Systems  ?Constitutional:  Negative for malaise/fatigue.  ?Respiratory:  Negative for cough and shortness of breath.   ?Cardiovascular:  Negative for chest pain, palpitations and leg swelling.  ?Gastrointestinal:  Negative for abdominal pain, constipation and heartburn.  ?Musculoskeletal:  Positive for joint pain. Negative for back pain and myalgias.  ?Skin: Negative.   ?Neurological:  Negative for dizziness.  ?Psychiatric/Behavioral:  The patient is not nervous/anxious.   ? ? ?Physical Exam ?Constitutional:   ?   General: She is not in acute distress. ?   Appearance: She is well-developed. She is not diaphoretic.  ?Neck:  ?   Thyroid: No thyromegaly.  ?Cardiovascular:  ?   Rate and Rhythm: Normal rate and regular rhythm.  ?   Pulses: Normal pulses.  ?   Heart sounds: Normal heart sounds.  ?Pulmonary:  ?   Effort: Pulmonary effort is normal. No respiratory distress.  ?   Breath sounds: Normal breath sounds.  ?Abdominal:  ?   General: Bowel sounds are normal. There is no distension.  ?   Palpations: Abdomen is soft.  ?   Tenderness: There is no abdominal tenderness.  ?Musculoskeletal:  ?   Cervical back: Neck supple.  ?   Right lower leg: No edema.  ?   Left lower leg: No edema.  ?   Comments:  Is  able to move all extremities  ?11-12-21: left hemiarthroplasty   ?Lymphadenopathy:  ?   Cervical: No cervical adenopathy.  ?Skin: ?   General: Skin is warm and dry.  ?Neurological:  ?   Mental Status: She is alert

## 2021-12-02 ENCOUNTER — Non-Acute Institutional Stay (SKILLED_NURSING_FACILITY): Payer: No Typology Code available for payment source | Admitting: Adult Health

## 2021-12-02 ENCOUNTER — Other Ambulatory Visit: Payer: Self-pay | Admitting: Adult Health

## 2021-12-02 ENCOUNTER — Encounter: Payer: Self-pay | Admitting: Adult Health

## 2021-12-02 DIAGNOSIS — N1831 Chronic kidney disease, stage 3a: Secondary | ICD-10-CM | POA: Diagnosis not present

## 2021-12-02 DIAGNOSIS — N3281 Overactive bladder: Secondary | ICD-10-CM

## 2021-12-02 DIAGNOSIS — S72002A Fracture of unspecified part of neck of left femur, initial encounter for closed fracture: Secondary | ICD-10-CM | POA: Diagnosis not present

## 2021-12-02 DIAGNOSIS — N3021 Other chronic cystitis with hematuria: Secondary | ICD-10-CM

## 2021-12-02 DIAGNOSIS — J449 Chronic obstructive pulmonary disease, unspecified: Secondary | ICD-10-CM

## 2021-12-02 DIAGNOSIS — I4891 Unspecified atrial fibrillation: Secondary | ICD-10-CM

## 2021-12-02 MED ORDER — TRAZODONE HCL 50 MG PO TABS: 50.0000 mg | ORAL_TABLET | Freq: Every day | ORAL | 0 refills | Status: AC

## 2021-12-02 MED ORDER — FERROUS SULFATE 325 (65 FE) MG PO TABS: 325.0000 mg | ORAL_TABLET | Freq: Every day | ORAL | 0 refills | Status: AC

## 2021-12-02 MED ORDER — ESCITALOPRAM OXALATE 5 MG PO TABS: 5.0000 mg | ORAL_TABLET | Freq: Every day | ORAL | 0 refills | Status: AC

## 2021-12-02 MED ORDER — RIVAROXABAN 15 MG PO TABS: 15.0000 mg | ORAL_TABLET | Freq: Every morning | ORAL | 0 refills | Status: AC

## 2021-12-02 MED ORDER — ALBUTEROL SULFATE HFA 108 (90 BASE) MCG/ACT IN AERS
1.0000 | INHALATION_SPRAY | Freq: Four times a day (QID) | RESPIRATORY_TRACT | 0 refills | Status: AC | PRN
Start: 2021-12-02 — End: ?

## 2021-12-02 MED ORDER — GEMTESA 75 MG PO TABS: 1.0000 | ORAL_TABLET | Freq: Every day | ORAL | 0 refills | Status: AC

## 2021-12-02 MED ORDER — ONDANSETRON HCL 4 MG PO TABS: 4.0000 mg | ORAL_TABLET | Freq: Four times a day (QID) | ORAL | 0 refills | Status: AC | PRN

## 2021-12-02 MED ORDER — FLUTICASONE PROPIONATE 50 MCG/ACT NA SUSP: 1.0000 | Freq: Every day | NASAL | 0 refills | Status: AC

## 2021-12-02 MED ORDER — DILTIAZEM HCL ER COATED BEADS 180 MG PO CP24: 180.0000 mg | ORAL_CAPSULE | Freq: Every day | ORAL | 0 refills | Status: DC

## 2021-12-02 MED ORDER — NITROFURANTOIN MACROCRYSTAL 50 MG PO CAPS: ORAL_CAPSULE | ORAL | 0 refills | Status: DC

## 2021-12-02 MED ORDER — RESTASIS 0.05 % OP EMUL: 1.0000 [drp] | Freq: Two times a day (BID) | OPHTHALMIC | 0 refills | Status: AC

## 2021-12-02 NOTE — Progress Notes (Signed)
? ?Location:  [place] ?Nursing Home Room Number: 159-P ?Place of Service:  SNF (31) ? ?Provider: Ok Edwards np  ? ?PCP: Celene Squibb, MD ?Patient Care Team: ?Celene Squibb, MD as PCP - General (Internal Medicine) ?Arnoldo Lenis, MD as PCP - Cardiology (Cardiology) ? ?Extended Emergency Contact Information ?Primary Emergency Contact: CHEEK,KATHY ?Mobile Phone: [phone] ?Relation: Daughter ?Secondary Emergency Contact: [place] ?Home Phone: [phone] ?Mobile Phone: [phone] ?Relation: Son ? ?Code Status: dnr  ?Goals of care:  Advanced Directive information ? ?  12/02/2021  ? 11:00 AM  ?Advanced Directives  ?Does Patient Have a Medical Advance Directive? Yes  ?Type of Advance Directive Out of facility DNR (pink MOST or yellow form)  ?Does patient want to make changes to medical advance directive? No - Patient declined  ?Pre-existing out of facility DNR order (yellow form or pink MOST form) Yellow form placed in chart (order not valid for inpatient use)  ? ? ? ?Allergies  ?Allergen Reactions  ? Levaquin [Levofloxacin In D5w] Itching  ?  Lower extremities  ? Levofloxacin   ? Penicillins Nausea Only  ?  Has patient had a PCN reaction causing immediate rash, facial/tongue/throat swelling, SOB or lightheadedness with hypotension: No ?Has patient had a PCN reaction causing severe rash involving mucus membranes or skin necrosis: No ?Has patient had a PCN reaction that required hospitalization: No ?Has patient had a PCN reaction occurring within the last 10 years: No ?If all of the above answers are "NO", then may proceed with Cephalosporin use. ?  ? Sulfa Antibiotics Hives  ? Tequin [Gatifloxacin] Itching  ?  Lower extremities  ? Latex Itching and Rash  ? ? ?Chief Complaint  ?Patient presents with  ? Discharge Note  ? ? ?HPI:  ?88 y.o. female  being discharged to home with home health for pt/ot. Will not need dme; will need prescriptions written and will need to follow up with her medical  provider. She had been hospitalized for a hip fracture and is status post hemiarthroplasty. She was admitted to this facility for short term rehab. She has participated in pt/ot to improve upon her level of independence with her adls.  ? ? ? ?Past Medical History:  ?Diagnosis Date  ? Atrial flutter ([place])   ? Chronic UTI   ? COPD (chronic obstructive pulmonary disease) ([place])   ? Dizziness   ? GERD (gastroesophageal reflux disease)   ? IBS (irritable bowel syndrome)   ? Renal disorder   ? renal insufficiency  ? ? ?Past Surgical History:  ?Procedure Laterality Date  ? ABDOMINAL HYSTERECTOMY    ? 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 HIP ARTHROPLASTY Left 11/12/2021  ? Procedure: ARTHROPLASTY BIPOLAR HIP (HEMIARTHROPLASTY);  Surgeon: Mordecai Rasmussen, MD;  Location: [facility];  Service: Orthopedics;  Laterality: Left;  ? JOINT REPLACEMENT    ? ? ?  reports that she has never smoked. She has never used smokeless tobacco. She reports that she does not drink alcohol and does not use drugs. ?Social History  ? ?Socioeconomic History  ? Marital status: Widowed  ?  Spouse name: Not on file  ? Number of children: 3  ? Years of education: Not on file  ? Highest education level: Bachelor's degree (e.g., BA, AB, BS)  ?Occupational History  ? Occupation: Retired  ?Tobacco Use  ? Smoking status: Never  ? Smokeless tobacco: Never  ?Vaping Use  ? Vaping Use: Never used  ?Substance and Sexual Activity  ?  Alcohol use: Never  ?  Comment: occ  ? Drug use: Never  ? Sexual activity: Not on file  ?Other Topics Concern  ? Not on file  ?Social History Narrative  ? Patient moved to [place] from [place] in August 2019, to be near family  ? 02/03/19 lives alone , 1 mile from daughter, Claiborne Billings  ? ?Social Determinants of Health  ? ?Financial Resource Strain: Not on file  ?Food Insecurity: Not on file  ?Transportation Needs: Not on file  ?Physical Activity: Not on file  ?Stress: Not on file  ?Social Connections: Not on file  ?Intimate Partner Violence: Not on  file  ? ?Functional Status Survey: ?  ? ?Allergies  ?Allergen Reactions  ? Levaquin [Levofloxacin In D5w] Itching  ?  Lower extremities  ? Levofloxacin   ? Penicillins Nausea Only  ?  Has patient had a PCN reaction causing immediate rash, facial/tongue/throat swelling, SOB or lightheadedness with hypotension: No ?Has patient had a PCN reaction causing severe rash involving mucus membranes or skin necrosis: No ?Has patient had a PCN reaction that required hospitalization: No ?Has patient had a PCN reaction occurring within the last 10 years: No ?If all of the above answers are "NO", then may proceed with Cephalosporin use. ?  ? Sulfa Antibiotics Hives  ? Tequin [Gatifloxacin] Itching  ?  Lower extremities  ? Latex Itching and Rash  ? ? ?Pertinent  Health Maintenance Due  ?Topic Date Due  ? URINE MICROALBUMIN  Never done  ? DEXA SCAN  Never done  ? INFLUENZA VACCINE  04/04/2022  ? ? ?Medications: ?Outpatient Encounter Medications as of 12/02/2021  ?Medication Sig  ? acetaminophen (TYLENOL) 650 MG CR tablet Take 650 mg by mouth every 6 (six) hours.  ? Balsam Engineer, materials (VENELEX) OINT Topical ?Special Instructions: Apply to bilateral buttocks, coccyx and sacrum qshift for ?prevention. ?Every Shift  ? cyanocobalamin (,VITAMIN B-12,) 1000 MCG/ML injection Inject into the muscle.  ? docusate sodium (COLACE) 100 MG capsule Take 100 mg by mouth daily.  ? loperamide (IMODIUM) 2 MG capsule Take 2 mg by mouth as needed for diarrhea or loose stools.  ? Multiple Vitamins-Minerals (PRESERVISION AREDS PO) Take 1 tablet by mouth in the morning and at bedtime.  ? NON FORMULARY Diet:Regular  ? Nutritional Supplements (ENSURE ENLIVE PO) Take 237 mLs by mouth daily.  ? omeprazole (PRILOSEC) 20 MG capsule Take 20 mg by mouth daily.  ? polyethylene glycol (MIRALAX / GLYCOLAX) 17 g packet Take 17 g by mouth daily.  ? [DISCONTINUED] albuterol (PROVENTIL HFA;VENTOLIN HFA) 108 (90 Base) MCG/ACT inhaler Inhale 1-2 puffs into the lungs  every 6 (six) hours as needed for wheezing or shortness of breath.  ? [DISCONTINUED] diltiazem (CARDIZEM CD) 180 MG 24 hr capsule Take 1 capsule (180 mg total) by mouth daily.  ? [DISCONTINUED] escitalopram (LEXAPRO) 5 MG tablet Take 5 mg by mouth daily.  ? [DISCONTINUED] ferrous sulfate 325 (65 FE) MG tablet Take 325 mg by mouth daily.  ? [DISCONTINUED] fluticasone (FLONASE) 50 MCG/ACT nasal spray Place 1 spray into both nostrils daily.  ? [DISCONTINUED] nitrofurantoin (MACRODANTIN) 50 MG capsule TAKE ONE CAPSULE BY MOUTH EVERYDAY AT BEDTIME  ? [DISCONTINUED] ondansetron (ZOFRAN) 4 MG tablet Take 4 mg by mouth every 6 (six) hours as needed. for nausea  ? [DISCONTINUED] RESTASIS 0.05 % ophthalmic emulsion Place 1 drop into both eyes 2 (two) times daily.  ? [DISCONTINUED] Rivaroxaban (XARELTO) 15 MG TABS tablet Take 1 tablet (15 mg  total) by mouth every morning.  ? [DISCONTINUED] traZODone (DESYREL) 50 MG tablet Take 50 mg by mouth at bedtime.  ? [DISCONTINUED] Vibegron (GEMTESA) 75 MG TABS Take 1 capsule by mouth daily.  ? [DISCONTINUED] diazepam (VALIUM) 5 MG tablet Take 1 tablet (5 mg total) by mouth every 6 (six) hours as needed for anxiety.  ? ?No facility-administered encounter medications on file as of 12/02/2021.  ? ? ?Vitals:  ? 12/02/21 1050  ?BP: (!) 141/79  ?Pulse: 76  ?Resp: 20  ?Temp: 98 ?F (36.7 ?C)  ?SpO2: 97%  ?Weight: 128 lb (58.1 kg)  ?Height: 5\' 4"  (1.626 m)  ? ?Body mass index is 21.97 kg/m?. ? ? ?PREVIOUS  ? ?11-10-21: left hip x-ray: Left subcapital femoral neck fracture  ? ?11-10-21: ct of head:  ?1. No CT evidence for acute intracranial abnormality. ?2. Atrophy and chronic small vessel ischemic changes of the white matter. ? ?NO NEW EXAMS.  ? ?LABS REVIEWED ? ?11-10-21: wbc 13.8; hgb 12.0; hct 37.3 mcv 87.4 plt 337; glucose 105; bun 23; creat 1.04 ;k+ 3.8; na++ 134; ca 8.8; GFR 52; protein 7.3; albumin 4.0 ?11-13-21: wbc 19.2; hgb 7.5; hct 24.1 mcv 88 plt 220; glucose 133; bun 22; creat 1.03; k+ 3.7;  na++ 135; ca 7.7; GFR 52; mag 1.8; iron 8; tibc 170; ferritin 107; folate 9.6; vit B12: 1326 ?11-14-21: urine culture: no growth ?11-17-21: wbc 12.9; hgb 10.2; hct 30.9; mcv 86.1 plt 351; glucose 112; bu

## 2021-12-07 DIAGNOSIS — M79671 Pain in right foot: Secondary | ICD-10-CM | POA: Diagnosis not present

## 2021-12-07 DIAGNOSIS — Z758 Other problems related to medical facilities and other health care: Secondary | ICD-10-CM | POA: Diagnosis not present

## 2021-12-07 DIAGNOSIS — Z8679 Personal history of other diseases of the circulatory system: Secondary | ICD-10-CM | POA: Diagnosis not present

## 2021-12-07 DIAGNOSIS — Z96642 Presence of left artificial hip joint: Secondary | ICD-10-CM | POA: Diagnosis not present

## 2021-12-07 DIAGNOSIS — M79672 Pain in left foot: Secondary | ICD-10-CM | POA: Diagnosis not present

## 2021-12-07 DIAGNOSIS — R2242 Localized swelling, mass and lump, left lower limb: Secondary | ICD-10-CM | POA: Diagnosis not present

## 2021-12-08 DIAGNOSIS — D631 Anemia in chronic kidney disease: Secondary | ICD-10-CM | POA: Diagnosis not present

## 2021-12-08 DIAGNOSIS — K219 Gastro-esophageal reflux disease without esophagitis: Secondary | ICD-10-CM | POA: Diagnosis not present

## 2021-12-08 DIAGNOSIS — F419 Anxiety disorder, unspecified: Secondary | ICD-10-CM | POA: Diagnosis not present

## 2021-12-08 DIAGNOSIS — Z96642 Presence of left artificial hip joint: Secondary | ICD-10-CM | POA: Diagnosis not present

## 2021-12-08 DIAGNOSIS — I129 Hypertensive chronic kidney disease with stage 1 through stage 4 chronic kidney disease, or unspecified chronic kidney disease: Secondary | ICD-10-CM | POA: Diagnosis not present

## 2021-12-08 DIAGNOSIS — S72012D Unspecified intracapsular fracture of left femur, subsequent encounter for closed fracture with routine healing: Secondary | ICD-10-CM | POA: Diagnosis not present

## 2021-12-08 DIAGNOSIS — I480 Paroxysmal atrial fibrillation: Secondary | ICD-10-CM | POA: Diagnosis not present

## 2021-12-08 DIAGNOSIS — K589 Irritable bowel syndrome without diarrhea: Secondary | ICD-10-CM | POA: Diagnosis not present

## 2021-12-08 DIAGNOSIS — I4892 Unspecified atrial flutter: Secondary | ICD-10-CM | POA: Diagnosis not present

## 2021-12-08 DIAGNOSIS — W010XXD Fall on same level from slipping, tripping and stumbling without subsequent striking against object, subsequent encounter: Secondary | ICD-10-CM | POA: Diagnosis not present

## 2021-12-08 DIAGNOSIS — I2510 Atherosclerotic heart disease of native coronary artery without angina pectoris: Secondary | ICD-10-CM | POA: Diagnosis not present

## 2021-12-08 DIAGNOSIS — Z9181 History of falling: Secondary | ICD-10-CM | POA: Diagnosis not present

## 2021-12-08 DIAGNOSIS — J449 Chronic obstructive pulmonary disease, unspecified: Secondary | ICD-10-CM | POA: Diagnosis not present

## 2021-12-08 DIAGNOSIS — Z79899 Other long term (current) drug therapy: Secondary | ICD-10-CM | POA: Diagnosis not present

## 2021-12-08 DIAGNOSIS — N1831 Chronic kidney disease, stage 3a: Secondary | ICD-10-CM | POA: Diagnosis not present

## 2021-12-08 DIAGNOSIS — Z87440 Personal history of urinary (tract) infections: Secondary | ICD-10-CM | POA: Diagnosis not present

## 2021-12-08 DIAGNOSIS — F32A Depression, unspecified: Secondary | ICD-10-CM | POA: Diagnosis not present

## 2021-12-08 DIAGNOSIS — I447 Left bundle-branch block, unspecified: Secondary | ICD-10-CM | POA: Diagnosis not present

## 2021-12-08 DIAGNOSIS — Z7901 Long term (current) use of anticoagulants: Secondary | ICD-10-CM | POA: Diagnosis not present

## 2021-12-12 ENCOUNTER — Encounter: Payer: Self-pay | Admitting: Physician Assistant

## 2021-12-12 NOTE — Progress Notes (Signed)
? ?Cardiology Office Note   ? ?Date:  12/13/2021  ? ?ID:  Danicka Hourihan, DOB [birth year], MRN [id number] ? ?PCP:  Celene Squibb, MD  ?Cardiologist:  Carlyle Dolly, MD  ?Electrophysiologist:  None  ? ?Chief Complaint: f/u afib ? ?History of Present Illness:  ? ?Diana Mata is a 88 y.o. female with history of paroxysmal atrial fib/flutter, mild carotid artery disease (1-39% BICA 2020), chronic LBBB, HTN, COPD, CKD stage 2-3a, IBS, GERD, moderate TR, mild AI and severe pulmonary HTN by echo 11/2021 who presents for post-hospital follow-up. She is know to have a remote history of atrial flutter and chest pain prior to moving to [place], though our team first met her in 2019 for isolated elevated troponin in setting of UTI, hypotension and dehydration. She had recurrent atrial fib in 02/2021 and spontaneously converted. She was more recently admitted 11/2021 with fall after trying to stand up with resultant hip fracture and underwent left hemiarthroplasty. Cardiology followed her for paroxysms of afib in the post-op period, maintaining NSR/sinus tach by time of discharge. She was able to have her Xarelto resumed and went home on diltiazem 114m daily. 2D echo reviewed below with EF 60-65%, mild distal HK, severely elevated PASP, moderate TR, mild AI. ? ?She went to SNF for a period of time but returned home a little over a week ago. [place] They report the fall was an isolated event; she does not typically fall. She recalls while at SNF she had a few episodes of tachypalpitations during the PM hours while trying to sleep. They lasted only minutes but startled her. Since she's been back home she's done really well without any recurrent palpitations. No CP, SOB, edema. ? ? ?Labwork independently reviewed: ?11/2021 Hgb 10.4, Plt 749, K 3.5, Cr 0.88, Mg 1.9 ?2021 TSH Wnl ? ? ?Cardiology Studies:  ? ?Studies reviewed are outlined and summarized above. Reports included  below if pertinent.  ? ?2d echo 11/13/21 ? ? 1. Poor acoustic windows and frequent ectopy make accurate evaluation of  ?LVEF difficult. OVerall LVEF appears normal with mild distal inferior  ?hypokinesis. . Left ventricular ejection fraction, by estimation, is 60 to  ?65%. The left ventricle has normal  ?function.  ? 2. Right ventricular systolic function is low normal. The right  ?ventricular size is normal. There is severely elevated pulmonary artery  ?systolic pressure.  ? 3. The mitral valve is normal in structure. Trivial mitral valve  ?regurgitation.  ? 4. Tricuspid valve regurgitation is moderate.  ? 5. The aortic valve is tricuspid. Aortic valve regurgitation is mild.  ? 6. The inferior vena cava is normal in size with greater than 50%  ?respiratory variability, suggesting right atrial pressure of 3 mmHg.  ? ?Carotid 2020 ? ?   ?Summary:  ?Right Carotid: Velocities in the right ICA are consistent with a 1-39%  ?stenosis.  ? ?Left Carotid: Velocities in the left ICA are consistent with a 1-39%  ?stenosis.  ? ?Vertebrals:  Bilateral vertebral arteries demonstrate antegrade flow.  ?Subclavians: Normal flow hemodynamics were seen in bilateral subclavian  ?             arteries.  ? ?*See table(s) above for measurements and observations.   ? ? ?Past Medical History:  ?Diagnosis Date  ? Aortic insufficiency   ? Atrial flutter ([place]   ? Chronic kidney disease, stage 2 (mild)   ? stage 2-3  ? Chronic UTI   ? COPD (  chronic obstructive pulmonary disease) ([place])   ? Dizziness   ? Essential hypertension   ? GERD (gastroesophageal reflux disease)   ? IBS (irritable bowel syndrome)   ? LBBB (left bundle branch block)   ? Mild carotid artery disease ([place])   ? PAF (paroxysmal atrial fibrillation) ([place])   ? Pulmonary hypertension ([place])   ? Renal disorder   ? renal insufficiency  ? Tricuspid regurgitation   ? ? ?Past Surgical History:  ?Procedure Laterality Date  ? ABDOMINAL HYSTERECTOMY    ? 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 HIP  ARTHROPLASTY Left 11/12/2021  ? Procedure: ARTHROPLASTY BIPOLAR HIP (HEMIARTHROPLASTY);  Surgeon: Mordecai Rasmussen, MD;  Location: [facility];  Service: Orthopedics;  Laterality: Left;  ? JOINT REPLACEMENT    ? ? ?Current Medications: ?Current Meds  ?Medication Sig  ? acetaminophen (TYLENOL) 650 MG CR tablet Take 650 mg by mouth every 6 (six) hours.  ? albuterol (VENTOLIN HFA) 108 (90 Base) MCG/ACT inhaler Inhale 1-2 puffs into the lungs every 6 (six) hours as needed for wheezing or shortness of breath.  ? Balsam Engineer, materials (VENELEX) OINT Topical ?Special Instructions: Apply to bilateral buttocks, coccyx and sacrum qshift for ?prevention. ?Every Shift  ? cyanocobalamin (,VITAMIN B-12,) 1000 MCG/ML injection Inject into the muscle.  ? diltiazem (CARDIZEM CD) 180 MG 24 hr capsule Take 1 capsule (180 mg total) by mouth daily.  ? docusate sodium (COLACE) 100 MG capsule Take 100 mg by mouth daily.  ? escitalopram (LEXAPRO) 5 MG tablet Take 1 tablet (5 mg total) by mouth daily.  ? ferrous sulfate 325 (65 FE) MG tablet Take 1 tablet (325 mg total) by mouth daily.  ? fluticasone (FLONASE) 50 MCG/ACT nasal spray Place 1 spray into both nostrils daily.  ? loperamide (IMODIUM) 2 MG capsule Take 2 mg by mouth as needed for diarrhea or loose stools.  ? Multiple Vitamins-Minerals (PRESERVISION AREDS PO) Take 1 tablet by mouth in the morning and at bedtime.  ? nitrofurantoin (MACRODANTIN) 50 MG capsule TAKE ONE CAPSULE BY MOUTH EVERYDAY AT BEDTIME  ? NON FORMULARY Diet:Regular  ? Nutritional Supplements (ENSURE ENLIVE PO) Take 237 mLs by mouth daily.  ? omeprazole (PRILOSEC) 20 MG capsule Take 20 mg by mouth daily.  ? ondansetron (ZOFRAN) 4 MG tablet Take 1 tablet (4 mg total) by mouth every 6 (six) hours as needed. for nausea  ? polyethylene glycol (MIRALAX / GLYCOLAX) 17 g packet Take 17 g by mouth daily.  ? RESTASIS 0.05 % ophthalmic emulsion Place 1 drop into both eyes 2 (two) times daily.  ? Rivaroxaban (XARELTO) 15 MG TABS  tablet Take 1 tablet (15 mg total) by mouth every morning.  ? traZODone (DESYREL) 50 MG tablet Take 1 tablet (50 mg total) by mouth at bedtime.  ? Vibegron (GEMTESA) 75 MG TABS Take 1 capsule by mouth daily.  ?  ? ?Allergies:   Levaquin [levofloxacin in d5w], Levofloxacin, Penicillins, Sulfa antibiotics, Tequin [gatifloxacin], and Latex  ? ?Social History  ? ?Socioeconomic History  ? Marital status: Widowed  ?  Spouse name: Not on file  ? Number of children: 3  ? Years of education: Not on file  ? Highest education level: Bachelor's degree (e.g., BA, AB, BS)  ?Occupational History  ? Occupation: Retired  ?Tobacco Use  ? Smoking status: Never  ? Smokeless tobacco: Never  ?Vaping Use  ? Vaping Use: Never used  ?Substance and Sexual Activity  ? Alcohol use: Never  ?  Comment:  occ  ? Drug use: Never  ? Sexual activity: Not on file  ?Other Topics Concern  ? Not on file  ?Social History Narrative  ? Patient moved to [place] from [place] in August 2019, to be near family  ? 02/03/19 lives alone , 1 mile from daughter, Claiborne Billings  ? ?Social Determinants of Health  ? ?Financial Resource Strain: Not on file  ?Food Insecurity: Not on file  ?Transportation Needs: Not on file  ?Physical Activity: Not on file  ?Stress: Not on file  ?Social Connections: Not on file  ?  ? ?Family History:  ?The patient's family history includes Hypertension in her mother. ? ?ROS:   ?Please see the history of present illness. ?All other systems are reviewed and otherwise negative.  ? ? ?EKG(s)/Additional Labs  ? ?EKG:  EKG is ordered today, personally reviewed, demonstrating NSR 97bpm with occasional PAC, NSIVD with LBBB morphology and nonspecific STT changes similar to prior. ? ?Recent Labs: ?11/11/2021: ALT 43 ?11/16/2021: Magnesium 1.9 ?11/24/2021: BUN 11; Creatinine, Ser 0.88; Hemoglobin 10.4; Platelets 479; Potassium 3.5; Sodium 139  ?Recent Lipid Panel ?No results found for: CHOL, TRIG, HDL, CHOLHDL, VLDL, LDLCALC, LDLDIRECT ? ?PHYSICAL EXAM:    ? ?VS:  BP 102/62   Pulse 97   Wt 120 lb (54.4 kg)   SpO2 96%   BMI 20.60 kg/m?   BMI: Body mass index is 20.6 kg/m?. ? ?GEN: Frail appearing elderly female in no acute distress ?HEENT: normocephalic, atraumat

## 2021-12-13 ENCOUNTER — Encounter: Payer: Self-pay | Admitting: Physician Assistant

## 2021-12-13 ENCOUNTER — Ambulatory Visit (INDEPENDENT_AMBULATORY_CARE_PROVIDER_SITE_OTHER): Payer: No Typology Code available for payment source | Admitting: Physician Assistant

## 2021-12-13 VITALS — BP 110/64 | HR 97 | Wt 120.0 lb

## 2021-12-13 DIAGNOSIS — K589 Irritable bowel syndrome without diarrhea: Secondary | ICD-10-CM | POA: Diagnosis not present

## 2021-12-13 DIAGNOSIS — Z9181 History of falling: Secondary | ICD-10-CM | POA: Diagnosis not present

## 2021-12-13 DIAGNOSIS — I10 Essential (primary) hypertension: Secondary | ICD-10-CM

## 2021-12-13 DIAGNOSIS — N1831 Chronic kidney disease, stage 3a: Secondary | ICD-10-CM | POA: Diagnosis not present

## 2021-12-13 DIAGNOSIS — F419 Anxiety disorder, unspecified: Secondary | ICD-10-CM | POA: Diagnosis not present

## 2021-12-13 DIAGNOSIS — Z87440 Personal history of urinary (tract) infections: Secondary | ICD-10-CM | POA: Diagnosis not present

## 2021-12-13 DIAGNOSIS — K219 Gastro-esophageal reflux disease without esophagitis: Secondary | ICD-10-CM | POA: Diagnosis not present

## 2021-12-13 DIAGNOSIS — S72012D Unspecified intracapsular fracture of left femur, subsequent encounter for closed fracture with routine healing: Secondary | ICD-10-CM | POA: Diagnosis not present

## 2021-12-13 DIAGNOSIS — Z79899 Other long term (current) drug therapy: Secondary | ICD-10-CM | POA: Diagnosis not present

## 2021-12-13 DIAGNOSIS — D649 Anemia, unspecified: Secondary | ICD-10-CM | POA: Diagnosis not present

## 2021-12-13 DIAGNOSIS — I480 Paroxysmal atrial fibrillation: Secondary | ICD-10-CM | POA: Diagnosis not present

## 2021-12-13 DIAGNOSIS — I272 Pulmonary hypertension, unspecified: Secondary | ICD-10-CM | POA: Diagnosis not present

## 2021-12-13 DIAGNOSIS — I4892 Unspecified atrial flutter: Secondary | ICD-10-CM | POA: Diagnosis not present

## 2021-12-13 DIAGNOSIS — I447 Left bundle-branch block, unspecified: Secondary | ICD-10-CM | POA: Diagnosis not present

## 2021-12-13 DIAGNOSIS — J449 Chronic obstructive pulmonary disease, unspecified: Secondary | ICD-10-CM | POA: Diagnosis not present

## 2021-12-13 DIAGNOSIS — Z7901 Long term (current) use of anticoagulants: Secondary | ICD-10-CM | POA: Diagnosis not present

## 2021-12-13 DIAGNOSIS — I2510 Atherosclerotic heart disease of native coronary artery without angina pectoris: Secondary | ICD-10-CM | POA: Diagnosis not present

## 2021-12-13 DIAGNOSIS — Z96642 Presence of left artificial hip joint: Secondary | ICD-10-CM | POA: Diagnosis not present

## 2021-12-13 DIAGNOSIS — F32A Depression, unspecified: Secondary | ICD-10-CM | POA: Diagnosis not present

## 2021-12-13 DIAGNOSIS — D631 Anemia in chronic kidney disease: Secondary | ICD-10-CM | POA: Diagnosis not present

## 2021-12-13 DIAGNOSIS — I129 Hypertensive chronic kidney disease with stage 1 through stage 4 chronic kidney disease, or unspecified chronic kidney disease: Secondary | ICD-10-CM | POA: Diagnosis not present

## 2021-12-13 DIAGNOSIS — W010XXD Fall on same level from slipping, tripping and stumbling without subsequent striking against object, subsequent encounter: Secondary | ICD-10-CM | POA: Diagnosis not present

## 2021-12-13 DIAGNOSIS — I779 Disorder of arteries and arterioles, unspecified: Secondary | ICD-10-CM | POA: Diagnosis not present

## 2021-12-13 MED ORDER — DILTIAZEM HCL 30 MG PO TABS: 30.0000 mg | ORAL_TABLET | Freq: Every day | ORAL | 3 refills | Status: AC | PRN

## 2021-12-13 NOTE — Patient Instructions (Signed)
Medication Instructions:  ?Start Diltiazem 30 mg tablets as needed for palpitations ? ?Labwork: ?CBC ?BMET ?TSH ? ?Follow-Up: ?Follow up with Ronie Spies, PA-C in 4 months.  ? ?Any Other Special Instructions Will Be Listed Below (If Applicable). ? ? ? ? ?If you need a refill on your cardiac medications before your next appointment, please call your pharmacy. ? ?

## 2021-12-16 ENCOUNTER — Other Ambulatory Visit (HOSPITAL_COMMUNITY)
Admission: RE | Admit: 2021-12-16 | Discharge: 2021-12-16 | Disposition: A | Discharge status: Home / Self Care | Payer: No Typology Code available for payment source | Source: Ambulatory Visit | Attending: Physician Assistant | Admitting: Physician Assistant

## 2021-12-16 DIAGNOSIS — I480 Paroxysmal atrial fibrillation: Secondary | ICD-10-CM | POA: Diagnosis not present

## 2021-12-16 LAB — BASIC METABOLIC PANEL
Anion gap: 8 (ref 5–15)
BUN: 14 mg/dL (ref 8–23)
CO2: 20 mmol/L — ABNORMAL LOW (ref 22–32)
Calcium: 8.8 mg/dL — ABNORMAL LOW (ref 8.9–10.3)
Chloride: 109 mmol/L (ref 98–111)
Creatinine, Ser: 0.85 mg/dL (ref 0.44–1.00)
GFR, Estimated: >60 mL/min (ref >60)
Glucose, Bld: 90 mg/dL (ref 70–99)
Potassium: 4 mmol/L (ref 3.5–5.1)
Sodium: 137 mmol/L (ref 135–145)

## 2021-12-16 LAB — CBC
HCT: 34.5 % — ABNORMAL LOW (ref 36.0–46.0)
Hemoglobin: 10.7 g/dL — ABNORMAL LOW (ref 12.0–15.0)
MCH: 28.7 pg (ref 26.0–34.0)
MCHC: 31 g/dL (ref 30.0–36.0)
MCV: 92.5 fL (ref 80.0–100.0)
Platelets: 322 10*3/uL (ref 150–400)
RBC: 3.73 MIL/uL — ABNORMAL LOW (ref 3.87–5.11)
RDW: 17.6 % — ABNORMAL HIGH (ref 11.5–15.5)
WBC: 7.6 10*3/uL (ref 4.0–10.5)
nRBC: 0 % (ref 0.0–0.2)

## 2021-12-16 LAB — TSH: TSH: 2.638 u[IU]/mL (ref 0.350–4.500)

## 2021-12-19 DIAGNOSIS — I129 Hypertensive chronic kidney disease with stage 1 through stage 4 chronic kidney disease, or unspecified chronic kidney disease: Secondary | ICD-10-CM | POA: Diagnosis not present

## 2021-12-19 DIAGNOSIS — D631 Anemia in chronic kidney disease: Secondary | ICD-10-CM | POA: Diagnosis not present

## 2021-12-19 DIAGNOSIS — Z9181 History of falling: Secondary | ICD-10-CM | POA: Diagnosis not present

## 2021-12-19 DIAGNOSIS — I4892 Unspecified atrial flutter: Secondary | ICD-10-CM | POA: Diagnosis not present

## 2021-12-19 DIAGNOSIS — Z79899 Other long term (current) drug therapy: Secondary | ICD-10-CM | POA: Diagnosis not present

## 2021-12-19 DIAGNOSIS — Z7901 Long term (current) use of anticoagulants: Secondary | ICD-10-CM | POA: Diagnosis not present

## 2021-12-19 DIAGNOSIS — W010XXD Fall on same level from slipping, tripping and stumbling without subsequent striking against object, subsequent encounter: Secondary | ICD-10-CM | POA: Diagnosis not present

## 2021-12-19 DIAGNOSIS — F419 Anxiety disorder, unspecified: Secondary | ICD-10-CM | POA: Diagnosis not present

## 2021-12-19 DIAGNOSIS — F32A Depression, unspecified: Secondary | ICD-10-CM | POA: Diagnosis not present

## 2021-12-19 DIAGNOSIS — N1831 Chronic kidney disease, stage 3a: Secondary | ICD-10-CM | POA: Diagnosis not present

## 2021-12-19 DIAGNOSIS — I2510 Atherosclerotic heart disease of native coronary artery without angina pectoris: Secondary | ICD-10-CM | POA: Diagnosis not present

## 2021-12-19 DIAGNOSIS — K219 Gastro-esophageal reflux disease without esophagitis: Secondary | ICD-10-CM | POA: Diagnosis not present

## 2021-12-19 DIAGNOSIS — K589 Irritable bowel syndrome without diarrhea: Secondary | ICD-10-CM | POA: Diagnosis not present

## 2021-12-19 DIAGNOSIS — I480 Paroxysmal atrial fibrillation: Secondary | ICD-10-CM | POA: Diagnosis not present

## 2021-12-19 DIAGNOSIS — Z96642 Presence of left artificial hip joint: Secondary | ICD-10-CM | POA: Diagnosis not present

## 2021-12-19 DIAGNOSIS — Z87440 Personal history of urinary (tract) infections: Secondary | ICD-10-CM | POA: Diagnosis not present

## 2021-12-19 DIAGNOSIS — J449 Chronic obstructive pulmonary disease, unspecified: Secondary | ICD-10-CM | POA: Diagnosis not present

## 2021-12-19 DIAGNOSIS — I447 Left bundle-branch block, unspecified: Secondary | ICD-10-CM | POA: Diagnosis not present

## 2021-12-19 DIAGNOSIS — S72012D Unspecified intracapsular fracture of left femur, subsequent encounter for closed fracture with routine healing: Secondary | ICD-10-CM | POA: Diagnosis not present

## 2021-12-20 DIAGNOSIS — L298 Other pruritus: Secondary | ICD-10-CM | POA: Diagnosis not present

## 2021-12-20 DIAGNOSIS — R208 Other disturbances of skin sensation: Secondary | ICD-10-CM | POA: Diagnosis not present

## 2021-12-20 DIAGNOSIS — Z789 Other specified health status: Secondary | ICD-10-CM | POA: Diagnosis not present

## 2021-12-20 DIAGNOSIS — L538 Other specified erythematous conditions: Secondary | ICD-10-CM | POA: Diagnosis not present

## 2021-12-20 DIAGNOSIS — L820 Inflamed seborrheic keratosis: Secondary | ICD-10-CM | POA: Diagnosis not present

## 2021-12-21 DIAGNOSIS — Z79899 Other long term (current) drug therapy: Secondary | ICD-10-CM | POA: Diagnosis not present

## 2021-12-21 DIAGNOSIS — N1831 Chronic kidney disease, stage 3a: Secondary | ICD-10-CM | POA: Diagnosis not present

## 2021-12-21 DIAGNOSIS — K589 Irritable bowel syndrome without diarrhea: Secondary | ICD-10-CM | POA: Diagnosis not present

## 2021-12-21 DIAGNOSIS — S72012D Unspecified intracapsular fracture of left femur, subsequent encounter for closed fracture with routine healing: Secondary | ICD-10-CM | POA: Diagnosis not present

## 2021-12-21 DIAGNOSIS — Z9181 History of falling: Secondary | ICD-10-CM | POA: Diagnosis not present

## 2021-12-21 DIAGNOSIS — I4892 Unspecified atrial flutter: Secondary | ICD-10-CM | POA: Diagnosis not present

## 2021-12-21 DIAGNOSIS — Z87440 Personal history of urinary (tract) infections: Secondary | ICD-10-CM | POA: Diagnosis not present

## 2021-12-21 DIAGNOSIS — E538 Deficiency of other specified B group vitamins: Secondary | ICD-10-CM | POA: Diagnosis not present

## 2021-12-21 DIAGNOSIS — J449 Chronic obstructive pulmonary disease, unspecified: Secondary | ICD-10-CM | POA: Diagnosis not present

## 2021-12-21 DIAGNOSIS — F32A Depression, unspecified: Secondary | ICD-10-CM | POA: Diagnosis not present

## 2021-12-21 DIAGNOSIS — I2510 Atherosclerotic heart disease of native coronary artery without angina pectoris: Secondary | ICD-10-CM | POA: Diagnosis not present

## 2021-12-21 DIAGNOSIS — K219 Gastro-esophageal reflux disease without esophagitis: Secondary | ICD-10-CM | POA: Diagnosis not present

## 2021-12-21 DIAGNOSIS — W010XXD Fall on same level from slipping, tripping and stumbling without subsequent striking against object, subsequent encounter: Secondary | ICD-10-CM | POA: Diagnosis not present

## 2021-12-21 DIAGNOSIS — D631 Anemia in chronic kidney disease: Secondary | ICD-10-CM | POA: Diagnosis not present

## 2021-12-21 DIAGNOSIS — Z96642 Presence of left artificial hip joint: Secondary | ICD-10-CM | POA: Diagnosis not present

## 2021-12-21 DIAGNOSIS — F419 Anxiety disorder, unspecified: Secondary | ICD-10-CM | POA: Diagnosis not present

## 2021-12-21 DIAGNOSIS — Z7901 Long term (current) use of anticoagulants: Secondary | ICD-10-CM | POA: Diagnosis not present

## 2021-12-21 DIAGNOSIS — I129 Hypertensive chronic kidney disease with stage 1 through stage 4 chronic kidney disease, or unspecified chronic kidney disease: Secondary | ICD-10-CM | POA: Diagnosis not present

## 2021-12-21 DIAGNOSIS — I447 Left bundle-branch block, unspecified: Secondary | ICD-10-CM | POA: Diagnosis not present

## 2021-12-21 DIAGNOSIS — I480 Paroxysmal atrial fibrillation: Secondary | ICD-10-CM | POA: Diagnosis not present

## 2021-12-22 DIAGNOSIS — F419 Anxiety disorder, unspecified: Secondary | ICD-10-CM | POA: Diagnosis not present

## 2021-12-22 DIAGNOSIS — J449 Chronic obstructive pulmonary disease, unspecified: Secondary | ICD-10-CM | POA: Diagnosis not present

## 2021-12-22 DIAGNOSIS — Z9181 History of falling: Secondary | ICD-10-CM | POA: Diagnosis not present

## 2021-12-22 DIAGNOSIS — Z87440 Personal history of urinary (tract) infections: Secondary | ICD-10-CM | POA: Diagnosis not present

## 2021-12-22 DIAGNOSIS — Z96642 Presence of left artificial hip joint: Secondary | ICD-10-CM | POA: Diagnosis not present

## 2021-12-22 DIAGNOSIS — K589 Irritable bowel syndrome without diarrhea: Secondary | ICD-10-CM | POA: Diagnosis not present

## 2021-12-22 DIAGNOSIS — Z7901 Long term (current) use of anticoagulants: Secondary | ICD-10-CM | POA: Diagnosis not present

## 2021-12-22 DIAGNOSIS — Z79899 Other long term (current) drug therapy: Secondary | ICD-10-CM | POA: Diagnosis not present

## 2021-12-22 DIAGNOSIS — I4892 Unspecified atrial flutter: Secondary | ICD-10-CM | POA: Diagnosis not present

## 2021-12-22 DIAGNOSIS — I2510 Atherosclerotic heart disease of native coronary artery without angina pectoris: Secondary | ICD-10-CM | POA: Diagnosis not present

## 2021-12-22 DIAGNOSIS — I447 Left bundle-branch block, unspecified: Secondary | ICD-10-CM | POA: Diagnosis not present

## 2021-12-22 DIAGNOSIS — F32A Depression, unspecified: Secondary | ICD-10-CM | POA: Diagnosis not present

## 2021-12-22 DIAGNOSIS — I480 Paroxysmal atrial fibrillation: Secondary | ICD-10-CM | POA: Diagnosis not present

## 2021-12-22 DIAGNOSIS — K219 Gastro-esophageal reflux disease without esophagitis: Secondary | ICD-10-CM | POA: Diagnosis not present

## 2021-12-22 DIAGNOSIS — I129 Hypertensive chronic kidney disease with stage 1 through stage 4 chronic kidney disease, or unspecified chronic kidney disease: Secondary | ICD-10-CM | POA: Diagnosis not present

## 2021-12-22 DIAGNOSIS — W010XXD Fall on same level from slipping, tripping and stumbling without subsequent striking against object, subsequent encounter: Secondary | ICD-10-CM | POA: Diagnosis not present

## 2021-12-22 DIAGNOSIS — S72012D Unspecified intracapsular fracture of left femur, subsequent encounter for closed fracture with routine healing: Secondary | ICD-10-CM | POA: Diagnosis not present

## 2021-12-22 DIAGNOSIS — D631 Anemia in chronic kidney disease: Secondary | ICD-10-CM | POA: Diagnosis not present

## 2021-12-22 DIAGNOSIS — N1831 Chronic kidney disease, stage 3a: Secondary | ICD-10-CM | POA: Diagnosis not present

## 2021-12-27 ENCOUNTER — Ambulatory Visit (INDEPENDENT_AMBULATORY_CARE_PROVIDER_SITE_OTHER): Payer: No Typology Code available for payment source | Admitting: Orthopedic Surgery

## 2021-12-27 ENCOUNTER — Ambulatory Visit (INDEPENDENT_AMBULATORY_CARE_PROVIDER_SITE_OTHER): Payer: No Typology Code available for payment source

## 2021-12-27 ENCOUNTER — Other Ambulatory Visit: Payer: Self-pay | Admitting: Urology

## 2021-12-27 ENCOUNTER — Encounter: Payer: Self-pay | Admitting: Orthopedic Surgery

## 2021-12-27 VITALS — Ht 63.0 in | Wt 120.0 lb

## 2021-12-27 DIAGNOSIS — Z7901 Long term (current) use of anticoagulants: Secondary | ICD-10-CM | POA: Diagnosis not present

## 2021-12-27 DIAGNOSIS — Z96642 Presence of left artificial hip joint: Secondary | ICD-10-CM | POA: Diagnosis not present

## 2021-12-27 DIAGNOSIS — Z9181 History of falling: Secondary | ICD-10-CM | POA: Diagnosis not present

## 2021-12-27 DIAGNOSIS — Z87440 Personal history of urinary (tract) infections: Secondary | ICD-10-CM | POA: Diagnosis not present

## 2021-12-27 DIAGNOSIS — S72012D Unspecified intracapsular fracture of left femur, subsequent encounter for closed fracture with routine healing: Secondary | ICD-10-CM | POA: Diagnosis not present

## 2021-12-27 DIAGNOSIS — N3021 Other chronic cystitis with hematuria: Secondary | ICD-10-CM

## 2021-12-27 DIAGNOSIS — S72002D Fracture of unspecified part of neck of left femur, subsequent encounter for closed fracture with routine healing: Secondary | ICD-10-CM

## 2021-12-27 DIAGNOSIS — I447 Left bundle-branch block, unspecified: Secondary | ICD-10-CM | POA: Diagnosis not present

## 2021-12-27 DIAGNOSIS — J449 Chronic obstructive pulmonary disease, unspecified: Secondary | ICD-10-CM | POA: Diagnosis not present

## 2021-12-27 DIAGNOSIS — F419 Anxiety disorder, unspecified: Secondary | ICD-10-CM | POA: Diagnosis not present

## 2021-12-27 DIAGNOSIS — N1831 Chronic kidney disease, stage 3a: Secondary | ICD-10-CM | POA: Diagnosis not present

## 2021-12-27 DIAGNOSIS — Z79899 Other long term (current) drug therapy: Secondary | ICD-10-CM | POA: Diagnosis not present

## 2021-12-27 DIAGNOSIS — N3281 Overactive bladder: Secondary | ICD-10-CM

## 2021-12-27 DIAGNOSIS — K219 Gastro-esophageal reflux disease without esophagitis: Secondary | ICD-10-CM | POA: Diagnosis not present

## 2021-12-27 DIAGNOSIS — K589 Irritable bowel syndrome without diarrhea: Secondary | ICD-10-CM | POA: Diagnosis not present

## 2021-12-27 DIAGNOSIS — I480 Paroxysmal atrial fibrillation: Secondary | ICD-10-CM | POA: Diagnosis not present

## 2021-12-27 DIAGNOSIS — I4892 Unspecified atrial flutter: Secondary | ICD-10-CM | POA: Diagnosis not present

## 2021-12-27 DIAGNOSIS — I129 Hypertensive chronic kidney disease with stage 1 through stage 4 chronic kidney disease, or unspecified chronic kidney disease: Secondary | ICD-10-CM | POA: Diagnosis not present

## 2021-12-27 DIAGNOSIS — F32A Depression, unspecified: Secondary | ICD-10-CM | POA: Diagnosis not present

## 2021-12-27 DIAGNOSIS — D631 Anemia in chronic kidney disease: Secondary | ICD-10-CM | POA: Diagnosis not present

## 2021-12-27 DIAGNOSIS — W010XXD Fall on same level from slipping, tripping and stumbling without subsequent striking against object, subsequent encounter: Secondary | ICD-10-CM | POA: Diagnosis not present

## 2021-12-27 DIAGNOSIS — I2510 Atherosclerotic heart disease of native coronary artery without angina pectoris: Secondary | ICD-10-CM | POA: Diagnosis not present

## 2021-12-28 ENCOUNTER — Encounter: Payer: Self-pay | Admitting: Orthopedic Surgery

## 2021-12-28 DIAGNOSIS — S72012D Unspecified intracapsular fracture of left femur, subsequent encounter for closed fracture with routine healing: Secondary | ICD-10-CM | POA: Diagnosis not present

## 2021-12-28 DIAGNOSIS — K589 Irritable bowel syndrome without diarrhea: Secondary | ICD-10-CM | POA: Diagnosis not present

## 2021-12-28 DIAGNOSIS — I447 Left bundle-branch block, unspecified: Secondary | ICD-10-CM | POA: Diagnosis not present

## 2021-12-28 DIAGNOSIS — Z87440 Personal history of urinary (tract) infections: Secondary | ICD-10-CM | POA: Diagnosis not present

## 2021-12-28 DIAGNOSIS — F419 Anxiety disorder, unspecified: Secondary | ICD-10-CM | POA: Diagnosis not present

## 2021-12-28 DIAGNOSIS — D631 Anemia in chronic kidney disease: Secondary | ICD-10-CM | POA: Diagnosis not present

## 2021-12-28 DIAGNOSIS — Z96642 Presence of left artificial hip joint: Secondary | ICD-10-CM | POA: Diagnosis not present

## 2021-12-28 DIAGNOSIS — I480 Paroxysmal atrial fibrillation: Secondary | ICD-10-CM | POA: Diagnosis not present

## 2021-12-28 DIAGNOSIS — Z7901 Long term (current) use of anticoagulants: Secondary | ICD-10-CM | POA: Diagnosis not present

## 2021-12-28 DIAGNOSIS — Z79899 Other long term (current) drug therapy: Secondary | ICD-10-CM | POA: Diagnosis not present

## 2021-12-28 DIAGNOSIS — I2510 Atherosclerotic heart disease of native coronary artery without angina pectoris: Secondary | ICD-10-CM | POA: Diagnosis not present

## 2021-12-28 DIAGNOSIS — I129 Hypertensive chronic kidney disease with stage 1 through stage 4 chronic kidney disease, or unspecified chronic kidney disease: Secondary | ICD-10-CM | POA: Diagnosis not present

## 2021-12-28 DIAGNOSIS — Z9181 History of falling: Secondary | ICD-10-CM | POA: Diagnosis not present

## 2021-12-28 DIAGNOSIS — I4892 Unspecified atrial flutter: Secondary | ICD-10-CM | POA: Diagnosis not present

## 2021-12-28 DIAGNOSIS — K219 Gastro-esophageal reflux disease without esophagitis: Secondary | ICD-10-CM | POA: Diagnosis not present

## 2021-12-28 DIAGNOSIS — F32A Depression, unspecified: Secondary | ICD-10-CM | POA: Diagnosis not present

## 2021-12-28 DIAGNOSIS — J449 Chronic obstructive pulmonary disease, unspecified: Secondary | ICD-10-CM | POA: Diagnosis not present

## 2021-12-28 DIAGNOSIS — N1831 Chronic kidney disease, stage 3a: Secondary | ICD-10-CM | POA: Diagnosis not present

## 2021-12-28 DIAGNOSIS — W010XXD Fall on same level from slipping, tripping and stumbling without subsequent striking against object, subsequent encounter: Secondary | ICD-10-CM | POA: Diagnosis not present

## 2021-12-28 NOTE — Progress Notes (Signed)
Orthopaedic Postop Note ? ?Assessment: ?Diana Mata is a 88 y.o. female s/p Left hip hemiarthroplasty, with placement of cerclage cable ? ?DOS: 11/12/2021 ? ?Plan: ?Healing and recovering well. ?Continue to walk with the assistance of a walker. ?Work on physical therapy. ?Okay to transition to a cane if comfortable. ?Follow posterior precautions for an additional 6 weeks. ?Medications as needed. ? ?Follow up in 6 weeks ? ? ?Follow-up: ?Return in about 6 weeks (around 02/07/2022). ?XR at next visit: AP pelvis and Left hip ? ?Subjective: ? ?Chief Complaint  ?Patient presents with  ? Routine Post Op  ?  Lt hip DOS 11/12/21  ? ? ?History of Present Illness: ?Diana Mata is a 88 y.o. female who presents following the above stated procedure.  She denies pain in her groin.  She does have some pain over the lateral hip.  No issues with her surgical incisions.  She continues to walk with the assistance of a walker.  She is uncomfortable on a cane.  Occasional Tylenol for pain. ? ? ?Review of Systems: ?No fevers or chills ?No numbness or tingling ?No Chest Pain ?No shortness of breath ? ? ?Objective: ?Ht 5\' 3"  (1.6 m)   Wt 120 lb (54.4 kg)   BMI 21.26 kg/m?  ? ?Physical Exam: ? ?Alert and oriented.  Elderly female. ? ?Surgical incision is healing well.  No surrounding erythema or drainage.  Mild tenderness to palpation over the lateral hip.  She tolerates gentle range of motion without pain.  She is able to maintain a straight leg raise.  Active motion intact in the EHL/TA.  Pain in the lateral hip with axial loading. ? ?IMAGING: ?I personally ordered and reviewed the following images: ? ?AP pelvis and x-rays of the left hip demonstrates a well-positioned left hip hemiarthroplasty.  There is no evidence of hardware failure or loosening.  No subsidence of the implant.  Cerclage wire remains in good position.  Prosthesis is not loose. ? ?Impression: Stable appearing left hip hemiarthroplasty. ? ? ,  MD ?12/28/2021 ?1:27 PM ? ? ?

## 2022-01-02 ENCOUNTER — Encounter: Payer: No Typology Code available for payment source | Admitting: Orthopedic Surgery

## 2022-02-07 ENCOUNTER — Encounter: Payer: Self-pay | Admitting: Orthopedic Surgery

## 2022-02-07 ENCOUNTER — Other Ambulatory Visit: Payer: Self-pay | Admitting: Adult Health

## 2022-02-07 ENCOUNTER — Ambulatory Visit (INDEPENDENT_AMBULATORY_CARE_PROVIDER_SITE_OTHER): Payer: No Typology Code available for payment source

## 2022-02-07 ENCOUNTER — Ambulatory Visit (INDEPENDENT_AMBULATORY_CARE_PROVIDER_SITE_OTHER): Payer: No Typology Code available for payment source | Admitting: Orthopedic Surgery

## 2022-02-07 DIAGNOSIS — S72002D Fracture of unspecified part of neck of left femur, subsequent encounter for closed fracture with routine healing: Secondary | ICD-10-CM

## 2022-02-07 DIAGNOSIS — N3021 Other chronic cystitis with hematuria: Secondary | ICD-10-CM

## 2022-02-07 DIAGNOSIS — N3281 Overactive bladder: Secondary | ICD-10-CM

## 2022-02-07 NOTE — Progress Notes (Signed)
Orthopaedic Postop Note  Assessment: Diana Mata is a 88 y.o. female s/p Left hip hemiarthroplasty, with placement of cerclage cable  DOS: 11/12/2021  Plan: He is doing well.  Radiographs are stable She is ambulating with a cane Some tenderness laterally, as well as occasional pain in her groin  Anticipate this will improve. She will continue to improve from surgery. Continue to walk for therapy, and exercise.  This will serve to strengthen the leg, as well as the injury.  Follow-up in clinic as needed.  Follow-up: Return if symptoms worsen or fail to improve. XR at next visit: AP pelvis and Left hip  Subjective:  Chief Complaint  Patient presents with   Routine Post Op    LT hip s/p surgery DOS 11/12/21    History of Present Illness: Diana Mata is a 88 y.o. female who presents following the above stated procedure.  Surgery was 3 months ago.  She is doing well.  She is using a cane to assist with ambulation.  She has occasional pain in the groin.  She is taking occasional Tylenol.  She states that she has some tenderness over the lateral thigh.  She is very cautious, since the fall.   Review of Systems: No fevers or chills No numbness or tingling No Chest Pain No shortness of breath   Objective: There were no vitals taken for this visit.  Physical Exam:  Alert and oriented.  Elderly female.  Surgical incision is healed well.  No surrounding erythema or drainage.  She does have some tenderness along the incision.  She tolerates gentle range of motion of the left hip.  No pain with axial loading.  She can maintain a straight leg raise.  Toes are warm and well-perfused.  Active motion intact in the EHL/TA.  IMAGING: I personally ordered and reviewed the following images:  AP pelvis and x-rays of the left hip demonstrates a well-positioned left hip hemiarthroplasty.  There is no change in the overall alignment.  Cerclage cable remains in good position.   No evidence of subsidence.  No hardware failure or loosening.  No lucency around the prosthesis.  Impression: Well-positioned left hip hemiarthroplasty, without acute injury or subsidence  Oliver Barre, MD 02/07/2022 11:06 PM

## 2022-02-08 ENCOUNTER — Other Ambulatory Visit: Payer: Self-pay | Admitting: Urology

## 2022-02-08 DIAGNOSIS — N3281 Overactive bladder: Secondary | ICD-10-CM

## 2022-02-08 DIAGNOSIS — N3021 Other chronic cystitis with hematuria: Secondary | ICD-10-CM

## 2022-03-07 ENCOUNTER — Other Ambulatory Visit: Payer: Self-pay | Admitting: Urology

## 2022-03-07 DIAGNOSIS — N3021 Other chronic cystitis with hematuria: Secondary | ICD-10-CM

## 2022-03-07 DIAGNOSIS — N3281 Overactive bladder: Secondary | ICD-10-CM

## 2022-03-09 DIAGNOSIS — L820 Inflamed seborrheic keratosis: Secondary | ICD-10-CM | POA: Diagnosis not present

## 2022-03-09 DIAGNOSIS — R208 Other disturbances of skin sensation: Secondary | ICD-10-CM | POA: Diagnosis not present

## 2022-03-09 DIAGNOSIS — Z789 Other specified health status: Secondary | ICD-10-CM | POA: Diagnosis not present

## 2022-03-09 DIAGNOSIS — L298 Other pruritus: Secondary | ICD-10-CM | POA: Diagnosis not present

## 2022-03-09 DIAGNOSIS — L538 Other specified erythematous conditions: Secondary | ICD-10-CM | POA: Diagnosis not present

## 2022-03-29 DIAGNOSIS — B351 Tinea unguium: Secondary | ICD-10-CM | POA: Diagnosis not present

## 2022-04-10 ENCOUNTER — Other Ambulatory Visit: Payer: Self-pay | Admitting: Urology

## 2022-04-10 DIAGNOSIS — N3281 Overactive bladder: Secondary | ICD-10-CM

## 2022-04-10 DIAGNOSIS — N3021 Other chronic cystitis with hematuria: Secondary | ICD-10-CM

## 2022-04-12 ENCOUNTER — Ambulatory Visit (INDEPENDENT_AMBULATORY_CARE_PROVIDER_SITE_OTHER): Payer: No Typology Code available for payment source | Admitting: Podiatry

## 2022-04-12 DIAGNOSIS — L603 Nail dystrophy: Secondary | ICD-10-CM | POA: Diagnosis not present

## 2022-04-12 NOTE — Progress Notes (Signed)
Chief Complaint  Patient presents with   Nail Problem    Patient is here for bilateral toenail fungus great toe for 2-3 months.    HPI: 88 y.o. female presenting today with her mother for evaluation of nail dystrophy to the bilateral great toenails.  Patient states that she has had discoloration and thickening to the toenails for several years.  She is embarrassed and does not want to go get a pedicure.  She has not done anything recently for treatment other than topical Lamisil.  She presents for further treatment and evaluation  Past Medical History:  Diagnosis Date   Aortic insufficiency    Atrial flutter ([facility])    Chronic kidney disease, stage 2 (mild)    stage 2-3   Chronic UTI    COPD (chronic obstructive pulmonary disease) ([facility])    Dizziness    Essential hypertension    GERD (gastroesophageal reflux disease)    IBS (irritable bowel syndrome)    LBBB (left bundle branch block)    Mild carotid artery disease ([facility])    PAF (paroxysmal atrial fibrillation) (H[facility])    Pulmonary hypertension (H[facility])    Renal disorder    renal insufficiency   Tricuspid regurgitation     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     HIP ARTHROPLASTY Left 11/12/2021   Procedure: ARTHROPLASTY BIPOLAR HIP (HEMIARTHROPLASTY);  Surgeon: Oliver Barre, MD;  Location: [facility];  Service: Orthopedics;  Laterality: Left;   JOINT REPLACEMENT      Allergies  Allergen Reactions   Levaquin [Levofloxacin In D5w] Itching    Lower extremities   Levofloxacin    Penicillins Nausea Only    Has patient had a PCN reaction causing immediate rash, facial/tongue/throat swelling, SOB or lightheadedness with hypotension: No Has patient had a PCN reaction causing severe rash involving mucus membranes or skin necrosis: No Has patient had a PCN reaction that required hospitalization: No Has patient had a PCN reaction occurring within the last 10 years: No If  all of the above answers are "NO", then may proceed with Cephalosporin use.    Sulfa Antibiotics Hives   Tequin [Gatifloxacin] Itching    Lower extremities   Latex Itching and Rash     Physical Exam: General: The patient is alert and oriented x3 in no acute distress.  Dermatology: Hyperkeratotic dystrophic nails noted to the bilateral great toes  Vascular: Palpable pedal pulses bilaterally. Capillary refill within normal limits.  Negative for any significant edema or erythema  Neurological: Light touch and protective threshold grossly intact  Musculoskeletal Exam: No pedal deformities noted  Assessment: 1.  Onychomycosis and nail dystrophy bilateral great toes   Plan of Care:  1. Patient evaluated.  2.  Today we discussed different treatment options for the bilateral great toenails.  Discussed treatment modalities including oral, topical, and laser antifungal treatment modalities.  At this time recommend simple conservative treatment. 3.  Mechanical debridement of the nails was performed bilaterally using a nail nipper without incident or bleeding 4.  OTC Tolcylen antifungal topical dispensed at checkout.  Apply daily 5.  Return to clinic as needed  *Daughter is a Research officer, political party for a PCP practice in [place], DPM [facility]  Dr. Felecia Shelling, DPM    [address]  [place], [place] [address]                Office [phone]  Fax [phone]

## 2022-04-18 ENCOUNTER — Other Ambulatory Visit: Payer: Self-pay | Admitting: Cardiology

## 2022-04-26 DIAGNOSIS — J069 Acute upper respiratory infection, unspecified: Secondary | ICD-10-CM | POA: Diagnosis not present

## 2022-05-08 NOTE — Progress Notes (Signed)
Cardiology Office Note    Date:  05/09/2022   ID:  Diana Mata, DOB 01-29-1931, MRN [id number]  PCP:  Celene Squibb, MD  Cardiologist:  Carlyle Dolly, MD  Electrophysiologist:  None   Chief Complaint: f/u afib/flutter  History of Present Illness:   Diana Mata is a 88 y.o. female with history of paroxysmal atrial fib/flutter, mild carotid artery disease (1-39% BICA 2020), chronic LBBB, HTN, COPD, CKD stage 2-3a, IBS, GERD, moderate TR, mild AI and severe pulmonary HTN by echo 11/2021 who presents for follow-up.   She is know to have a remote history of atrial flutter and chest pain prior to moving to [place], though our team first met her in 2019 for isolated elevated troponin in setting of UTI, hypotension and dehydration. She had recurrent atrial fib in 02/2021 and spontaneously converted. She was readmitted 11/2021 with mechanical fall with resultant hip fracture and underwent left hemiarthroplasty. This was reported to be an isolated fall without significant history of such. Cardiology followed her for paroxysms of afib in the post-op period, maintaining NSR/sinus tach by time of discharge. She was able to have her Xarelto resumed and went home on diltiazem 126m daily. 2D echo reviewed below with EF 60-65%, mild distal HK, severely elevated PASP, moderate TR, mild AI.  She is seen for follow-up today and emphatically says she feels she is doing great. No recent breakthrough palpitations. She's not had to use the PRN diltiazem we prescribed last visit. No CP, SOB. Appetite is not great so her daughter set up Alexa to remind her to drink her protein shake every day in the afternoon - it goes to her hearing aid. She uses a cane or walker to ambulate (depending on the scenario) as she is terrified of falling again. She has not fallen since last admission. Her daughter Diana Mata at Dr. HJuel Burrowoffice.   Labwork independently reviewed: 12/2021 Hgb 10.7, plt 322, K 4.0, Cr 0.85, TSH  wnl 11/2021 Hgb 10.4, Plt 749, Mg 1.9, albumin 3.2, AST 66, ALT wnl 10/2019 (PCP following) trig 189, LDL 99   Cardiology Studies:   Studies reviewed are outlined and summarized above. Reports included below if pertinent.   2d echo 11/13/21   1. Poor acoustic windows and frequent ectopy make accurate evaluation of  LVEF difficult. OVerall LVEF appears normal with mild distal inferior  hypokinesis. . Left ventricular ejection fraction, by estimation, is 60 to  65%. The left ventricle has normal  function.   2. Right ventricular systolic function is low normal. The right  ventricular size is normal. There is severely elevated pulmonary artery  systolic pressure.   3. The mitral valve is normal in structure. Trivial mitral valve  regurgitation.   4. Tricuspid valve regurgitation is moderate.   5. The aortic valve is tricuspid. Aortic valve regurgitation is mild.   6. The inferior vena cava is normal in size with greater than 50%  respiratory variability, suggesting right atrial pressure of 3 mmHg.    Carotid 2020     Summary:  Right Carotid: Velocities in the right ICA are consistent with a 1-39%  stenosis.   Left Carotid: Velocities in the left ICA are consistent with a 1-39%  stenosis.   Vertebrals:  Bilateral vertebral arteries demonstrate antegrade flow.  Subclavians: Normal flow hemodynamics were seen in bilateral subclavian               arteries.   *See table(s) above for measurements and observations.  Past Medical History:  Diagnosis Date   Aortic insufficiency    Atrial flutter (HCC)    Chronic kidney disease, stage 2 (mild)    stage 2-3   Chronic UTI    COPD (chronic obstructive pulmonary disease) (HCC)    Dizziness    Essential hypertension    GERD (gastroesophageal reflux disease)    IBS (irritable bowel syndrome)    LBBB (left bundle branch block)    Mild carotid artery disease (HCC)    PAF (paroxysmal atrial fibrillation) (HCC)    Pulmonary  hypertension (HCC)    Renal disorder    renal insufficiency   Tricuspid regurgitation     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     HIP ARTHROPLASTY Left 11/12/2021   Procedure: ARTHROPLASTY BIPOLAR HIP (HEMIARTHROPLASTY);  Surgeon: Mordecai Rasmussen, MD;  Location: [facility];  Service: Orthopedics;  Laterality: Left;   JOINT REPLACEMENT      Current Medications: Current Meds  Medication Sig   acetaminophen (TYLENOL) 650 MG CR tablet Take 650 mg by mouth every 6 (six) hours.   albuterol (VENTOLIN HFA) 108 (90 Base) MCG/ACT inhaler Inhale 1-2 puffs into the lungs every 6 (six) hours as needed for wheezing or shortness of breath.   cyanocobalamin (,VITAMIN B-12,) 1000 MCG/ML injection Inject into the muscle.   diltiazem (CARDIZEM CD) 180 MG 24 hr capsule TAKE ONE CAPSULE BY MOUTH ONCE DAILY   escitalopram (LEXAPRO) 5 MG tablet Take 1 tablet (5 mg total) by mouth daily.   ferrous sulfate 325 (65 FE) MG tablet Take 1 tablet (325 mg total) by mouth daily.   fluticasone (FLONASE) 50 MCG/ACT nasal spray Place 1 spray into both nostrils daily.   loperamide (IMODIUM) 2 MG capsule Take 2 mg by mouth as needed for diarrhea or loose stools.   Multiple Vitamins-Minerals (PRESERVISION AREDS PO) Take 1 tablet by mouth in the morning and at bedtime.   nitrofurantoin (MACRODANTIN) 50 MG capsule TAKE ONE CAPSULE BY MOUTH EVERYDAY AT BEDTIME   NON FORMULARY Diet:Regular   omeprazole (PRILOSEC) 20 MG capsule Take 20 mg by mouth daily.   ondansetron (ZOFRAN) 4 MG tablet Take 1 tablet (4 mg total) by mouth every 6 (six) hours as needed. for nausea   polyethylene glycol (MIRALAX / GLYCOLAX) 17 g packet Take 17 g by mouth daily.   RESTASIS 0.05 % ophthalmic emulsion Place 1 drop into both eyes 2 (two) times daily.   Rivaroxaban (XARELTO) 15 MG TABS tablet Take 1 tablet (15 mg total) by mouth every morning.   traZODone (DESYREL) 50 MG tablet Take 1  tablet (50 mg total) by mouth at bedtime.   Trospium Chloride 60 MG CP24 Take 1 capsule (60 mg total) by mouth at bedtime.   Vibegron (GEMTESA) 75 MG TABS Take 1 capsule by mouth daily.      Allergies:   Levaquin [levofloxacin in d5w], Levofloxacin, Penicillins, Sulfa antibiotics, Tequin [gatifloxacin], and Latex   Social History   Socioeconomic History   Marital status: Widowed    Spouse name: Not on file   Number of children: 3   Years of education: Not on file   Highest education level: Bachelor's degree (e.g., BA, AB, BS)  Occupational History   Occupation: Retired  Tobacco Use   Smoking status: Never   Smokeless tobacco: Never  Vaping Use   Vaping Use: Never used  Substance and Sexual Activity   Alcohol use:  Never    Comment: occ   Drug use: Never   Sexual activity: Not on file  Other Topics Concern   Not on file  Social History Narrative   Patient moved to [place] from [place] in August 2019, to be near family   02/03/19 lives alone , 1 mile from daughter, Claiborne Billings   Social Determinants of Health   Financial Resource Strain: Not on [employer] Insecurity: Not on file  Transportation Needs: Not on file  Physical Activity: Not on file  Stress: Not on file  Social Connections: Not on file     Family History:  The patient's family history includes Hypertension in her mother.  ROS:   Please see the history of present illness. All other systems are reviewed and otherwise negative.    EKG(s)/Additional Labs   EKG:  EKG is not ordered today  Recent Labs: 11/11/2021: ALT 43 11/16/2021: Magnesium 1.9 12/16/2021: BUN 14; Creatinine, Ser 0.85; Hemoglobin 10.7; Platelets 322; Potassium 4.0; Sodium 137; TSH 2.638  Recent Lipid Panel No results found for: "CHOL", "TRIG", "HDL", "CHOLHDL", "VLDL", "LDLCALC", "LDLDIRECT"  PHYSICAL EXAM:    VS:  BP (!) 116/56   Pulse 76   Ht 5' 4" (1.626 m)   Wt 115 lb (52.2 kg)   SpO2 98%   BMI 19.74 kg/m   BMI: Body mass  index is 19.74 kg/m.  GEN: Well nourished, well developed female in no acute distress HEENT: normocephalic, atraumatic Neck: no JVD, carotid bruits, or masses Cardiac: RRR; soft SEM LSB similarly described 12/2021, no rubs or gallops, no edema  Respiratory:  clear to auscultation bilaterally, normal work of breathing GI: soft, nontender, nondistended, + BS MS: no deformity or atrophy Skin: warm and dry, no rash Neuro:  Alert and Oriented x 3, Strength and sensation are intact, follows commands Psych: euthymic mood, full affect  Wt Readings from Last 3 Encounters:  05/09/22 115 lb (52.2 kg)  12/27/21 120 lb (54.4 kg)  12/13/21 120 lb (54.4 kg)     ASSESSMENT & PLAN:   1. Paroxysmal atrial fib, with underlying LBBB - maintaining NSR on exam today and by recent lack of palpitations at home. Continue long acting diltiazem. She also has short acting diltiazem as needed available for any breakthrough. Continue Xarelto for stroke prophylaxis, again reviewed risk/benefits. Most recent CrCl 12/2021 was appropriate for dosing. She also follows with PCP regularly as well. Will see if we can see if they have more recent labs than April. If not, would suggest a f/u BMET/CBC at the 6 month mark in 06/2022. Will otherwise f/u in 6 months.   2. Severe pulmonary HTN by echo, with moderate TR and mild AI - this was obtained in the setting of multiple physiologic stressors in 12/2021. She is completely asymptomatic without any dyspnea, hypoxia, worsening HF, or syncope. Though we could consider following this study up to assess for improvement, it would not necessarily change management at this juncture. Given her frailty and advanced age, conservative management is appropriate. Per shared decision making with patient/daughter, we will defer f/u echocardiogram and follow symptomatically at this point.  3. Essential HTN - controlled, no concerns today.  4. Mild carotid artery disease - noted on carotid duplex  2020, no overt bruits heard today. Given lack of any recent symptoms, advanced age, and frailty, defer further testing to PRN only. Lipids are managed in primary care.    Disposition: F/u with Dr. Harl Bowie in 6 months.   Medication  Adjustments/Labs and Tests Ordered: Current medicines are reviewed at length with the patient today.  Concerns regarding medicines are outlined above. Medication changes, Labs and Tests ordered today are summarized above and listed in the Patient Instructions accessible in Encounters.    Signed, Charlie Pitter, PA-C  05/09/2022 2:18 PM    [facility] Location in [place] [place], [place] [address] Ph: [phone]; Fax [phone]

## 2022-05-09 ENCOUNTER — Telehealth: Payer: Self-pay | Admitting: Physician Assistant

## 2022-05-09 ENCOUNTER — Ambulatory Visit: Payer: No Typology Code available for payment source | Attending: Physician Assistant | Admitting: Physician Assistant

## 2022-05-09 ENCOUNTER — Encounter: Payer: Self-pay | Admitting: Physician Assistant

## 2022-05-09 VITALS — BP 116/56 | HR 76 | Ht 64.0 in | Wt 115.0 lb

## 2022-05-09 DIAGNOSIS — I10 Essential (primary) hypertension: Secondary | ICD-10-CM

## 2022-05-09 DIAGNOSIS — I480 Paroxysmal atrial fibrillation: Secondary | ICD-10-CM

## 2022-05-09 DIAGNOSIS — I351 Nonrheumatic aortic (valve) insufficiency: Secondary | ICD-10-CM | POA: Diagnosis not present

## 2022-05-09 DIAGNOSIS — I272 Pulmonary hypertension, unspecified: Secondary | ICD-10-CM | POA: Diagnosis not present

## 2022-05-09 DIAGNOSIS — Z79899 Other long term (current) drug therapy: Secondary | ICD-10-CM

## 2022-05-09 DIAGNOSIS — I779 Disorder of arteries and arterioles, unspecified: Secondary | ICD-10-CM | POA: Diagnosis not present

## 2022-05-09 DIAGNOSIS — I447 Left bundle-branch block, unspecified: Secondary | ICD-10-CM

## 2022-05-09 DIAGNOSIS — I071 Rheumatic tricuspid insufficiency: Secondary | ICD-10-CM | POA: Diagnosis not present

## 2022-05-09 NOTE — Telephone Encounter (Signed)
Patient seen in clinic today. Please call PCP Dr. Scharlene Gloss office to see if they have more recent labs than April 12/2021. If not, would suggest a f/u BMET/CBC at the 6 month mark (06/2022). Can either be done here or with primary care, but looking to ensure labs are stable for Xarelto dosing. Otherwise she was clinically stable at today's visit and can f/u here in 6 months.

## 2022-05-09 NOTE — Telephone Encounter (Signed)
Most recent labs from Dr.Hall was 11/2021   Daughter will get cbc,bmet in October at [facility]

## 2022-05-09 NOTE — Patient Instructions (Signed)
Medication Instructions:  Your physician recommends that you continue on your current medications as directed. Please refer to the Current Medication list given to you today.   Labwork: None today  Testing/Procedures: None today  Follow-Up: 6 months  Any Other Special Instructions Will Be Listed Below (If Applicable).  If you need a refill on your cardiac medications before your next appointment, please call your pharmacy.  

## 2022-06-05 ENCOUNTER — Other Ambulatory Visit: Payer: Self-pay | Admitting: Urology

## 2022-06-05 DIAGNOSIS — N3021 Other chronic cystitis with hematuria: Secondary | ICD-10-CM

## 2022-06-05 DIAGNOSIS — N3281 Overactive bladder: Secondary | ICD-10-CM

## 2022-07-04 ENCOUNTER — Other Ambulatory Visit: Payer: Self-pay | Admitting: Urology

## 2022-07-04 DIAGNOSIS — N3021 Other chronic cystitis with hematuria: Secondary | ICD-10-CM

## 2022-07-04 DIAGNOSIS — N3281 Overactive bladder: Secondary | ICD-10-CM

## 2022-07-14 DIAGNOSIS — Z0000 Encounter for general adult medical examination without abnormal findings: Secondary | ICD-10-CM | POA: Diagnosis not present

## 2022-07-14 DIAGNOSIS — E538 Deficiency of other specified B group vitamins: Secondary | ICD-10-CM | POA: Diagnosis not present

## 2022-08-03 DIAGNOSIS — R079 Chest pain, unspecified: Secondary | ICD-10-CM | POA: Diagnosis not present

## 2022-08-04 DIAGNOSIS — I6529 Occlusion and stenosis of unspecified carotid artery: Secondary | ICD-10-CM | POA: Diagnosis not present

## 2022-08-07 ENCOUNTER — Telehealth: Payer: Self-pay | Admitting: Cardiology

## 2022-08-07 NOTE — Telephone Encounter (Signed)
  Olegario Messier calling is calling, she said pt had chest discomfort last week, pt saw her pcp and they did two EKG which is both normal but pt's troponin  was 20 on Thursday and 27 on Friday. They were advised by Dr. Margo Aye to call Dr. Wyline Mood for recommendations

## 2022-08-07 NOTE — Telephone Encounter (Signed)
Spoke with Daughter who states that her mother has been chest pain free since Friday. She is just following up because troponin's were elevated. She will fax EKG and lab results to our office. Please advise.

## 2022-08-08 NOTE — Telephone Encounter (Signed)
Spoke to pt's daughter who verbalized understanding. Pt will see provider in office on 12/12 @ 10 am.

## 2022-08-15 ENCOUNTER — Ambulatory Visit: Payer: No Typology Code available for payment source | Admitting: Cardiology

## 2022-08-15 NOTE — Progress Notes (Deleted)
Clinical Summary Diana Mata is a 88 y.o.female  PAF - notes mentino prior history of paroxsmal afib/aflutter - recent admission 02/2021 - from notes was controlled on dilt gtt, self converted - it was noted she was no longer taking her home diltiazem 30mg  bid.  - no recent symptoms.  - no bleeding on xarelto.  - home bp's    2. Chronic LBBB     3. COPD     4. CKD 3  5. Pulmonary HTN -11/2021 echo severe pulm HTN PASP 61, low normal RV function, mod TR - no further workup due to advanced age, poor functional status.    Past Medical History:  Diagnosis Date   Aortic insufficiency    Atrial flutter (HCC)    Chronic kidney disease, stage 2 (mild)    stage 2-3   Chronic UTI    COPD (chronic obstructive pulmonary disease) (HCC)    Dizziness    Essential hypertension    GERD (gastroesophageal reflux disease)    IBS (irritable bowel syndrome)    LBBB (left bundle Diana Mata)    Mild carotid artery disease (HCC)    PAF (paroxysmal atrial fibrillation) (HCC)    Pulmonary hypertension (HCC)    Renal disorder    renal insufficiency   Tricuspid regurgitation      Allergies  Allergen Reactions   Levaquin [Levofloxacin In D5w] Itching    Lower extremities   Levofloxacin    Penicillins Nausea Only    Has patient had a PCN reaction causing immediate rash, facial/tongue/throat swelling, SOB or lightheadedness with hypotension: No Has patient had a PCN reaction causing severe rash involving mucus membranes or skin necrosis: No Has patient had a PCN reaction that required hospitalization: No Has patient had a PCN reaction occurring within the last 10 years: No If all of the above answers are "NO", then may proceed with Cephalosporin use.    Sulfa Antibiotics Hives   Tequin [Gatifloxacin] Itching    Lower extremities   Latex Itching and Rash     Current Outpatient Medications  Medication Sig Dispense Refill   acetaminophen (TYLENOL) 650 MG CR tablet Take  650 mg by mouth every 6 (six) hours.     albuterol (VENTOLIN HFA) 108 (90 Base) MCG/ACT inhaler Inhale 1-2 puffs into the lungs every 6 (six) hours as needed for wheezing or shortness of breath. 6.7 g 0   Balsam [place]-Castor Oil (VENELEX) OINT Topical Special Instructions: Apply to bilateral buttocks, coccyx and sacrum qshift for prevention. Every Shift (Patient not taking: Reported on 05/09/2022)     cyanocobalamin (,VITAMIN B-12,) 1000 MCG/ML injection Inject into the muscle.     diltiazem (CARDIZEM CD) 180 MG 24 hr capsule TAKE ONE CAPSULE BY MOUTH ONCE DAILY 30 capsule 6   diltiazem (CARDIZEM) 30 MG tablet Take 1 tablet (30 mg total) by mouth daily as needed (As needed for palpitations). (Patient not taking: Reported on 05/09/2022) 90 tablet 3   docusate sodium (COLACE) 100 MG capsule Take 100 mg by mouth daily. (Patient not taking: Reported on 05/09/2022)     escitalopram (LEXAPRO) 5 MG tablet Take 1 tablet (5 mg total) by mouth daily. 30 tablet 0   ferrous sulfate 325 (65 FE) MG tablet Take 1 tablet (325 mg total) by mouth daily. 30 tablet 0   fluticasone (FLONASE) 50 MCG/ACT nasal spray Place 1 spray into both nostrils daily. 9.9 mL 0   loperamide (IMODIUM) 2 MG capsule Take 2 mg by mouth  as needed for diarrhea or loose stools.     Multiple Vitamins-Minerals (PRESERVISION AREDS PO) Take 1 tablet by mouth in the morning and at bedtime.     nitrofurantoin (MACRODANTIN) 50 MG capsule TAKE ONE CAPSULE BY MOUTH EVERYDAY AT BEDTIME 30 capsule 0   NON FORMULARY Diet:Regular     Nutritional Supplements (ENSURE ENLIVE PO) Take 237 mLs by mouth daily. (Patient not taking: Reported on 05/09/2022)     omeprazole (PRILOSEC) 20 MG capsule Take 20 mg by mouth daily.     ondansetron (ZOFRAN) 4 MG tablet Take 1 tablet (4 mg total) by mouth every 6 (six) hours as needed. for nausea 20 tablet 0   polyethylene glycol (MIRALAX / GLYCOLAX) 17 g packet Take 17 g by mouth daily. 14 each 0   RESTASIS 0.05 % ophthalmic  emulsion Place 1 drop into both eyes 2 (two) times daily. 5.5 mL 0   Rivaroxaban (XARELTO) 15 MG TABS tablet Take 1 tablet (15 mg total) by mouth every morning. 30 tablet 0   traZODone (DESYREL) 50 MG tablet Take 1 tablet (50 mg total) by mouth at bedtime. 30 tablet 0   Trospium Chloride 60 MG CP24 Take 1 capsule (60 mg total) by mouth at bedtime. 30 capsule 11   Vibegron (GEMTESA) 75 MG TABS Take 1 capsule by mouth daily. 30 tablet 0   No current facility-administered medications for this visit.     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     HIP ARTHROPLASTY Left 11/12/2021   Procedure: ARTHROPLASTY BIPOLAR HIP (HEMIARTHROPLASTY);  Surgeon: Mordecai Rasmussen, MD;  Location: [facility];  Service: Orthopedics;  Laterality: Left;   JOINT REPLACEMENT       Allergies  Allergen Reactions   Levaquin [Levofloxacin In D5w] Itching    Lower extremities   Levofloxacin    Penicillins Nausea Only    Has patient had a PCN reaction causing immediate rash, facial/tongue/throat swelling, SOB or lightheadedness with hypotension: No Has patient had a PCN reaction causing severe rash involving mucus membranes or skin necrosis: No Has patient had a PCN reaction that required hospitalization: No Has patient had a PCN reaction occurring within the last 10 years: No If all of the above answers are "NO", then may proceed with Cephalosporin use.    Sulfa Antibiotics Hives   Tequin [Gatifloxacin] Itching    Lower extremities   Latex Itching and Rash      Family History  Problem Relation Age of Onset   Hypertension Mother      Social History Diana Mata reports that she has never smoked. She has never used smokeless tobacco. Diana Mata reports no history of alcohol use.   Review of Systems CONSTITUTIONAL: No weight loss, fever, chills, weakness or fatigue.  HEENT: Eyes: No visual loss, blurred vision, double vision or yellow  sclerae.No hearing loss, sneezing, congestion, runny nose or sore throat.  SKIN: No rash or itching.  CARDIOVASCULAR:  RESPIRATORY: No shortness of breath, cough or sputum.  GASTROINTESTINAL: No anorexia, nausea, vomiting or diarrhea. No abdominal pain or blood.  GENITOURINARY: No burning on urination, no polyuria NEUROLOGICAL: No headache, dizziness, syncope, paralysis, ataxia, numbness or tingling in the extremities. No change in bowel or bladder control.  MUSCULOSKELETAL: No muscle, back pain, joint pain or stiffness.  LYMPHATICS: No enlarged nodes. No history of splenectomy.  PSYCHIATRIC: No history of depression or anxiety.  ENDOCRINOLOGIC: No reports of sweating,  cold or heat intolerance. No polyuria or polydipsia.  Marland Kitchen   Physical Examination There were no vitals filed for this visit. There were no vitals filed for this visit.  Gen: resting comfortably, no acute distress HEENT: no scleral icterus, pupils equal round and reactive, no palptable cervical adenopathy,  CV Resp: Clear to auscultation bilaterally GI: abdomen is soft, non-tender, non-distended, normal bowel sounds, no hepatosplenomegaly MSK: extremities are warm, no edema.  Skin: warm, no rash Neuro:  no focal deficits Psych: appropriate affect   Diagnostic Studies  11/2021 echo 1. Poor acoustic windows and frequent ectopy make accurate evaluation of  LVEF difficult. OVerall LVEF appears normal with mild distal inferior  hypokinesis. . Left ventricular ejection fraction, by estimation, is 60 to  65%. The left ventricle has normal  function.   2. Right ventricular systolic function is low normal. The right  ventricular size is normal. There is severely elevated pulmonary artery  systolic pressure.   3. The mitral valve is normal in structure. Trivial mitral valve  regurgitation.   4. Tricuspid valve regurgitation is moderate.   5. The aortic valve is tricuspid. Aortic valve regurgitation is mild.   6. The  inferior vena cava is normal in size with greater than 50%  respiratory variability, suggesting right atrial pressure of 3 mmHg.    Assessment and Plan  PAF - doing well since discharge, no recurrent symptoms - continue diltiazem and eliquis.  - appears had some low bp's on dilt before and that's when it was stopped, currentlty tolerating.       Antoine Poche, M.D., F.A.C.C.

## 2022-09-15 DIAGNOSIS — E538 Deficiency of other specified B group vitamins: Secondary | ICD-10-CM | POA: Diagnosis not present

## 2022-10-25 ENCOUNTER — Other Ambulatory Visit: Payer: Self-pay | Admitting: Cardiology

## 2022-11-02 ENCOUNTER — Encounter: Payer: Self-pay | Admitting: Radiology

## 2022-11-10 ENCOUNTER — Ambulatory Visit: Payer: No Typology Code available for payment source | Admitting: Cardiology

## 2022-12-08 DIAGNOSIS — E538 Deficiency of other specified B group vitamins: Secondary | ICD-10-CM | POA: Diagnosis not present

## 2022-12-24 ENCOUNTER — Other Ambulatory Visit: Payer: Self-pay | Admitting: Urology

## 2022-12-24 DIAGNOSIS — N3021 Other chronic cystitis with hematuria: Secondary | ICD-10-CM

## 2022-12-24 DIAGNOSIS — N3281 Overactive bladder: Secondary | ICD-10-CM

## 2023-01-05 DIAGNOSIS — E538 Deficiency of other specified B group vitamins: Secondary | ICD-10-CM | POA: Diagnosis not present

## 2023-01-15 DIAGNOSIS — R319 Hematuria, unspecified: Secondary | ICD-10-CM | POA: Diagnosis not present

## 2023-01-15 DIAGNOSIS — N390 Urinary tract infection, site not specified: Secondary | ICD-10-CM | POA: Diagnosis not present

## 2023-01-19 ENCOUNTER — Encounter: Payer: Self-pay | Admitting: Cardiology

## 2023-01-19 ENCOUNTER — Ambulatory Visit: Payer: No Typology Code available for payment source | Attending: Cardiology | Admitting: Cardiology

## 2023-01-19 VITALS — BP 110/60 | HR 74 | Ht 64.0 in | Wt 122.4 lb

## 2023-01-19 DIAGNOSIS — I10 Essential (primary) hypertension: Secondary | ICD-10-CM

## 2023-01-19 DIAGNOSIS — I480 Paroxysmal atrial fibrillation: Secondary | ICD-10-CM

## 2023-01-19 NOTE — Patient Instructions (Signed)
Medication Instructions:  Your physician recommends that you continue on your current medications as directed. Please refer to the Current Medication list given to you today.  *If you need a refill on your cardiac medications before your next appointment, please call your pharmacy*   Lab Work: None If you have labs (blood work) drawn today and your tests are completely normal, you will receive your results only by: MyChart Message (if you have MyChart) OR A paper copy in the mail If you have any lab test that is abnormal or we need to change your treatment, we will call you to review the results.   Testing/Procedures: None   Follow-Up: At [facility], you and your health needs are our priority.  As part of our continuing mission to provide you with exceptional heart care, we have created designated Provider Care Teams.  These Care Teams include your primary Cardiologist (physician) and Advanced Practice Providers (APPs -  Physician Assistants and Nurse Practitioners) who all work together to provide you with the care you need, when you need it.  We recommend signing up for the patient portal called "MyChart".  Sign up information is provided on this After Visit Summary.  MyChart is used to connect with patients for Virtual Visits (Telemedicine).  Patients are able to view lab/test results, encounter notes, upcoming appointments, etc.  Non-urgent messages can be sent to your provider as well.   To learn more about what you can do with MyChart, go to [email or web address].    Your next appointment:   6 month(s)  Provider:   Jonathan Branch, MD    Other Instructions    

## 2023-01-19 NOTE — Progress Notes (Signed)
Clinical Summary Diana Mata is a 89 y.o.female seen today for follow up of the following medical problems.     PAF - notes mentino prior history of paroxsmal afib/aflutter - recent admission 02/2021 - from notes was controlled on dilt gtt, self converted - it was noted she was no longer taking her home diltiazem 30mg  bid at that time   - no recent palpitations - compliant with meds - some hematuria recently, recent UTI. Resolved     2. Chronic LBBB     3. COPD     4. CKD 3     5. Pulmonary HTN - 11/2021 echo: LVEF  60-65%, severe pulm HTN PASP 61. RV low normal - no SOB/DOE - in discussions with patient and family due to advanced age and lack of symptoms have not pursued extensive additional testing.   6. HTN - compliant with meds  7. Left leg swelling - prior hip surgery on that side Past Medical History:  Diagnosis Date   Aortic insufficiency    Atrial flutter (HCC)    Chronic kidney disease, stage 2 (mild)    stage 2-3   Chronic UTI    COPD (chronic obstructive pulmonary disease) (HCC)    Dizziness    Essential hypertension    GERD (gastroesophageal reflux disease)    IBS (irritable bowel syndrome)    LBBB (left bundle [place] block)    Mild carotid artery disease (HCC)    PAF (paroxysmal atrial fibrillation) (HCC)    Pulmonary hypertension (HCC)    Renal disorder    renal insufficiency   Tricuspid regurgitation      Allergies  Allergen Reactions   Levaquin [Levofloxacin In D5w] Itching    Lower extremities   Levofloxacin    Penicillins Nausea Only    Has patient had a PCN reaction causing immediate rash, facial/tongue/throat swelling, SOB or lightheadedness with hypotension: No Has patient had a PCN reaction causing severe rash involving mucus membranes or skin necrosis: No Has patient had a PCN reaction that required hospitalization: No Has patient had a PCN reaction occurring within the last 10 years: No If all of the above  answers are "NO", then may proceed with Cephalosporin use.    Sulfa Antibiotics Hives   Tequin [Gatifloxacin] Itching    Lower extremities   Latex Itching and Rash     Current Outpatient Medications  Medication Sig Dispense Refill   acetaminophen (TYLENOL) 650 MG CR tablet Take 650 mg by mouth every 6 (six) hours.     albuterol (VENTOLIN HFA) 108 (90 Base) MCG/ACT inhaler Inhale 1-2 puffs into the lungs every 6 (six) hours as needed for wheezing or shortness of breath. 6.7 g 0   Balsam [place]-Castor Oil (VENELEX) OINT Topical Special Instructions: Apply to bilateral buttocks, coccyx and sacrum qshift for prevention. Every Shift (Patient not taking: Reported on 05/09/2022)     cyanocobalamin (,VITAMIN B-12,) 1000 MCG/ML injection Inject into the muscle.     diltiazem (CARDIZEM CD) 180 MG 24 hr capsule TAKE ONE CAPSULE BY MOUTH ONCE DAILY 30 capsule 2   diltiazem (CARDIZEM) 30 MG tablet Take 1 tablet (30 mg total) by mouth daily as needed (As needed for palpitations). (Patient not taking: Reported on 05/09/2022) 90 tablet 3   docusate sodium (COLACE) 100 MG capsule Take 100 mg by mouth daily. (Patient not taking: Reported on 05/09/2022)     escitalopram (LEXAPRO) 5 MG tablet Take 1 tablet (5 mg total) by mouth daily. 30  tablet 0   ferrous sulfate 325 (65 FE) MG tablet Take 1 tablet (325 mg total) by mouth daily. 30 tablet 0   fluticasone (FLONASE) 50 MCG/ACT nasal spray Place 1 spray into both nostrils daily. 9.9 mL 0   loperamide (IMODIUM) 2 MG capsule Take 2 mg by mouth as needed for diarrhea or loose stools.     Multiple Vitamins-Minerals (PRESERVISION AREDS PO) Take 1 tablet by mouth in the morning and at bedtime.     nitrofurantoin (MACRODANTIN) 50 MG capsule TAKE ONE CAPSULE BY MOUTH EVERYDAY AT BEDTIME 30 capsule 0   NON FORMULARY Diet:Regular     Nutritional Supplements (ENSURE ENLIVE PO) Take 237 mLs by mouth daily. (Patient not taking: Reported on 05/09/2022)     omeprazole (PRILOSEC) 20  MG capsule Take 20 mg by mouth daily.     ondansetron (ZOFRAN) 4 MG tablet Take 1 tablet (4 mg total) by mouth every 6 (six) hours as needed. for nausea 20 tablet 0   polyethylene glycol (MIRALAX / GLYCOLAX) 17 g packet Take 17 g by mouth daily. 14 each 0   RESTASIS 0.05 % ophthalmic emulsion Place 1 drop into both eyes 2 (two) times daily. 5.5 mL 0   Rivaroxaban (XARELTO) 15 MG TABS tablet Take 1 tablet (15 mg total) by mouth every morning. 30 tablet 0   traZODone (DESYREL) 50 MG tablet Take 1 tablet (50 mg total) by mouth at bedtime. 30 tablet 0   Trospium Chloride 60 MG CP24 TAKE ONE CAPSULE BY MOUTH EVERYDAY AT BEDTIME 30 capsule 11   Vibegron (GEMTESA) 75 MG TABS Take 1 capsule by mouth daily. 30 tablet 0   No current facility-administered medications for this visit.     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     HIP ARTHROPLASTY Left 11/12/2021   Procedure: ARTHROPLASTY BIPOLAR HIP (HEMIARTHROPLASTY);  Surgeon: Oliver Barre, MD;  Location: [facility];  Service: Orthopedics;  Laterality: Left;   JOINT REPLACEMENT       Allergies  Allergen Reactions   Levaquin [Levofloxacin In D5w] Itching    Lower extremities   Levofloxacin    Penicillins Nausea Only    Has patient had a PCN reaction causing immediate rash, facial/tongue/throat swelling, SOB or lightheadedness with hypotension: No Has patient had a PCN reaction causing severe rash involving mucus membranes or skin necrosis: No Has patient had a PCN reaction that required hospitalization: No Has patient had a PCN reaction occurring within the last 10 years: No If all of the above answers are "NO", then may proceed with Cephalosporin use.    Sulfa Antibiotics Hives   Tequin [Gatifloxacin] Itching    Lower extremities   Latex Itching and Rash      Family History  Problem Relation Age of Onset   Hypertension Mother      Social History Diana Mata  reports that she has never smoked. She has never used smokeless tobacco. Diana Mata reports no history of alcohol use.   Review of Systems CONSTITUTIONAL: No weight loss, fever, chills, weakness or fatigue.  HEENT: Eyes: No visual loss, blurred vision, double vision or yellow sclerae.No hearing loss, sneezing, congestion, runny nose or sore throat.  SKIN: No rash or itching.  CARDIOVASCULAR: per hpi RESPIRATORY: No shortness of breath, cough or sputum.  GASTROINTESTINAL: No anorexia, nausea, vomiting or diarrhea. No abdominal pain or blood.  GENITOURINARY: No burning on urination, no polyuria NEUROLOGICAL:  No headache, dizziness, syncope, paralysis, ataxia, numbness or tingling in the extremities. No change in bowel or bladder control.  MUSCULOSKELETAL: No muscle, back pain, joint pain or stiffness.  LYMPHATICS: No enlarged nodes. No history of splenectomy.  PSYCHIATRIC: No history of depression or anxiety.  ENDOCRINOLOGIC: No reports of sweating, cold or heat intolerance. No polyuria or polydipsia.  Marland Kitchen   Physical Examination Today's Vitals   01/19/23 1335  BP: 110/60  Pulse: 74  SpO2: 96%  Weight: 122 lb 6.4 oz (55.5 kg)  Height: 5\' 4"  (1.626 m)   Body mass index is 21.01 kg/m.  Gen: resting comfortably, no acute distress HEENT: no scleral icterus, pupils equal round and reactive, no palptable cervical adenopathy,  CV: RRR, no m/r,g no jvd Resp: Clear to auscultation bilaterally GI: abdomen is soft, non-tender, non-distended, normal bowel sounds, no hepatosplenomegaly MSK: extremities are warm, 1+ left leg edema  Skin: warm, no rash Neuro:  no focal deficits Psych: appropriate affect   Diagnostic Studies     Assessment and Plan  PAF/acquired thrombophilia - no symptoms, continue current meds including eliquis for stroke prevention   2. HTN - at goal, continue current meds   Antoine Poche, M.D.

## 2023-01-23 ENCOUNTER — Other Ambulatory Visit: Payer: Self-pay | Admitting: Cardiology

## 2023-01-31 ENCOUNTER — Ambulatory Visit (INDEPENDENT_AMBULATORY_CARE_PROVIDER_SITE_OTHER): Payer: No Typology Code available for payment source | Admitting: Orthopedic Surgery

## 2023-01-31 ENCOUNTER — Other Ambulatory Visit (INDEPENDENT_AMBULATORY_CARE_PROVIDER_SITE_OTHER): Payer: No Typology Code available for payment source

## 2023-01-31 ENCOUNTER — Encounter: Payer: Self-pay | Admitting: Orthopedic Surgery

## 2023-01-31 VITALS — Ht 64.0 in | Wt 124.0 lb

## 2023-01-31 DIAGNOSIS — S72002D Fracture of unspecified part of neck of left femur, subsequent encounter for closed fracture with routine healing: Secondary | ICD-10-CM

## 2023-01-31 DIAGNOSIS — M7062 Trochanteric bursitis, left hip: Secondary | ICD-10-CM | POA: Diagnosis not present

## 2023-01-31 DIAGNOSIS — M25552 Pain in left hip: Secondary | ICD-10-CM

## 2023-01-31 NOTE — Progress Notes (Signed)
Orthopaedic Postop Note  Assessment: Diana Mata is a 89 y.o. female s/p Left hip hemiarthroplasty, with placement of cerclage cable  DOS: 11/12/2021  Plan: Diana Mata had a left hip hemiarthroplasty a little over a year ago.  For the past couple of months, she has had pain over the lateral hip.  She has exquisite tenderness to palpation over the greater trochanter.  Radiographs of her left hip were obtained in clinic today.  Prosthesis remains in stable position.  There has been no migration of the cable.  Current pain likely represents some irritation over the greater trochanter, consistent with bursitis.  We discussed an injection, and she elected to proceed.  We also discussed the possibility of proceeding with physical therapy, but she was not interested.  Procedure note injection - Left lateral hip   Verbal consent was obtained to inject the Left lateral hip.  Patient localized the pain. Timeout was completed to confirm the site of injection.  The skin was prepped with alcohol and ethyl chloride was sprayed at the injection site.  A 21-gauge needle was used to inject 40 mg of Depo-Medrol and 1% lidocaine (4 cc) into the Left lateral hip, directly over the localized tenderness using a direct lateral approach.  There were no complications. A sterile bandage was applied.   Follow-up: No follow-ups on file. XR at next visit: [facility] pelvis and Left hip  Subjective:  Chief Complaint  Patient presents with   Routine Post Op    L hip     History of Present Illness: Diana Mata is a 89 y.o. female who presents following the above stated procedure.  Surgery was over a year ago.  Her immediate recovery was uneventful.  Over the past couple months, she started to complain more more about pain over the lateral hip.  No specific injury.  According to her daughter, she did fall a few months ago, but did not complain of pain at that time.  She has tenderness over the lateral  hip, and has difficulty laying on that hip.  She does have some occasional pain in the left buttock area.  She continues to ambulate with assistance of a cane, and feels unsteady overall.  Review of Systems: No fevers or chills No numbness or tingling No Chest Pain No shortness of breath   Objective: Ht 5\' 4"  (1.626 m)   Wt 124 lb (56.2 kg)   BMI 21.28 kg/m   Physical Exam:  Alert and oriented.  Elderly female.  Surgical incision is healed.  No surrounding erythema or drainage.  She has exquisite tenderness to palpation directly over the greater trochanter.  She tolerates gentle range of motion of the hip.  No pain with axial loading.  She ambulates with the assistance of a cane.  IMAGING: I personally ordered and reviewed the following images:  AP pelvis and left hip x-rays demonstrates a well-positioned left hip hemiarthroplasty.  Hip is reduced.  Cerclage cable remains intact, without migration.  There is no obvious bone reaction around the cerclage.  No lucency around the prosthesis.  No bony lesions.  Impression: Well-positioned left hip hemiarthroplasty with cerclage cable in unchanged position.   Oliver Barre, MD 01/31/2023 1:55 PM

## 2023-01-31 NOTE — Patient Instructions (Signed)

## 2023-02-09 DIAGNOSIS — E538 Deficiency of other specified B group vitamins: Secondary | ICD-10-CM | POA: Diagnosis not present

## 2023-03-16 DIAGNOSIS — E538 Deficiency of other specified B group vitamins: Secondary | ICD-10-CM | POA: Diagnosis not present

## 2023-04-03 IMAGING — DX DG CHEST 2V
2 series · 2 of 2 positions shown · non-contrast
Comparison: 06/06/2018

CLINICAL DATA: Atrial fibrillation, irregular heart rate today,
history COPD

EXAM:
CHEST - 2 VIEW

[chest lat]
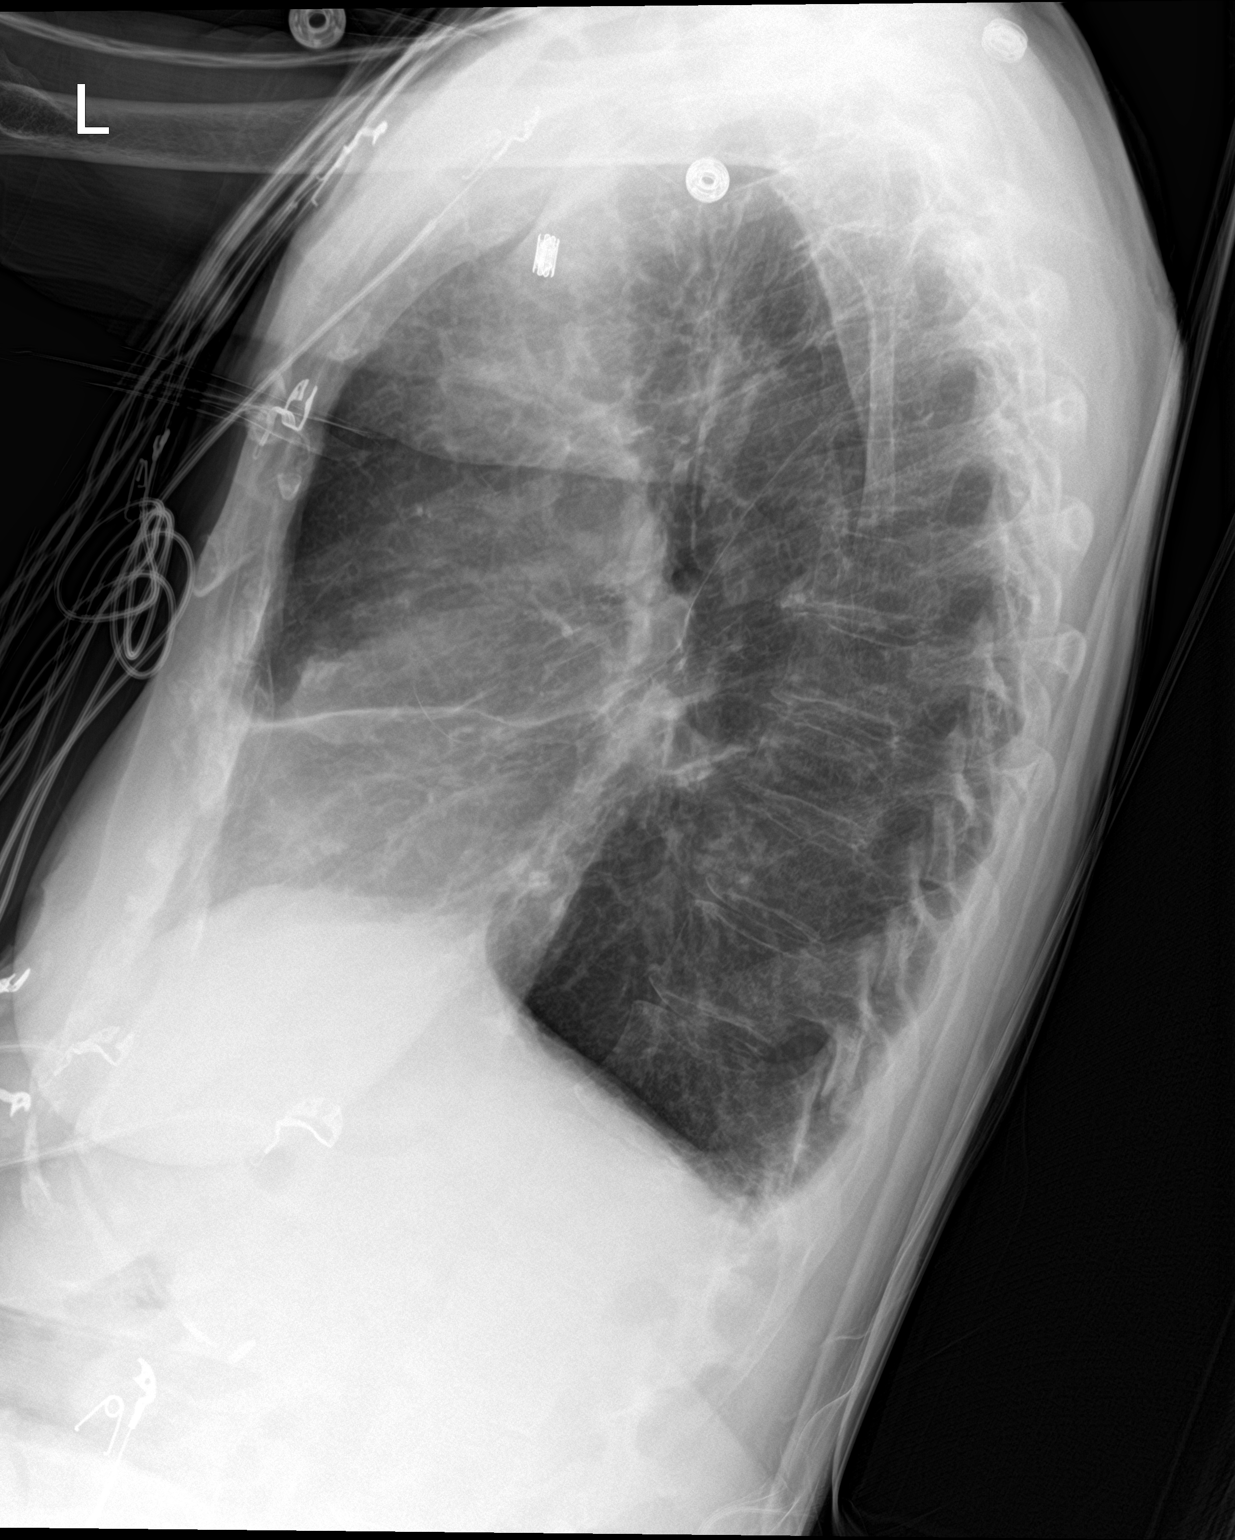

[chest ap]
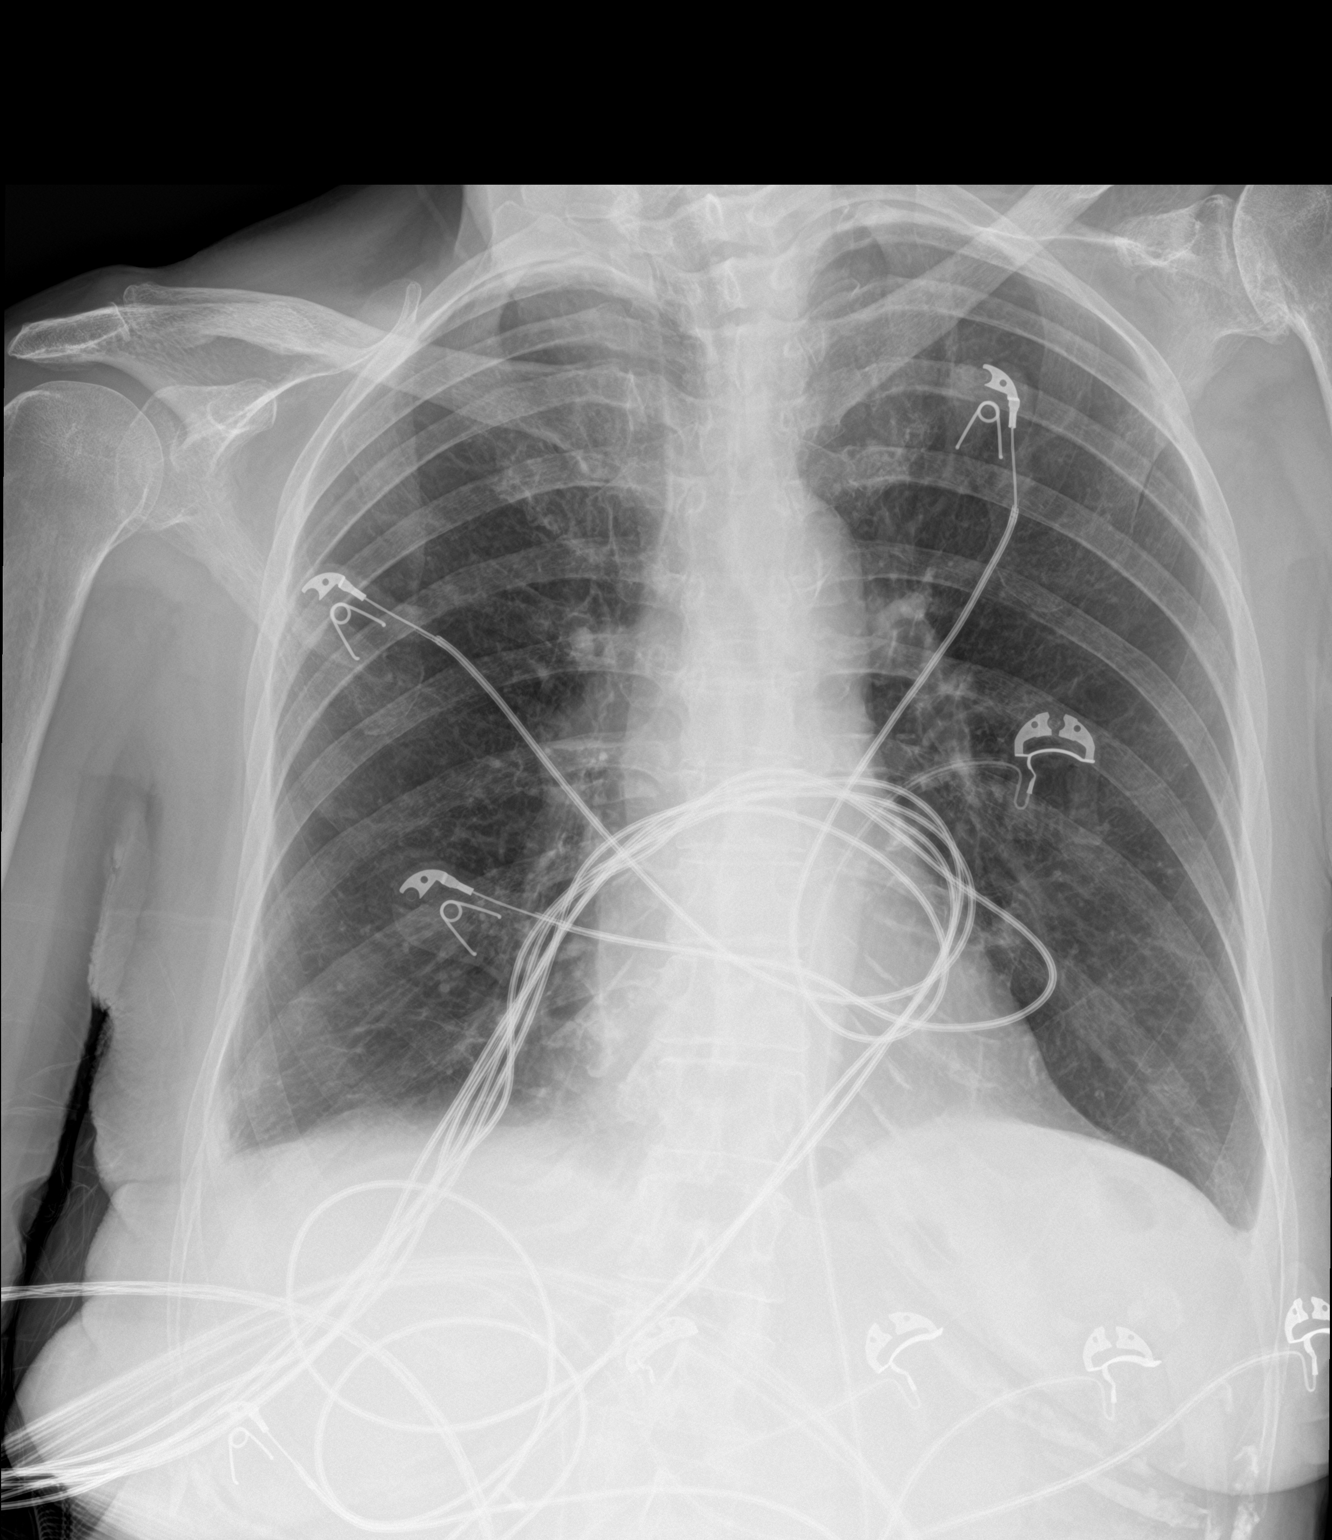

[2 of 2 positions shown; findings below may reference images not displayed]

FINDINGS: Normal heart size, mediastinal contours, and pulmonary vascularity.

Atherosclerotic calcification aorta.

Emphysematous and bronchitic changes consistent with COPD.

Mild RIGHT basilar atelectasis and tiny bibasilar pleural effusions.

No acute infiltrate or pneumothorax.

Bones demineralized.
IMPRESSION: COPD changes with tiny bibasilar pleural effusions and mild RIGHT
basilar atelectasis.

Aortic Atherosclerosis (ZGE4F-XH7.7) and Emphysema (ZGE4F-0PE.1).

## 2023-05-11 DIAGNOSIS — E538 Deficiency of other specified B group vitamins: Secondary | ICD-10-CM | POA: Diagnosis not present

## 2023-05-11 DIAGNOSIS — Z23 Encounter for immunization: Secondary | ICD-10-CM | POA: Diagnosis not present

## 2023-06-06 DIAGNOSIS — E538 Deficiency of other specified B group vitamins: Secondary | ICD-10-CM | POA: Diagnosis not present

## 2023-07-06 DIAGNOSIS — E538 Deficiency of other specified B group vitamins: Secondary | ICD-10-CM | POA: Diagnosis not present

## 2023-07-20 ENCOUNTER — Ambulatory Visit: Payer: No Typology Code available for payment source | Admitting: Cardiology

## 2023-07-20 NOTE — Progress Notes (Deleted)
Clinical Summary Diana Mata is a 89 y.o.female  seen today for follow up of the following medical problems.        PAF - notes mentino prior history of paroxsmal afib/aflutter - recent admission 02/2021 - from notes was controlled on dilt gtt, self converted - it was noted she was no longer taking her home diltiazem 30mg  bid at that time     - no recent palpitations - compliant with meds - some hematuria recently, recent UTI. Resolved       2. Chronic LBBB     3. COPD     4. CKD 3     5. Pulmonary HTN - 11/2021 echo: LVEF  60-65%, severe pulm HTN PASP 61. RV low normal - no SOB/DOE - in discussions with patient and family due to advanced age and lack of symptoms have not pursued extensive additional testing.     6. HTN - compliant with meds   7. Left leg swelling - prior hip surgery on that side Past Medical History:  Diagnosis Date   Aortic insufficiency    Atrial flutter (HCC)    Chronic kidney disease, stage 2 (mild)    stage 2-3   Chronic UTI    COPD (chronic obstructive pulmonary disease) (HCC)    Dizziness    Essential hypertension    GERD (gastroesophageal reflux disease)    IBS (irritable bowel syndrome)    LBBB (left bundle [place] block)    Mild carotid artery disease (HCC)    PAF (paroxysmal atrial fibrillation) (HCC)    Pulmonary hypertension (HCC)    Renal disorder    renal insufficiency   Tricuspid regurgitation      Allergies  Allergen Reactions   Levaquin [Levofloxacin In D5w] Itching    Lower extremities   Levofloxacin    Penicillins Nausea Only    Has patient had a PCN reaction causing immediate rash, facial/tongue/throat swelling, SOB or lightheadedness with hypotension: No Has patient had a PCN reaction causing severe rash involving mucus membranes or skin necrosis: No Has patient had a PCN reaction that required hospitalization: No Has patient had a PCN reaction occurring within the last 10 years: No If all of  the above answers are "NO", then may proceed with Cephalosporin use.    Sulfa Antibiotics Hives   Tequin [Gatifloxacin] Itching    Lower extremities   Latex Itching and Rash     Current Outpatient Medications  Medication Sig Dispense Refill   acetaminophen (TYLENOL) 650 MG CR tablet Take 650 mg by mouth every 6 (six) hours.     albuterol (VENTOLIN HFA) 108 (90 Base) MCG/ACT inhaler Inhale 1-2 puffs into the lungs every 6 (six) hours as needed for wheezing or shortness of breath. 6.7 g 0   cyanocobalamin (,VITAMIN B-12,) 1000 MCG/ML injection Inject into the muscle.     diltiazem (CARDIZEM CD) 180 MG 24 hr capsule TAKE ONE CAPSULE BY MOUTH ONCE DAILY 90 capsule 3   diltiazem (CARDIZEM) 30 MG tablet Take 1 tablet (30 mg total) by mouth daily as needed (As needed for palpitations). 90 tablet 3   docusate sodium (COLACE) 100 MG capsule Take 100 mg by mouth daily.     escitalopram (LEXAPRO) 5 MG tablet Take 1 tablet (5 mg total) by mouth daily. 30 tablet 0   ferrous sulfate 325 (65 FE) MG tablet Take 1 tablet (325 mg total) by mouth daily. 30 tablet 0   fluticasone (FLONASE) 50 MCG/ACT nasal spray  Place 1 spray into both nostrils daily. 9.9 mL 0   loperamide (IMODIUM) 2 MG capsule Take 2 mg by mouth as needed for diarrhea or loose stools.     Multiple Vitamins-Minerals (PRESERVISION AREDS PO) Take 1 tablet by mouth in the morning and at bedtime.     nitrofurantoin (MACRODANTIN) 50 MG capsule TAKE ONE CAPSULE BY MOUTH EVERYDAY AT BEDTIME 30 capsule 0   NON FORMULARY Diet:Regular     omeprazole (PRILOSEC) 20 MG capsule Take 20 mg by mouth daily.     ondansetron (ZOFRAN) 4 MG tablet Take 1 tablet (4 mg total) by mouth every 6 (six) hours as needed. for nausea 20 tablet 0   polyethylene glycol (MIRALAX / GLYCOLAX) 17 g packet Take 17 g by mouth daily. 14 each 0   RESTASIS 0.05 % ophthalmic emulsion Place 1 drop into both eyes 2 (two) times daily. 5.5 mL 0   Rivaroxaban (XARELTO) 15 MG TABS tablet  Take 1 tablet (15 mg total) by mouth every morning. 30 tablet 0   traZODone (DESYREL) 50 MG tablet Take 1 tablet (50 mg total) by mouth at bedtime. 30 tablet 0   Trospium Chloride 60 MG CP24 TAKE ONE CAPSULE BY MOUTH EVERYDAY AT BEDTIME 30 capsule 11   Vibegron (GEMTESA) 75 MG TABS Take 1 capsule by mouth daily. 30 tablet 0   No current facility-administered medications for this visit.     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     HIP ARTHROPLASTY Left 11/12/2021   Procedure: ARTHROPLASTY BIPOLAR HIP (HEMIARTHROPLASTY);  Surgeon: Oliver Barre, MD;  Location: [facility];  Service: Orthopedics;  Laterality: Left;   JOINT REPLACEMENT       Allergies  Allergen Reactions   Levaquin [Levofloxacin In D5w] Itching    Lower extremities   Levofloxacin    Penicillins Nausea Only    Has patient had a PCN reaction causing immediate rash, facial/tongue/throat swelling, SOB or lightheadedness with hypotension: No Has patient had a PCN reaction causing severe rash involving mucus membranes or skin necrosis: No Has patient had a PCN reaction that required hospitalization: No Has patient had a PCN reaction occurring within the last 10 years: No If all of the above answers are "NO", then may proceed with Cephalosporin use.    Sulfa Antibiotics Hives   Tequin [Gatifloxacin] Itching    Lower extremities   Latex Itching and Rash      Family History  Problem Relation Age of Onset   Hypertension Mother      Social History Diana Mata reports that she has never smoked. She has never used smokeless tobacco. Diana Mata reports no history of alcohol use.   Review of Systems CONSTITUTIONAL: No weight loss, fever, chills, weakness or fatigue.  HEENT: Eyes: No visual loss, blurred vision, double vision or yellow sclerae.No hearing loss, sneezing, congestion, runny nose or sore throat.  SKIN: No rash or itching.   CARDIOVASCULAR:  RESPIRATORY: No shortness of breath, cough or sputum.  GASTROINTESTINAL: No anorexia, nausea, vomiting or diarrhea. No abdominal pain or blood.  GENITOURINARY: No burning on urination, no polyuria NEUROLOGICAL: No headache, dizziness, syncope, paralysis, ataxia, numbness or tingling in the extremities. No change in bowel or bladder control.  MUSCULOSKELETAL: No muscle, back pain, joint pain or stiffness.  LYMPHATICS: No enlarged nodes. No history of splenectomy.  PSYCHIATRIC: No history of depression or anxiety.  ENDOCRINOLOGIC: No reports of sweating, cold  or heat intolerance. No polyuria or polydipsia.  Marland Kitchen   Physical Examination There were no vitals filed for this visit. There were no vitals filed for this visit.  Gen: resting comfortably, no acute distress HEENT: no scleral icterus, pupils equal round and reactive, no palptable cervical adenopathy,  CV Resp: Clear to auscultation bilaterally GI: abdomen is soft, non-tender, non-distended, normal bowel sounds, no hepatosplenomegaly MSK: extremities are warm, no edema.  Skin: warm, no rash Neuro:  no focal deficits Psych: appropriate affect   Diagnostic Studies     Assessment and Plan   PAF/acquired thrombophilia - no symptoms, continue current meds including eliquis for stroke prevention     2. HTN - at goal, continue current meds     Antoine Poche, M.D.

## 2023-07-27 ENCOUNTER — Ambulatory Visit: Payer: No Typology Code available for payment source | Admitting: Cardiology

## 2023-07-27 NOTE — Progress Notes (Deleted)
Clinical Summary Diana Mata is a 89 y.o.female  PAF - notes mentino prior history of paroxsmal afib/aflutter - recent admission 02/2021 - from notes was controlled on dilt gtt, self converted - it was noted she was no longer taking her home diltiazem 30mg  bid at that time     - no recent palpitations - compliant with meds - some hematuria recently, recent UTI. Resolved       2. Chronic LBBB     3. COPD     4. CKD 3     5. Pulmonary HTN - 11/2021 echo: LVEF  60-65%, severe pulm HTN PASP 61. RV low normal - no SOB/DOE - in discussions with patient and family due to advanced age and lack of symptoms have not pursued extensive additional testing.     6. HTN - compliant with meds   7. Left leg swelling - prior hip surgery on that side Past Medical History:  Diagnosis Date   Aortic insufficiency    Atrial flutter (HCC)    Chronic kidney disease, stage 2 (mild)    stage 2-3   Chronic UTI    COPD (chronic obstructive pulmonary disease) (HCC)    Dizziness    Essential hypertension    GERD (gastroesophageal reflux disease)    IBS (irritable bowel syndrome)    LBBB (left bundle Diana Mata block)    Mild carotid artery disease (HCC)    PAF (paroxysmal atrial fibrillation) (HCC)    Pulmonary hypertension (HCC)    Renal disorder    renal insufficiency   Tricuspid regurgitation      Allergies  Allergen Reactions   Levaquin [Levofloxacin In D5w] Itching    Lower extremities   Levofloxacin    Penicillins Nausea Only    Has patient had a PCN reaction causing immediate rash, facial/tongue/throat swelling, SOB or lightheadedness with hypotension: No Has patient had a PCN reaction causing severe rash involving mucus membranes or skin necrosis: No Has patient had a PCN reaction that required hospitalization: No Has patient had a PCN reaction occurring within the last 10 years: No If all of the above answers are "NO", then may proceed with Cephalosporin use.     Sulfa Antibiotics Hives   Tequin [Gatifloxacin] Itching    Lower extremities   Latex Itching and Rash     Current Outpatient Medications  Medication Sig Dispense Refill   acetaminophen (TYLENOL) 650 MG CR tablet Take 650 mg by mouth every 6 (six) hours.     albuterol (VENTOLIN HFA) 108 (90 Base) MCG/ACT inhaler Inhale 1-2 puffs into the lungs every 6 (six) hours as needed for wheezing or shortness of breath. 6.7 g 0   cyanocobalamin (,VITAMIN B-12,) 1000 MCG/ML injection Inject into the muscle.     diltiazem (CARDIZEM CD) 180 MG 24 hr capsule TAKE ONE CAPSULE BY MOUTH ONCE DAILY 90 capsule 3   diltiazem (CARDIZEM) 30 MG tablet Take 1 tablet (30 mg total) by mouth daily as needed (As needed for palpitations). 90 tablet 3   docusate sodium (COLACE) 100 MG capsule Take 100 mg by mouth daily.     escitalopram (LEXAPRO) 5 MG tablet Take 1 tablet (5 mg total) by mouth daily. 30 tablet 0   ferrous sulfate 325 (65 FE) MG tablet Take 1 tablet (325 mg total) by mouth daily. 30 tablet 0   fluticasone (FLONASE) 50 MCG/ACT nasal spray Place 1 spray into both nostrils daily. 9.9 mL 0   loperamide (IMODIUM) 2 MG capsule  Take 2 mg by mouth as needed for diarrhea or loose stools.     Multiple Vitamins-Minerals (PRESERVISION AREDS PO) Take 1 tablet by mouth in the morning and at bedtime.     nitrofurantoin (MACRODANTIN) 50 MG capsule TAKE ONE CAPSULE BY MOUTH EVERYDAY AT BEDTIME 30 capsule 0   NON FORMULARY Diet:Regular     omeprazole (PRILOSEC) 20 MG capsule Take 20 mg by mouth daily.     ondansetron (ZOFRAN) 4 MG tablet Take 1 tablet (4 mg total) by mouth every 6 (six) hours as needed. for nausea 20 tablet 0   polyethylene glycol (MIRALAX / GLYCOLAX) 17 g packet Take 17 g by mouth daily. 14 each 0   RESTASIS 0.05 % ophthalmic emulsion Place 1 drop into both eyes 2 (two) times daily. 5.5 mL 0   Rivaroxaban (XARELTO) 15 MG TABS tablet Take 1 tablet (15 mg total) by mouth every morning. 30 tablet 0    traZODone (DESYREL) 50 MG tablet Take 1 tablet (50 mg total) by mouth at bedtime. 30 tablet 0   Trospium Chloride 60 MG CP24 TAKE ONE CAPSULE BY MOUTH EVERYDAY AT BEDTIME 30 capsule 11   Vibegron (GEMTESA) 75 MG TABS Take 1 capsule by mouth daily. 30 tablet 0   No current facility-administered medications for this visit.     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     HIP ARTHROPLASTY Left 11/12/2021   Procedure: ARTHROPLASTY BIPOLAR HIP (HEMIARTHROPLASTY);  Surgeon: Diana Barre, MD;  Location: [facility];  Service: Orthopedics;  Laterality: Left;   JOINT REPLACEMENT       Allergies  Allergen Reactions   Levaquin [Levofloxacin In D5w] Itching    Lower extremities   Levofloxacin    Penicillins Nausea Only    Has patient had a PCN reaction causing immediate rash, facial/tongue/throat swelling, SOB or lightheadedness with hypotension: No Has patient had a PCN reaction causing severe rash involving mucus membranes or skin necrosis: No Has patient had a PCN reaction that required hospitalization: No Has patient had a PCN reaction occurring within the last 10 years: No If all of the above answers are "NO", then may proceed with Cephalosporin use.    Sulfa Antibiotics Hives   Tequin [Gatifloxacin] Itching    Lower extremities   Latex Itching and Rash      Family History  Problem Relation Age of Onset   Hypertension Mother      Social History Diana Mata reports that she has never smoked. She has never used smokeless tobacco. Diana Mata reports no history of alcohol use.   Review of Systems CONSTITUTIONAL: No weight loss, fever, chills, weakness or fatigue.  HEENT: Eyes: No visual loss, blurred vision, double vision or yellow sclerae.No hearing loss, sneezing, congestion, runny nose or sore throat.  SKIN: No rash or itching.  CARDIOVASCULAR:  RESPIRATORY: No shortness of breath, cough or sputum.   GASTROINTESTINAL: No anorexia, nausea, vomiting or diarrhea. No abdominal pain or blood.  GENITOURINARY: No burning on urination, no polyuria NEUROLOGICAL: No headache, dizziness, syncope, paralysis, ataxia, numbness or tingling in the extremities. No change in bowel or bladder control.  MUSCULOSKELETAL: No muscle, back pain, joint pain or stiffness.  LYMPHATICS: No enlarged nodes. No history of splenectomy.  PSYCHIATRIC: No history of depression or anxiety.  ENDOCRINOLOGIC: No reports of sweating, cold or heat intolerance. No polyuria or polydipsia.  Marland Kitchen   Physical Examination There were no vitals  filed for this visit. There were no vitals filed for this visit.  Gen: resting comfortably, no acute distress HEENT: no scleral icterus, pupils equal round and reactive, no palptable cervical adenopathy,  CV Resp: Clear to auscultation bilaterally GI: abdomen is soft, non-tender, non-distended, normal bowel sounds, no hepatosplenomegaly MSK: extremities are warm, no edema.  Skin: warm, no rash Neuro:  no focal deficits Psych: appropriate affect   Diagnostic Studies     Assessment and Plan   PAF/acquired thrombophilia - no symptoms, continue current meds including eliquis for stroke prevention     2. HTN - at goal, continue current meds     Antoine Poche, M.D., F.A.C.C.

## 2023-08-10 DIAGNOSIS — E538 Deficiency of other specified B group vitamins: Secondary | ICD-10-CM | POA: Diagnosis not present

## 2023-09-07 DIAGNOSIS — E538 Deficiency of other specified B group vitamins: Secondary | ICD-10-CM | POA: Diagnosis not present

## 2023-10-11 ENCOUNTER — Ambulatory Visit: Payer: Self-pay | Admitting: Cardiology

## 2023-10-11 NOTE — Progress Notes (Deleted)
 Clinical Summary [name] is a 89 y.o.female  seen today for follow up of the following medical problems.        PAF - notes mentino prior history of paroxsmal afib/aflutter - recent admission 02/2021 - from notes was controlled on dilt gtt, self converted - it was noted she was no longer taking her home diltiazem  30mg  bid at that time     - no recent palpitations - compliant with meds - some hematuria recently, recent UTI. Resolved       2. Chronic LBBB     3. COPD     4. CKD 3     5. Pulmonary HTN - 11/2021 echo: LVEF  60-65%, severe pulm HTN PASP 61. RV low normal - no SOB/DOE - in discussions with patient and family due to advanced age and lack of symptoms have not pursued extensive additional testing.     6. HTN - compliant with meds   7. Left leg swelling - prior hip surgery on that side Past Medical History:  Diagnosis Date   Aortic insufficiency    Atrial flutter (HCC)    Chronic kidney disease, stage 2 (mild)    stage 2-3   Chronic UTI    COPD (chronic obstructive pulmonary disease) (HCC)    Dizziness    Essential hypertension    GERD (gastroesophageal reflux disease)    IBS (irritable bowel syndrome)    LBBB (left bundle Asier Desroches block)    Mild carotid artery disease (HCC)    PAF (paroxysmal atrial fibrillation) (HCC)    Pulmonary hypertension (HCC)    Renal disorder    renal insufficiency   Tricuspid regurgitation      Allergies  Allergen Reactions   Levaquin [Levofloxacin In D5w] Itching    Lower extremities   Levofloxacin    Penicillins Nausea Only    Has patient had a PCN reaction causing immediate rash, facial/tongue/throat swelling, SOB or lightheadedness with hypotension: No Has patient had a PCN reaction causing severe rash involving mucus membranes or skin necrosis: No Has patient had a PCN reaction that required hospitalization: No Has patient had a PCN reaction occurring within the last 10 years: No If all of  the above answers are NO, then may proceed with Cephalosporin use.    Sulfa Antibiotics Hives   Tequin [Gatifloxacin] Itching    Lower extremities   Latex Itching and Rash     Current Outpatient Medications  Medication Sig Dispense Refill   acetaminophen  (TYLENOL ) 650 MG CR tablet Take 650 mg by mouth every 6 (six) hours.     albuterol  (VENTOLIN  HFA) 108 (90 Base) MCG/ACT inhaler Inhale 1-2 puffs into the lungs every 6 (six) hours as needed for wheezing or shortness of breath. 6.7 g 0   cyanocobalamin (,VITAMIN B-12,) 1000 MCG/ML injection Inject into the muscle.     diltiazem  (CARDIZEM  CD) 180 MG 24 hr capsule TAKE ONE CAPSULE BY MOUTH ONCE DAILY 90 capsule 3   diltiazem  (CARDIZEM ) 30 MG tablet Take 1 tablet (30 mg total) by mouth daily as needed (As needed for palpitations). 90 tablet 3   docusate sodium (COLACE) 100 MG capsule Take 100 mg by mouth daily.     escitalopram  (LEXAPRO ) 5 MG tablet Take 1 tablet (5 mg total) by mouth daily. 30 tablet 0   ferrous sulfate  325 (65 FE) MG tablet Take 1 tablet (325 mg total) by mouth daily. 30 tablet 0   fluticasone  (FLONASE ) 50 MCG/ACT nasal spray  Place 1 spray into both nostrils daily. 9.9 mL 0   loperamide  (IMODIUM ) 2 MG capsule Take 2 mg by mouth as needed for diarrhea or loose stools.     Multiple Vitamins-Minerals (PRESERVISION AREDS PO) Take 1 tablet by mouth in the morning and at bedtime.     nitrofurantoin  (MACRODANTIN ) 50 MG capsule TAKE ONE CAPSULE BY MOUTH EVERYDAY AT BEDTIME 30 capsule 0   NON FORMULARY Diet:Regular     omeprazole (PRILOSEC) 20 MG capsule Take 20 mg by mouth daily.     ondansetron  (ZOFRAN ) 4 MG tablet Take 1 tablet (4 mg total) by mouth every 6 (six) hours as needed. for nausea 20 tablet 0   polyethylene glycol (MIRALAX  / GLYCOLAX ) 17 g packet Take 17 g by mouth daily. 14 each 0   RESTASIS  0.05 % ophthalmic emulsion Place 1 drop into both eyes 2 (two) times daily. 5.5 mL 0   Rivaroxaban  (XARELTO ) 15 MG TABS tablet  Take 1 tablet (15 mg total) by mouth every morning. 30 tablet 0   traZODone  (DESYREL ) 50 MG tablet Take 1 tablet (50 mg total) by mouth at bedtime. 30 tablet 0   Trospium  Chloride 60 MG CP24 TAKE ONE CAPSULE BY MOUTH EVERYDAY AT BEDTIME 30 capsule 11   Vibegron  (GEMTESA ) 75 MG TABS Take 1 capsule by mouth daily. 30 tablet 0   No current facility-administered medications for this visit.     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     HIP ARTHROPLASTY Left 11/12/2021   Procedure: ARTHROPLASTY BIPOLAR HIP (HEMIARTHROPLASTY);  Surgeon: Onesimo Oneil LABOR, MD;  Location: [facility];  Service: Orthopedics;  Laterality: Left;   JOINT REPLACEMENT       Allergies  Allergen Reactions   Levaquin [Levofloxacin In D5w] Itching    Lower extremities   Levofloxacin    Penicillins Nausea Only    Has patient had a PCN reaction causing immediate rash, facial/tongue/throat swelling, SOB or lightheadedness with hypotension: No Has patient had a PCN reaction causing severe rash involving mucus membranes or skin necrosis: No Has patient had a PCN reaction that required hospitalization: No Has patient had a PCN reaction occurring within the last 10 years: No If all of the above answers are NO, then may proceed with Cephalosporin use.    Sulfa Antibiotics Hives   Tequin [Gatifloxacin] Itching    Lower extremities   Latex Itching and Rash      Family History  Problem Relation Age of Onset   Hypertension Mother      Social History [name] reports that she has never smoked. She has never used smokeless tobacco. Ms. Peckenpaugh reports no history of alcohol use.   Review of Systems CONSTITUTIONAL: No weight loss, fever, chills, weakness or fatigue.  HEENT: Eyes: No visual loss, blurred vision, double vision or yellow sclerae.No hearing loss, sneezing, congestion, runny nose or sore throat.  SKIN: No rash or itching.   CARDIOVASCULAR:  RESPIRATORY: No shortness of breath, cough or sputum.  GASTROINTESTINAL: No anorexia, nausea, vomiting or diarrhea. No abdominal pain or blood.  GENITOURINARY: No burning on urination, no polyuria NEUROLOGICAL: No headache, dizziness, syncope, paralysis, ataxia, numbness or tingling in the extremities. No change in bowel or bladder control.  MUSCULOSKELETAL: No muscle, back pain, joint pain or stiffness.  LYMPHATICS: No enlarged nodes. No history of splenectomy.  PSYCHIATRIC: No history of depression or anxiety.  ENDOCRINOLOGIC: No reports of sweating, cold  or heat intolerance. No polyuria or polydipsia.  SABRA   Physical Examination There were no vitals filed for this visit. There were no vitals filed for this visit.  Gen: resting comfortably, no acute distress HEENT: no scleral icterus, pupils equal round and reactive, no palptable cervical adenopathy,  CV Resp: Clear to auscultation bilaterally GI: abdomen is soft, non-tender, non-distended, normal bowel sounds, no hepatosplenomegaly MSK: extremities are warm, no edema.  Skin: warm, no rash Neuro:  no focal deficits Psych: appropriate affect   Diagnostic Studies     Assessment and Plan  PAF/acquired thrombophilia - no symptoms, continue current meds including eliquis for stroke prevention     2. HTN - at goal, continue current meds        Dorn PHEBE Ross, M.D., F.A.C.C.

## 2023-10-12 DIAGNOSIS — E538 Deficiency of other specified B group vitamins: Secondary | ICD-10-CM | POA: Diagnosis not present

## 2023-11-09 DIAGNOSIS — B538 Other malaria, not elsewhere classified: Secondary | ICD-10-CM | POA: Diagnosis not present

## 2023-11-09 DIAGNOSIS — E538 Deficiency of other specified B group vitamins: Secondary | ICD-10-CM | POA: Diagnosis not present

## 2023-11-09 DIAGNOSIS — B370 Candidal stomatitis: Secondary | ICD-10-CM | POA: Diagnosis not present

## 2023-11-09 DIAGNOSIS — R059 Cough, unspecified: Secondary | ICD-10-CM | POA: Diagnosis not present

## 2023-11-09 DIAGNOSIS — J441 Chronic obstructive pulmonary disease with (acute) exacerbation: Secondary | ICD-10-CM | POA: Diagnosis not present

## 2023-11-19 NOTE — Progress Notes (Deleted)
  Cardiology Office Note:  .   Date:  11/19/2023  ID:  Tashera Montalvo, DOB 08-29-1931, MRN [id number] PCP: Benita Stabile, MD  [facility] Providers Cardiologist:  Dina Rich, MD { Click to update primary MD,subspecialty MD or APP then REFRESH:1}   History of Present Illness: .   Diana Mata is a 89 y.o. female with history of PAF, HTN, pulm HTN, CKD  ROS: ***  Studies Reviewed: Marland Kitchen         Prior CV Studies: {Select studies to display:26339}  Echo 11/2021 IMPRESSIONS     1. Poor acoustic windows and frequent ectopy make accurate evaluation of  LVEF difficult. OVerall LVEF appears normal with mild distal inferior  hypokinesis. . Left ventricular ejection fraction, by estimation, is 60 to  65%. The left ventricle has normal  function.   2. Right ventricular systolic function is low normal. The right  ventricular size is normal. There is severely elevated pulmonary artery  systolic pressure.   3. The mitral valve is normal in structure. Trivial mitral valve  regurgitation.   4. Tricuspid valve regurgitation is moderate.   5. The aortic valve is tricuspid. Aortic valve regurgitation is mild.   6. The inferior vena cava is normal in size with greater than 50%  respiratory variability, suggesting right atrial pressure of 3 mmHg.    Risk Assessment/Calculations:   {Does this patient have ATRIAL FIBRILLATION?:[id number]} No BP recorded.  {Refresh Note OR Click here to enter BP  :1}***       Physical Exam:   VS:  There were no vitals taken for this visit.   Wt Readings from Last 3 Encounters:  01/31/23 124 lb (56.2 kg)  01/19/23 122 lb 6.4 oz (55.5 kg)  05/09/22 115 lb (52.2 kg)    GEN: Well nourished, well developed in no acute distress NECK: No JVD; No carotid bruits CARDIAC: ***RRR, no murmurs, rubs, gallops RESPIRATORY:  Clear to auscultation without rales, wheezing or rhonchi  ABDOMEN: Soft, non-tender, non-distended EXTREMITIES:  No edema; No  deformity   ASSESSMENT AND PLAN: .    PAF on eliquis  Pulm HTN 11/2021 echo: LVEF  60-65%, severe pulm HTN PASP 61. RV low normal - no SOB/DOE -  due to advanced age and lack of symptoms have not pursued extensive additional testing.  HTN  Chronic LBBB  CKD 3  COPD     {Are you ordering a CV Procedure (e.g. stress test, cath, DCCV, TEE, etc)?   Press F2        :[id number]}  Dispo: ***  Signed, Jacolyn Reedy, PA-C

## 2023-11-22 DIAGNOSIS — J069 Acute upper respiratory infection, unspecified: Secondary | ICD-10-CM | POA: Diagnosis not present

## 2023-11-22 DIAGNOSIS — Z6821 Body mass index (BMI) 21.0-21.9, adult: Secondary | ICD-10-CM | POA: Diagnosis not present

## 2023-11-22 DIAGNOSIS — B001 Herpesviral vesicular dermatitis: Secondary | ICD-10-CM | POA: Diagnosis not present

## 2023-11-22 DIAGNOSIS — Z713 Dietary counseling and surveillance: Secondary | ICD-10-CM | POA: Diagnosis not present

## 2023-11-22 DIAGNOSIS — R630 Anorexia: Secondary | ICD-10-CM | POA: Diagnosis not present

## 2023-12-03 ENCOUNTER — Ambulatory Visit: Payer: Self-pay | Attending: Physician Assistant | Admitting: Physician Assistant

## 2023-12-03 ENCOUNTER — Encounter: Payer: Self-pay | Admitting: Physician Assistant

## 2023-12-07 DIAGNOSIS — E538 Deficiency of other specified B group vitamins: Secondary | ICD-10-CM | POA: Diagnosis not present

## 2024-01-09 IMAGING — DX DG HIP (WITH OR WITHOUT PELVIS) 2-3V*L*
3 series · 3 of 3 positions shown · non-contrast
Comparison: 11/10/2021.

CLINICAL DATA: 89-year-old female status post left hip surgery.

EXAM:
DG HIP (WITH OR WITHOUT PELVIS) 2-3V LEFT

[pelvis ap]
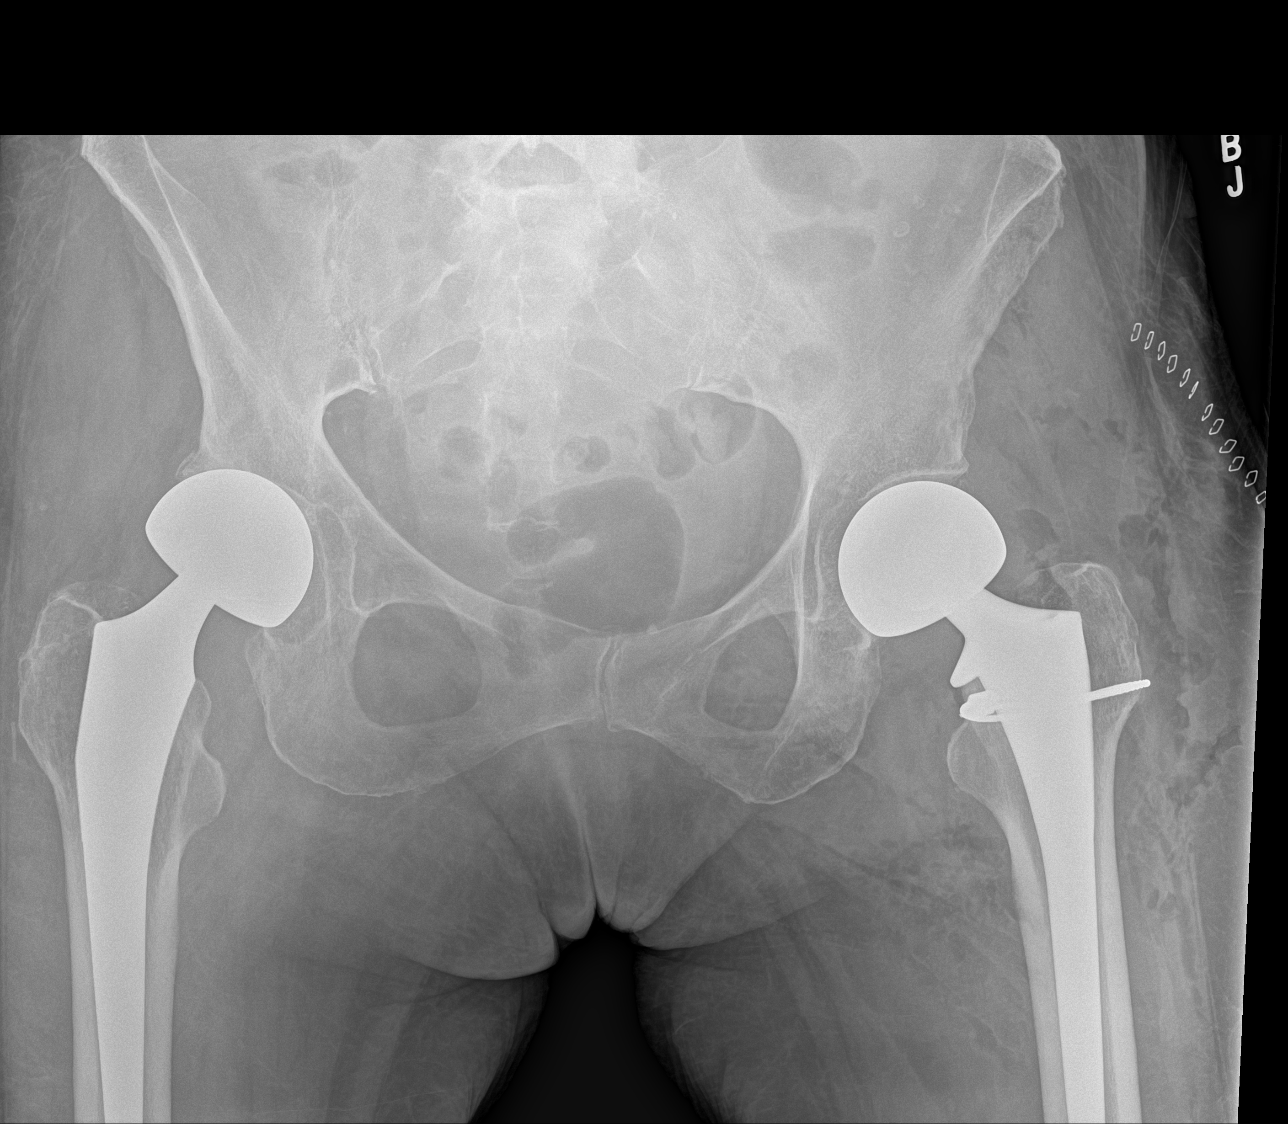

[hip ap]
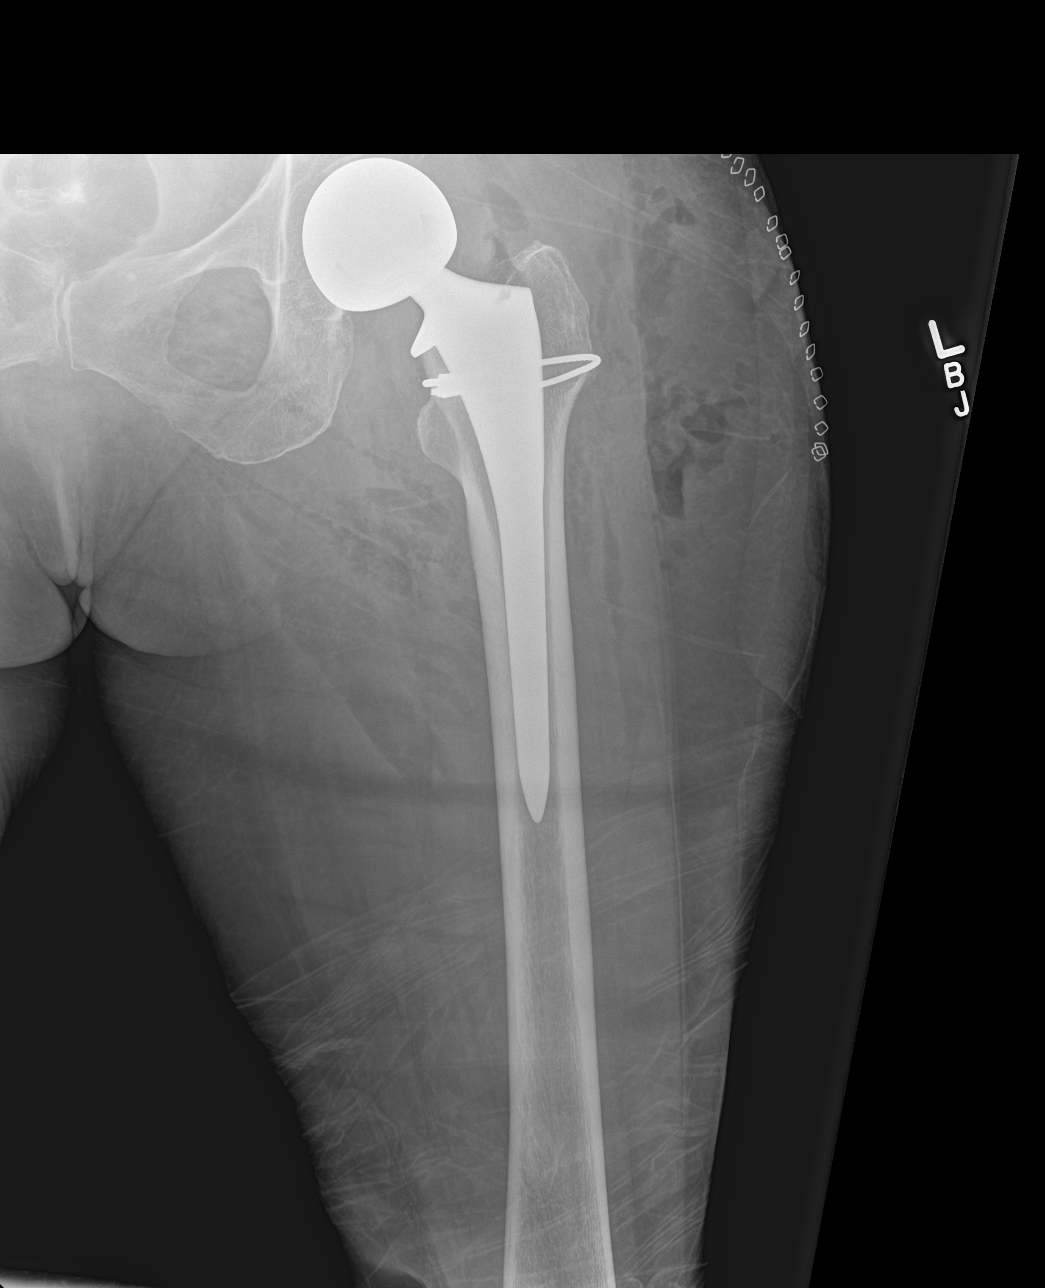

[hip lat]
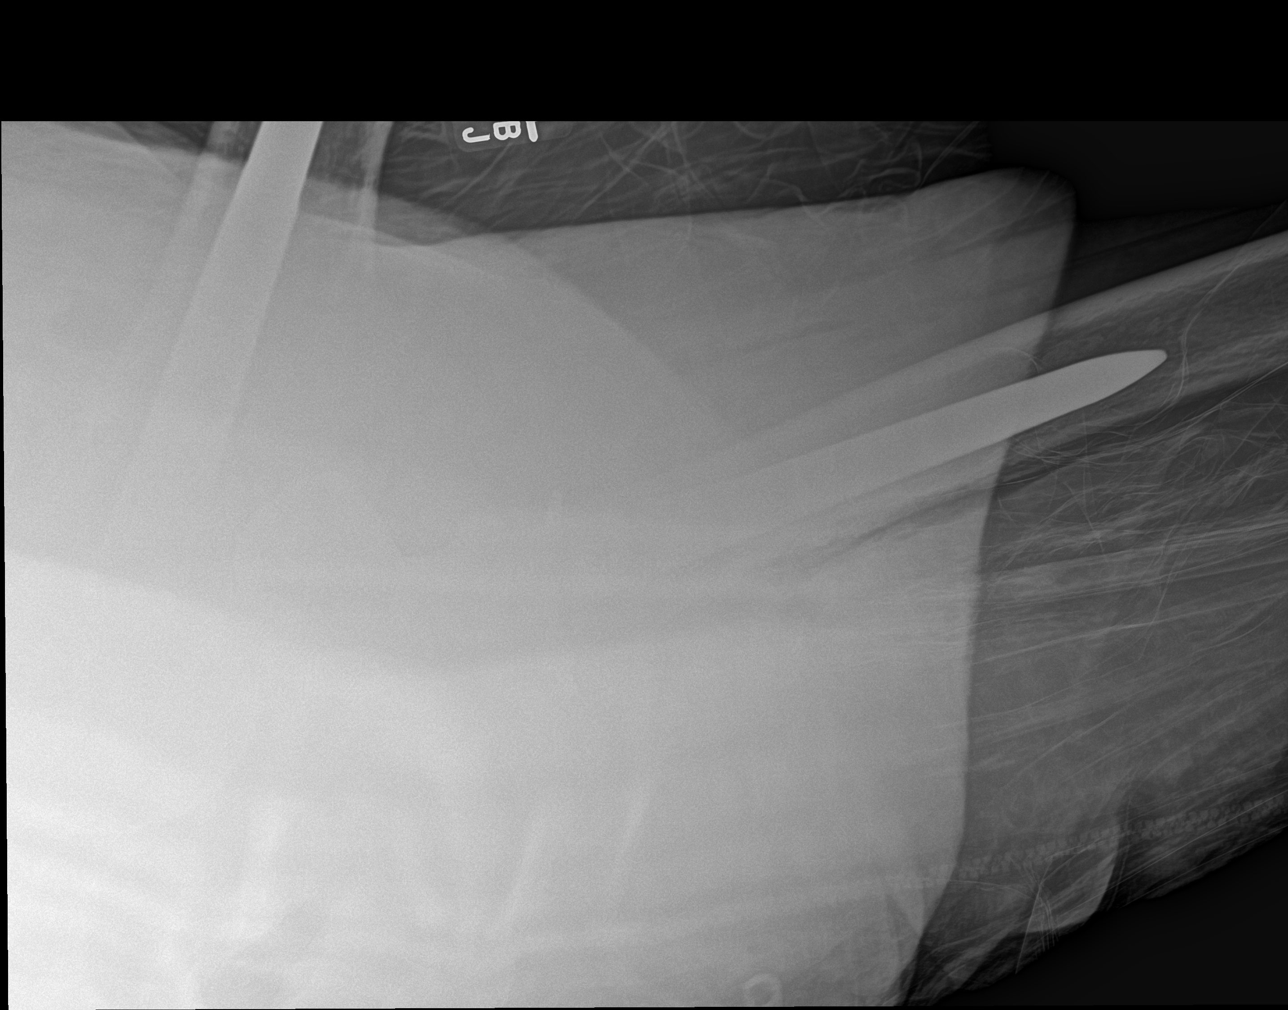

[3 of 3 positions shown; findings below may reference images not displayed]

FINDINGS: New postoperative changes of left hip hemiarthroplasty are noted.
The prosthetic femoral head is located in the acetabulum. Gas is
present in the joint space and overlying soft tissues, and there are
skin staples lateral to the joint space. Bony pelvic ring appears
intact. Right femur as visualized is intact. Right femoral head
appears located on this single view examination.
IMPRESSION: 1. Expected postoperative changes of left hip hemiarthroplasty
without immediate acute complicating features, as above.

## 2024-03-14 DIAGNOSIS — E538 Deficiency of other specified B group vitamins: Secondary | ICD-10-CM | POA: Diagnosis not present

## 2024-04-18 DIAGNOSIS — E538 Deficiency of other specified B group vitamins: Secondary | ICD-10-CM | POA: Diagnosis not present

## 2024-06-17 DIAGNOSIS — H353132 Nonexudative age-related macular degeneration, bilateral, intermediate dry stage: Secondary | ICD-10-CM | POA: Diagnosis not present

## 2024-06-17 DIAGNOSIS — H52203 Unspecified astigmatism, bilateral: Secondary | ICD-10-CM | POA: Diagnosis not present

## 2024-06-17 DIAGNOSIS — H04123 Dry eye syndrome of bilateral lacrimal glands: Secondary | ICD-10-CM | POA: Diagnosis not present

## 2024-07-11 DIAGNOSIS — E538 Deficiency of other specified B group vitamins: Secondary | ICD-10-CM | POA: Diagnosis not present
